# Patient Record
Sex: Male | Born: 1945
Health system: Southern US, Community
[De-identification: ages and names within clinical notes are randomized; demographics above are authoritative.]

## PROBLEM LIST (undated history)

## (undated) DIAGNOSIS — D649 Anemia, unspecified: Secondary | ICD-10-CM

## (undated) DIAGNOSIS — F17200 Nicotine dependence, unspecified, uncomplicated: Secondary | ICD-10-CM

## (undated) DIAGNOSIS — I10 Essential (primary) hypertension: Secondary | ICD-10-CM

## (undated) DIAGNOSIS — J449 Chronic obstructive pulmonary disease, unspecified: Secondary | ICD-10-CM

## (undated) DIAGNOSIS — E7800 Pure hypercholesterolemia, unspecified: Secondary | ICD-10-CM

## (undated) HISTORY — DX: Nicotine dependence, unspecified, uncomplicated: F17.200

## (undated) HISTORY — DX: Essential (primary) hypertension: I10

## (undated) HISTORY — DX: Chronic obstructive pulmonary disease, unspecified: J44.9

---

## 2010-12-03 ENCOUNTER — Other Ambulatory Visit: Payer: Self-pay | Admitting: Family Medicine

## 2010-12-03 DIAGNOSIS — I712 Thoracic aortic aneurysm, without rupture: Secondary | ICD-10-CM

## 2010-12-06 ENCOUNTER — Ambulatory Visit
Admission: RE | Admit: 2010-12-06 | Discharge: 2010-12-06 | Disposition: A | Discharge status: Home / Self Care | Payer: Medicare Other | Source: Ambulatory Visit | Attending: Family Medicine | Admitting: Family Medicine

## 2010-12-06 DIAGNOSIS — I712 Thoracic aortic aneurysm, without rupture: Secondary | ICD-10-CM

## 2010-12-06 MED ORDER — IOHEXOL 300 MG/ML  SOLN
75.0000 mL | Freq: Once | INTRAMUSCULAR | Status: AC | PRN
  Administered 2010-12-06: 75 mL via INTRAVENOUS

## 2013-07-21 ENCOUNTER — Ambulatory Visit (INDEPENDENT_AMBULATORY_CARE_PROVIDER_SITE_OTHER): Payer: Medicare Other | Admitting: Family Medicine

## 2013-07-21 ENCOUNTER — Encounter: Payer: Self-pay | Admitting: Family Medicine

## 2013-07-21 VITALS — BP 220/110 | HR 74 | Temp 97.0°F | Resp 18 | Ht 67.0 in | Wt 186.0 lb

## 2013-07-21 DIAGNOSIS — I160 Hypertensive urgency: Secondary | ICD-10-CM

## 2013-07-21 DIAGNOSIS — F17200 Nicotine dependence, unspecified, uncomplicated: Secondary | ICD-10-CM | POA: Insufficient documentation

## 2013-07-21 DIAGNOSIS — I10 Essential (primary) hypertension: Secondary | ICD-10-CM

## 2013-07-21 MED ORDER — CLONIDINE HCL 0.1 MG PO TABS: 0.1000 mg | ORAL_TABLET | Freq: Two times a day (BID) | ORAL | Status: DC

## 2013-07-21 NOTE — Progress Notes (Signed)
Subjective:    Patient ID: Brett Vasquez, male    DOB: 09-28-1945, 67 y.o.   MRN: [id number]  HPI Patient is a pleasant 67 year old African American male whom I have not seen since 2012. He is not seen a doctor since 2013. He has a history of hypertension. He has been off his medication now for 4 months. He presents today for a scheduled physical exam. Unfortunately his blood pressure is 220/110. He denies any chest pain, shortness of breath, dyspnea on exertion. He denies any headache or blurred vision. He denies any altered mental status or neurologic deficits. He denies any hematuria or oliguria. He feels well. Past Medical History  Diagnosis Date  . COPD (chronic obstructive pulmonary disease)   . Hypertension   . Smoker unmotivated to quit    No current outpatient prescriptions on file prior to visit.   No current facility-administered medications on file prior to visit.   No Known Allergies History   Social History  . Marital Status: Legally Separated    Spouse Name: N/A    Number of Children: N/A  . Years of Education: N/A   Occupational History  . Not on file.   Social History Main Topics  . Smoking status: Current Every Day Smoker -- 1.50 packs/day for 50 years    Types: Cigarettes  . Smokeless tobacco: Not on file  . Alcohol Use: No  . Drug Use: No  . Sexual Activity: Not on file     Comment: divorced, 3 grown children   Other Topics Concern  . Not on file   Social History Narrative  . No narrative on file      Review of Systems  Respiratory: Negative for cough, choking, chest tightness and shortness of breath.   Cardiovascular: Negative for chest pain, palpitations and leg swelling.  Gastrointestinal: Positive for abdominal distention.  Neurological: Negative for dizziness, tremors, seizures, syncope, facial asymmetry, speech difficulty, weakness, light-headedness, numbness and headaches.  All other systems reviewed and are negative.        Objective:   Physical Exam  Vitals reviewed. Constitutional: He is oriented to person, place, and time. He appears well-developed and well-nourished. No distress.  Neck: Neck supple. No JVD present. No thyromegaly present.  Cardiovascular: Normal rate, regular rhythm, normal heart sounds and intact distal pulses.  Exam reveals no gallop and no friction rub.   No murmur heard. Pulmonary/Chest: Effort normal and breath sounds normal. No respiratory distress. He has no wheezes. He has no rales. He exhibits no tenderness.  Abdominal: Soft. Bowel sounds are normal. He exhibits no distension and no mass. There is no tenderness. There is no rebound and no guarding.  Neurological: He is alert and oriented to person, place, and time. He has normal reflexes. He displays normal reflexes. No cranial nerve deficit. He exhibits normal muscle tone. Coordination normal.  Skin: He is not diaphoretic.          Assessment & Plan:  1. Hypertensive urgency EKG shows sinus rhythm at 69 beats per minute with left axis deviation -31. Otherwise he has normal intervals. There nonspecific ST changes in the lateral leads and a left anterior fascicular block but no overt ischemia.  Begin clonidine 0.1 mg by mouth twice a day and recheck blood pressure in one week the emergency room if chest pain or shortness of breath or headaches develop.  His complete physical exam with the delayed until resolution of his blood pressure. He is also due for colonoscopy, prostate  exam, pneumonia vaccine, flu shot. I will also start the patient on Symbicort 160/4.52 puffs inhaled twice a day recheck on Monday.  - COMPLETE METABOLIC PANEL WITH GFR - CBC with Differential - Lipid panel - EKG 12-Lead

## 2013-07-22 LAB — CBC WITH DIFFERENTIAL/PLATELET
Hemoglobin: 13.7 g/dL (ref 13.0–17.0)
Lymphocytes Relative: 35 % (ref 12–46)
Lymphs Abs: 2.4 10*3/uL (ref 0.7–4.0)
Monocytes Relative: 6 % (ref 3–12)
Neutro Abs: 3.8 10*3/uL (ref 1.7–7.7)
Neutrophils Relative %: 55 % (ref 43–77)
Platelets: 289 10*3/uL (ref 150–400)
RBC: 4.67 MIL/uL (ref 4.22–5.81)
WBC: 6.9 10*3/uL (ref 4.0–10.5)

## 2013-07-22 LAB — COMPLETE METABOLIC PANEL WITH GFR
ALT: 10 U/L (ref 0–53)
BUN: 11 mg/dL (ref 6–23)
CO2: 27 mEq/L (ref 19–32)
Calcium: 9.1 mg/dL (ref 8.4–10.5)
Chloride: 106 mEq/L (ref 96–112)
Creat: 0.88 mg/dL (ref 0.50–1.35)
GFR, Est African American: >89 mL/min
Total Bilirubin: 0.5 mg/dL (ref 0.3–1.2)

## 2013-07-22 LAB — LIPID PANEL
Cholesterol: 168 mg/dL (ref 0–200)
HDL: 59 mg/dL (ref >39)
Total CHOL/HDL Ratio: 2.8 Ratio
Triglycerides: 100 mg/dL (ref <150)
VLDL: 20 mg/dL (ref 0–40)

## 2013-07-25 ENCOUNTER — Encounter: Payer: Self-pay | Admitting: Family Medicine

## 2013-07-25 ENCOUNTER — Ambulatory Visit (INDEPENDENT_AMBULATORY_CARE_PROVIDER_SITE_OTHER): Payer: Medicare Other | Admitting: Family Medicine

## 2013-07-25 VITALS — BP 150/100 | HR 66 | Temp 97.9°F | Resp 18 | Wt 182.0 lb

## 2013-07-25 DIAGNOSIS — I10 Essential (primary) hypertension: Secondary | ICD-10-CM

## 2013-07-25 DIAGNOSIS — R0689 Other abnormalities of breathing: Secondary | ICD-10-CM

## 2013-07-25 DIAGNOSIS — R0989 Other specified symptoms and signs involving the circulatory and respiratory systems: Secondary | ICD-10-CM

## 2013-07-25 MED ORDER — HYDROCHLOROTHIAZIDE 25 MG PO TABS: 25.0000 mg | ORAL_TABLET | Freq: Every day | ORAL | Status: DC

## 2013-07-25 NOTE — Progress Notes (Signed)
Subjective:    Patient ID: Brett Vasquez, male    DOB: [birth year], 67 y.o.   MRN: [id number]  HPI 07/21/13 Patient is a pleasant 67 year old African American male whom I have not seen since 2012. He is not seen a doctor since 2013. He has a history of hypertension. He has been off his medication now for 4 months. He presents today for a scheduled physical exam. Unfortunately his blood pressure is 220/110. He denies any chest pain, shortness of breath, dyspnea on exertion. He denies any headache or blurred vision. He denies any altered mental status or neurologic deficits. He denies any hematuria or oliguria. He feels well.  At that time, my plan was: 1. Hypertensive urgency EKG shows sinus rhythm at 69 beats per minute with left axis deviation -31. Otherwise he has normal intervals. There nonspecific ST changes in the lateral leads and a left anterior fascicular block but no overt ischemia.  Begin clonidine 0.1 mg by mouth twice a day and recheck blood pressure in one week the emergency room if chest pain or shortness of breath or headaches develop.  His complete physical exam with the delayed until resolution of his blood pressure. He is also due for colonoscopy, prostate exam, pneumonia vaccine, flu shot. I will also start the patient on Symbicort 160/4.52 puffs inhaled twice a day recheck on Monday.  - COMPLETE METABOLIC PANEL WITH GFR - CBC with Differential - Lipid panel - EKG 12-Lead  07/25/13 Patient is here today for followup he is blood pressures much better at 150/100 although it is still elevated. He does think his breathing is doing better since he started Symbicort. I reviewed the patient's labs with him in detail. His CMP, CBC, and fasting lipid panel were very good. They are listed below: Office Visit on 07/21/2013  Component Date Value Range Status  . Sodium 07/21/2013 141  135 - 145 mEq/L Final  . Potassium 07/21/2013 4.1  3.5 - 5.3 mEq/L Final  . Chloride 07/21/2013 106  96 -  112 mEq/L Final  . CO2 07/21/2013 27  19 - 32 mEq/L Final  . Glucose, Bld 07/21/2013 86  70 - 99 mg/dL Final  . BUN 40/98/1191 11  6 - 23 mg/dL Final  . Creat 47/82/9562 0.88  0.50 - 1.35 mg/dL Final  . Total Bilirubin 07/21/2013 0.5  0.3 - 1.2 mg/dL Final  . Alkaline Phosphatase 07/21/2013 88  39 - 117 U/L Final  . AST 07/21/2013 17  0 - 37 U/L Final  . ALT 07/21/2013 10  0 - 53 U/L Final  . Total Protein 07/21/2013 6.2  6.0 - 8.3 g/dL Final  . Albumin 13/04/6577 4.1  3.5 - 5.2 g/dL Final  . Calcium 46/96/2952 9.1  8.4 - 10.5 mg/dL Final  . GFR, Est African American 07/21/2013 >89   Final  . GFR, Est Non African American 07/21/2013 88   Final   Comment:                            The estimated GFR is a calculation valid for adults (>=24 years old)                          that uses the CKD-EPI algorithm to adjust for age and sex. It is  not to be used for children, pregnant women, hospitalized patients,                             patients on dialysis, or with rapidly changing kidney function.                          According to the NKDEP, eGFR >89 is normal, 60-89 shows mild                          impairment, 30-59 shows moderate impairment, 15-29 shows severe                          impairment and <15 is ESRD.                             . WBC 07/21/2013 6.9  4.0 - 10.5 K/uL Final  . RBC 07/21/2013 4.67  4.22 - 5.81 MIL/uL Final  . Hemoglobin 07/21/2013 13.7  13.0 - 17.0 g/dL Final  . HCT 78/29/5621 41.3  39.0 - 52.0 % Final  . MCV 07/21/2013 88.4  78.0 - 100.0 fL Final  . MCH 07/21/2013 29.3  26.0 - 34.0 pg Final  . MCHC 07/21/2013 33.2  30.0 - 36.0 g/dL Final  . RDW 30/86/5784 15.1  11.5 - 15.5 % Final  . Platelets 07/21/2013 289  150 - 400 K/uL Final  . Neutrophils Relative % 07/21/2013 55  43 - 77 % Final  . Neutro Abs 07/21/2013 3.8  1.7 - 7.7 K/uL Final  . Lymphocytes Relative 07/21/2013 35  12 - 46 % Final  . Lymphs Abs 07/21/2013 2.4  0.7 -  4.0 K/uL Final  . Monocytes Relative 07/21/2013 6  3 - 12 % Final  . Monocytes Absolute 07/21/2013 0.4  0.1 - 1.0 K/uL Final  . Eosinophils Relative 07/21/2013 3  0 - 5 % Final  . Eosinophils Absolute 07/21/2013 0.2  0.0 - 0.7 K/uL Final  . Basophils Relative 07/21/2013 1  0 - 1 % Final  . Basophils Absolute 07/21/2013 0.1  0.0 - 0.1 K/uL Final  . Smear Review 07/21/2013 Criteria for review not met   Final  . Cholesterol 07/21/2013 168  0 - 200 mg/dL Final   Comment: ATP III Classification:                                < 200        mg/dL        Desirable                               200 - 239     mg/dL        Borderline High                               >= 240        mg/dL        High                             . Triglycerides 07/21/2013 100  <  150 mg/dL Final  . HDL 16/06/9603 59  >39 mg/dL Final  . Total CHOL/HDL Ratio 07/21/2013 2.8   Final  . VLDL 07/21/2013 20  0 - 40 mg/dL Final  . LDL Cholesterol 07/21/2013 89  0 - 99 mg/dL Final   Comment:                            Total Cholesterol/HDL Ratio:CHD Risk                                                 Coronary Heart Disease Risk Table                                                                 Men       Women                                   1/2 Average Risk              3.4        3.3                                       Average Risk              5.0        4.4                                    2X Average Risk              9.6        7.1                                    3X Average Risk             23.4       11.0                          Use the calculated Patient Ratio above and the CHD Risk table                           to determine the patient's CHD Risk.                          ATP III Classification (LDL):                                < 100        mg/dL         Optimal  100 - 129     mg/dL         Near or Above Optimal                               130 - 159     mg/dL          Borderline High                               160 - 189     mg/dL         High                                > 190        mg/dL         Very High                                Past Medical History  Diagnosis Date  . COPD (chronic obstructive pulmonary disease)   . Hypertension   . Smoker unmotivated to quit    Current Outpatient Prescriptions on File Prior to Visit  Medication Sig Dispense Refill  . cloNIDine (CATAPRES) 0.1 MG tablet Take 1 tablet (0.1 mg total) by mouth 2 (two) times daily.  60 tablet  3   No current facility-administered medications on file prior to visit.   No Known Allergies History   Social History  . Marital Status: Legally Separated    Spouse Name: N/A    Number of Children: N/A  . Years of Education: N/A   Occupational History  . Not on file.   Social History Main Topics  . Smoking status: Current Every Day Smoker -- 1.50 packs/day for 50 years    Types: Cigarettes  . Smokeless tobacco: Not on file  . Alcohol Use: No  . Drug Use: No  . Sexual Activity: Not on file     Comment: divorced, 3 grown children   Other Topics Concern  . Not on file   Social History Narrative  . No narrative on file      Review of Systems  Respiratory: Negative for cough, choking, chest tightness and shortness of breath.   Cardiovascular: Negative for chest pain, palpitations and leg swelling.  Gastrointestinal: Positive for abdominal distention.  Neurological: Negative for dizziness, tremors, seizures, syncope, facial asymmetry, speech difficulty, weakness, light-headedness, numbness and headaches.  All other systems reviewed and are negative.       Objective:   Physical Exam  Vitals reviewed. Constitutional: He is oriented to person, place, and time. He appears well-developed and well-nourished. No distress.  Neck: Neck supple. No JVD present. No thyromegaly present.  Cardiovascular: Normal rate, regular rhythm, normal heart sounds and intact distal  pulses.  Exam reveals no gallop and no friction rub.   No murmur heard. Pulmonary/Chest: Effort normal and breath sounds normal. No respiratory distress. He has no wheezes. He has no rales. He exhibits no tenderness.  Abdominal: Soft. Bowel sounds are normal. He exhibits no distension and no mass. There is no tenderness. There is no rebound and no guarding.  Neurological: He is alert and oriented to person, place, and time. He has normal reflexes. No cranial nerve deficit. He exhibits normal muscle tone. Coordination normal.  Skin: He is not diaphoretic.   patient has faint bibasilar crackles        Assessment & Plan:  1. HTN (hypertension) Add hydrochlorothiazide 25 mg by mouth daily 2 clonidine and recheck blood pressure in 2 weeks. Also in 2 weeks to give the patient his flu shot, Prevnar 13, and perform a prostate exam. - hydrochlorothiazide (HYDRODIURIL) 25 MG tablet; Take 1 tablet (25 mg total) by mouth daily.  Dispense: 90 tablet; Refill: 3  2. Abnormal respiratory sounds Given his chronic tobacco abuse, check a chest x-ray. - DG Chest 2 View; Future

## 2013-08-09 ENCOUNTER — Ambulatory Visit: Payer: Medicare Other | Admitting: Family Medicine

## 2014-06-06 ENCOUNTER — Telehealth: Payer: Self-pay | Admitting: Family Medicine

## 2014-06-06 NOTE — Telephone Encounter (Signed)
LM pt is needing to schedule [facility] AND CPE

## 2014-06-20 ENCOUNTER — Encounter: Payer: Self-pay | Admitting: Family Medicine

## 2014-06-20 ENCOUNTER — Telehealth: Payer: Self-pay | Admitting: Family Medicine

## 2014-06-20 NOTE — Telephone Encounter (Signed)
Letter sent to pt to call and schedule [facility] CPE °

## 2014-07-28 ENCOUNTER — Telehealth: Payer: Self-pay | Admitting: Family Medicine

## 2014-07-28 DIAGNOSIS — I10 Essential (primary) hypertension: Secondary | ICD-10-CM

## 2014-07-28 MED ORDER — HYDROCHLOROTHIAZIDE 25 MG PO TABS: 25.0000 mg | ORAL_TABLET | Freq: Every day | ORAL | Status: DC

## 2014-07-28 NOTE — Telephone Encounter (Signed)
[phone]  Pt has cpe set up for Dec 3rd but he is needing a refill on his      hydrochlorothiazide (HYDRODIURIL) 25 MG tablet    [facility] [address]

## 2014-07-28 NOTE — Telephone Encounter (Signed)
Med sent to pharm 

## 2014-08-17 ENCOUNTER — Ambulatory Visit (INDEPENDENT_AMBULATORY_CARE_PROVIDER_SITE_OTHER): Payer: Medicare Other | Admitting: Family Medicine

## 2014-08-17 ENCOUNTER — Encounter: Payer: Self-pay | Admitting: Family Medicine

## 2014-08-17 VITALS — BP 130/88 | HR 80 | Temp 97.5°F | Resp 18 | Ht 67.0 in | Wt 199.0 lb

## 2014-08-17 DIAGNOSIS — J439 Emphysema, unspecified: Secondary | ICD-10-CM

## 2014-08-17 DIAGNOSIS — Z0000 Encounter for general adult medical examination without abnormal findings: Secondary | ICD-10-CM

## 2014-08-17 MED ORDER — BUDESONIDE-FORMOTEROL FUMARATE 160-4.5 MCG/ACT IN AERO: 2.0000 | INHALATION_SPRAY | Freq: Two times a day (BID) | RESPIRATORY_TRACT | Status: DC

## 2014-08-17 NOTE — Progress Notes (Signed)
Subjective:    Patient ID: Brett Vasquez, male    DOB: 09-Apr-1946, 68 y.o.   MRN: [id number]  HPI Patient is here today for complete physical exam. He has never had a colonoscopy. He is overdue for a prostate exam. He is due for a flu shot, Pneumovax 23, and the shingles vaccine. He is also due for fasting blood work. His blood pressures well controlled today at 130/88. He is currently only taking hydrochlorothiazide 25 mg by mouth daily. He has been off the clonidine first over 6 months. He denies any chest pain shortness of breath or dyspnea on exertion. Unfortunately he continues to smoke. Patient saw substantial benefit when he took Symbicort 160/4.52 puffs inhaled twice a day for his emphysema. He would like to continue that prescription. Otherwise he has no concerns. Past Medical History  Diagnosis Date  . COPD (chronic obstructive pulmonary disease)   . Hypertension   . Smoker unmotivated to quit    No past surgical history on file. Current Outpatient Prescriptions on File Prior to Visit  Medication Sig Dispense Refill  . hydrochlorothiazide (HYDRODIURIL) 25 MG tablet Take 1 tablet (25 mg total) by mouth daily. 90 tablet 3  . cloNIDine (CATAPRES) 0.1 MG tablet Take 1 tablet (0.1 mg total) by mouth 2 (two) times daily. (Patient not taking: Reported on 08/17/2014) 60 tablet 3   No current facility-administered medications on file prior to visit.   No Known Allergies History   Social History  . Marital Status: Legally Separated    Spouse Name: N/A    Number of Children: N/A  . Years of Education: N/A   Occupational History  . Not on file.   Social History Main Topics  . Smoking status: Current Every Day Smoker -- 1.50 packs/day for 50 years    Types: Cigarettes  . Smokeless tobacco: Not on file  . Alcohol Use: No  . Drug Use: No  . Sexual Activity: Not on file     Comment: divorced, 3 grown children   Other Topics Concern  . Not on file   Social History Narrative    No family history on file.    Review of Systems  All other systems reviewed and are negative.      Objective:   Physical Exam  Constitutional: He is oriented to person, place, and time. He appears well-developed and well-nourished. No distress.  HENT:  Head: Normocephalic and atraumatic.  Right Ear: External ear normal.  Left Ear: External ear normal.  Nose: Nose normal.  Mouth/Throat: Oropharynx is clear and moist. No oropharyngeal exudate.  Eyes: Conjunctivae and EOM are normal. Pupils are equal, round, and reactive to light. Right eye exhibits no discharge. Left eye exhibits no discharge. No scleral icterus.  Neck: Normal range of motion. Neck supple. No JVD present. No tracheal deviation present. No thyromegaly present.  Cardiovascular: Normal rate, regular rhythm, normal heart sounds and intact distal pulses.  Exam reveals no gallop.   No murmur heard. Pulmonary/Chest: Effort normal. No stridor. No respiratory distress. He has decreased breath sounds. He has no wheezes. He has no rales.  Abdominal: Soft. Bowel sounds are normal. He exhibits no distension and no mass. There is no tenderness. There is no rebound and no guarding.  Musculoskeletal: Normal range of motion. He exhibits no edema or tenderness.  Lymphadenopathy:    He has no cervical adenopathy.  Neurological: He is alert and oriented to person, place, and time. He has normal reflexes. He displays normal  reflexes. No cranial nerve deficit. He exhibits normal muscle tone. Coordination normal.  Skin: Skin is warm. No rash noted. He is not diaphoretic. No erythema. No pallor.  Psychiatric: He has a normal mood and affect. His behavior is normal. Judgment and thought content normal.  Vitals reviewed.         Assessment & Plan:  Pulmonary emphysema, unspecified emphysema type - Plan: budesonide-formoterol (SYMBICORT) 160-4.5 MCG/ACT inhaler  Routine general medical examination at a health care facility - Plan:  Ambulatory referral to Gastroenterology  Patient's physical exam today is completely normal. He refuses a flu shot. He refuses a pneumonia vaccine. He is not interested in the shingles vaccine. He refuses a digital rectal exam. He states that he will allow me to schedule him for colonoscopy. I have asked the patient to return fasting for a CBC, CMP, fasting lipid panel, and PSA. I would also like to recheck his blood pressure when he comes in fasting to ensure that it remains well controlled on hydrochlorothiazide alone.  Also recommended smoking cessation but the patient is in the pre-contemplative phase.

## 2014-08-24 ENCOUNTER — Ambulatory Visit: Payer: Medicare Other | Admitting: *Deleted

## 2014-08-24 VITALS — BP 130/82

## 2014-08-24 DIAGNOSIS — I10 Essential (primary) hypertension: Secondary | ICD-10-CM

## 2014-09-19 ENCOUNTER — Encounter: Payer: Self-pay | Admitting: Family Medicine

## 2015-07-17 ENCOUNTER — Other Ambulatory Visit: Payer: Self-pay | Admitting: Family Medicine

## 2015-07-17 DIAGNOSIS — H2513 Age-related nuclear cataract, bilateral: Secondary | ICD-10-CM | POA: Diagnosis not present

## 2015-07-17 DIAGNOSIS — H26053 Posterior subcapsular polar infantile and juvenile cataract, bilateral: Secondary | ICD-10-CM | POA: Diagnosis not present

## 2015-07-17 DIAGNOSIS — H25033 Anterior subcapsular polar age-related cataract, bilateral: Secondary | ICD-10-CM | POA: Diagnosis not present

## 2015-07-18 NOTE — Telephone Encounter (Signed)
Refill appropriate and filled per protocol. 

## 2015-07-31 DIAGNOSIS — H02005 Unspecified entropion of left lower eyelid: Secondary | ICD-10-CM | POA: Diagnosis not present

## 2015-07-31 DIAGNOSIS — H01021 Squamous blepharitis right upper eyelid: Secondary | ICD-10-CM | POA: Diagnosis not present

## 2015-07-31 DIAGNOSIS — H02002 Unspecified entropion of right lower eyelid: Secondary | ICD-10-CM | POA: Diagnosis not present

## 2015-09-04 ENCOUNTER — Ambulatory Visit: Payer: Medicare Other | Admitting: Family Medicine

## 2015-09-04 VITALS — BP 140/90

## 2015-09-04 DIAGNOSIS — J439 Emphysema, unspecified: Secondary | ICD-10-CM

## 2015-09-04 MED ORDER — BUDESONIDE-FORMOTEROL FUMARATE 160-4.5 MCG/ACT IN AERO: 2.0000 | INHALATION_SPRAY | Freq: Two times a day (BID) | RESPIRATORY_TRACT | Status: DC

## 2015-09-11 ENCOUNTER — Other Ambulatory Visit: Payer: Medicaid Other

## 2015-09-11 ENCOUNTER — Other Ambulatory Visit: Payer: Self-pay | Admitting: Family Medicine

## 2015-09-11 DIAGNOSIS — F17210 Nicotine dependence, cigarettes, uncomplicated: Secondary | ICD-10-CM | POA: Diagnosis not present

## 2015-09-11 DIAGNOSIS — I10 Essential (primary) hypertension: Secondary | ICD-10-CM

## 2015-09-11 DIAGNOSIS — Z125 Encounter for screening for malignant neoplasm of prostate: Secondary | ICD-10-CM

## 2015-09-11 DIAGNOSIS — Z0000 Encounter for general adult medical examination without abnormal findings: Secondary | ICD-10-CM | POA: Diagnosis not present

## 2015-09-11 DIAGNOSIS — Z79899 Other long term (current) drug therapy: Secondary | ICD-10-CM

## 2015-09-11 DIAGNOSIS — F17200 Nicotine dependence, unspecified, uncomplicated: Secondary | ICD-10-CM

## 2015-09-11 LAB — COMPLETE METABOLIC PANEL WITH GFR
ALBUMIN: 4 g/dL (ref 3.6–5.1)
ALK PHOS: 85 U/L (ref 40–115)
ALT: 15 U/L (ref 9–46)
AST: 17 U/L (ref 10–35)
BUN: 15 mg/dL (ref 7–25)
CALCIUM: 10.3 mg/dL (ref 8.6–10.3)
CO2: 26 mmol/L (ref 20–31)
CREATININE: 1.18 mg/dL (ref 0.70–1.18)
Chloride: 98 mmol/L (ref 98–110)
GFR, Est African American: 71 mL/min (ref >=60)
GFR, Est Non African American: 62 mL/min (ref >=60)
GLUCOSE: 89 mg/dL (ref 70–99)
Potassium: 3.6 mmol/L (ref 3.5–5.3)
Sodium: 140 mmol/L (ref 135–146)
TOTAL PROTEIN: 6.6 g/dL (ref 6.1–8.1)
Total Bilirubin: 0.7 mg/dL (ref 0.2–1.2)

## 2015-09-11 LAB — CBC WITH DIFFERENTIAL/PLATELET
BASOS ABS: 0.1 10*3/uL (ref 0.0–0.1)
Basophils Relative: 1 % (ref 0–1)
EOS PCT: 2 % (ref 0–5)
Eosinophils Absolute: 0.2 10*3/uL (ref 0.0–0.7)
HEMATOCRIT: 46.8 % (ref 39.0–52.0)
HEMOGLOBIN: 15.7 g/dL (ref 13.0–17.0)
LYMPHS ABS: 2.8 10*3/uL (ref 0.7–4.0)
LYMPHS PCT: 32 % (ref 12–46)
MCH: 29.5 pg (ref 26.0–34.0)
MCHC: 33.5 g/dL (ref 30.0–36.0)
MCV: 88 fL (ref 78.0–100.0)
MPV: 9.1 fL (ref 8.6–12.4)
Monocytes Absolute: 0.6 10*3/uL (ref 0.1–1.0)
Monocytes Relative: 7 % (ref 3–12)
NEUTROS ABS: 5.1 10*3/uL (ref 1.7–7.7)
Neutrophils Relative %: 58 % (ref 43–77)
Platelets: 291 10*3/uL (ref 150–400)
RBC: 5.32 MIL/uL (ref 4.22–5.81)
RDW: 14.7 % (ref 11.5–15.5)
WBC: 8.8 10*3/uL (ref 4.0–10.5)

## 2015-09-11 LAB — LIPID PANEL
CHOL/HDL RATIO: 2.7 ratio (ref <=5.0)
CHOLESTEROL: 170 mg/dL (ref 125–200)
HDL: 63 mg/dL (ref >=40)
LDL Cholesterol: 71 mg/dL (ref <130)
Triglycerides: 181 mg/dL — ABNORMAL HIGH (ref <150)
VLDL: 36 mg/dL — ABNORMAL HIGH (ref <30)

## 2015-09-11 LAB — TSH: TSH: 1.158 u[IU]/mL (ref 0.350–4.500)

## 2015-09-12 LAB — PSA, MEDICARE: PSA: 1.59 ng/mL (ref <=4.00)

## 2015-09-13 ENCOUNTER — Encounter: Payer: Self-pay | Admitting: Family Medicine

## 2015-09-13 ENCOUNTER — Ambulatory Visit (INDEPENDENT_AMBULATORY_CARE_PROVIDER_SITE_OTHER): Payer: Medicare Other | Admitting: Family Medicine

## 2015-09-13 VITALS — BP 152/98 | HR 78 | Temp 97.9°F | Resp 18 | Ht 67.0 in | Wt 195.0 lb

## 2015-09-13 DIAGNOSIS — I10 Essential (primary) hypertension: Secondary | ICD-10-CM | POA: Diagnosis not present

## 2015-09-13 DIAGNOSIS — Z23 Encounter for immunization: Secondary | ICD-10-CM

## 2015-09-13 DIAGNOSIS — Z0000 Encounter for general adult medical examination without abnormal findings: Secondary | ICD-10-CM | POA: Diagnosis not present

## 2015-09-13 MED ORDER — LOSARTAN POTASSIUM 100 MG PO TABS: 100.0000 mg | ORAL_TABLET | Freq: Every day | ORAL | Status: DC

## 2015-09-13 NOTE — Addendum Note (Signed)
Addended by: Shary Decamp B on: 09/13/2015 03:52 PM   Modules accepted: Orders

## 2015-09-13 NOTE — Progress Notes (Signed)
Subjective:    Patient ID: Brett Vasquez, male    DOB: Jun 10, 1946, 69 y.o.   MRN: [id number]  HPI  Patient is here today for complete physical exam. He is overdue for a flu shot, pneumonia vaccine, shingles vaccine. He refuses the flu shot and the shingles vaccine but he is willing to consent to Pneumovax 23. He is overdue for a colonoscopy which he refuses. However we discussed this at great length today and he states that he will consider further. He declines a digital rectal exam however his PSA was reassuring. His blood pressure today is elevated at 152/98. However he denies any other complaints Past Medical History  Diagnosis Date  . COPD (chronic obstructive pulmonary disease) ([place])   . Hypertension   . Smoker unmotivated to quit    No past surgical history on file. Current Outpatient Prescriptions on File Prior to Visit  Medication Sig Dispense Refill  . budesonide-formoterol (SYMBICORT) 160-4.5 MCG/ACT inhaler Inhale 2 puffs into the lungs 2 (two) times daily. 1 Inhaler 11  . hydrochlorothiazide (HYDRODIURIL) 25 MG tablet TAKE 1 TABLET (25 MG TOTAL) BY MOUTH DAILY. 90 tablet 0   No current facility-administered medications on file prior to visit.   No Known Allergies Social History   Social History  . Marital Status: Legally Separated    Spouse Name: N/A  . Number of Children: N/A  . Years of Education: N/A   Occupational History  . Not on file.   Social History Main Topics  . Smoking status: Current Every Day Smoker -- 1.50 packs/day for 50 years    Types: Cigarettes  . Smokeless tobacco: Not on file  . Alcohol Use: No  . Drug Use: No  . Sexual Activity: Not on file     Comment: divorced, 3 grown children   Other Topics Concern  . Not on file   Social History Narrative   No family history on file.    Review of Systems  All other systems reviewed and are negative.      Objective:   Physical Exam  Constitutional: He is oriented to person, place, and  time. He appears well-developed and well-nourished. No distress.  HENT:  Head: Normocephalic and atraumatic.  Right Ear: External ear normal.  Left Ear: External ear normal.  Nose: Nose normal.  Mouth/Throat: Oropharynx is clear and moist. No oropharyngeal exudate.  Eyes: Conjunctivae and EOM are normal. Pupils are equal, round, and reactive to light. Right eye exhibits no discharge. Left eye exhibits no discharge. No scleral icterus.  Neck: Normal range of motion. Neck supple. No JVD present. No tracheal deviation present. No thyromegaly present.  Cardiovascular: Normal rate, regular rhythm, normal heart sounds and intact distal pulses.  Exam reveals no gallop.   No murmur heard. Pulmonary/Chest: Effort normal. No stridor. No respiratory distress. He has decreased breath sounds. He has no wheezes. He has no rales.  Abdominal: Soft. Bowel sounds are normal. He exhibits no distension and no mass. There is no tenderness. There is no rebound and no guarding.  Musculoskeletal: Normal range of motion. He exhibits no edema or tenderness.  Lymphadenopathy:    He has no cervical adenopathy.  Neurological: He is alert and oriented to person, place, and time. He has normal reflexes. No cranial nerve deficit. He exhibits normal muscle tone. Coordination normal.  Skin: Skin is warm. No rash noted. He is not diaphoretic. No erythema. No pallor.  Psychiatric: He has a normal mood and affect. His behavior is  normal. Judgment and thought content normal.  Vitals reviewed.         Assessment & Plan:  Benign essential HTN - Plan: losartan (COZAAR) 100 MG tablet  Routine general medical examination at a health care facility  Patient's physical exam today is completely normal. He refuses a flu shot. However he allow me to give him Pneumovax 23. He is not interested in the shingles vaccine. He refuses a digital rectal exam. He will consider colonoscopy however he does not want me to schedule it at the  present time. I have reviewed his lab work which is listed below and all of which are excellent.   I have asked the patient to return in one month for recheck of his blood pressure after the addition of losartan 100 mg by mouth daily. I will again recommend a flu shot at that time and hopefully I can convince him then to receive it  Also recommended smoking cessation but the patient is in the pre-contemplative phase.

## 2016-05-02 ENCOUNTER — Telehealth: Payer: Self-pay | Admitting: Family Medicine

## 2016-05-02 NOTE — Telephone Encounter (Signed)
PATIENT SAYS LOSARTIN IS MAKING HIM DIZZY, PLEASE CALL AND ADVISE WHAT HE COULD TAKE INSTEAD  [phone] [facility]

## 2016-05-05 ENCOUNTER — Encounter: Payer: Self-pay | Admitting: Family Medicine

## 2016-05-05 ENCOUNTER — Ambulatory Visit (INDEPENDENT_AMBULATORY_CARE_PROVIDER_SITE_OTHER): Payer: Medicare Other | Admitting: Family Medicine

## 2016-05-05 VITALS — BP 180/110 | HR 80 | Temp 97.8°F | Resp 18 | Wt 192.0 lb

## 2016-05-05 DIAGNOSIS — I10 Essential (primary) hypertension: Secondary | ICD-10-CM | POA: Diagnosis not present

## 2016-05-05 MED ORDER — AMLODIPINE BESYLATE 10 MG PO TABS: 10.0000 mg | ORAL_TABLET | Freq: Every day | ORAL | 3 refills | Status: DC

## 2016-05-05 NOTE — Progress Notes (Signed)
Subjective:    Patient ID: Brett Vasquez, male    DOB: 08-06-1946, 70 y.o.   MRN: [id number]  HPI  12/16 Patient is here today for complete physical exam. He is overdue for a flu shot, pneumonia vaccine, shingles vaccine. He refuses the flu shot and the shingles vaccine but he is willing to consent to Pneumovax 23. He is overdue for a colonoscopy which he refuses. However we discussed this at great length today and he states that he will consider further. He declines a digital rectal exam however his PSA was reassuring. His blood pressure today is elevated at 152/98. However he denies any other complaints.  At that time, my plan was: Patient's physical exam today is completely normal. He refuses a flu shot. However he allow me to give him Pneumovax 23. He is not interested in the shingles vaccine. He refuses a digital rectal exam. He will consider colonoscopy however he does not want me to schedule it at the present time. I have reviewed his lab work which is listed below and all of which are excellent.   I have asked the patient to return in one month for recheck of his blood pressure after the addition of losartan 100 mg by mouth daily. I will again recommend a flu shot at that time and hopefully I can convince him then to receive it  Also recommended smoking cessation but the patient is in the pre-contemplative phase.  05/05/16 Patient has not been seen since that time. He called complaining of dizziness. He stated losartan was making him dizzy even though he been on the medication for 8 months. Therefore I asked the patient come in. He is actually stop losartan more than a week ago. His blood pressure today is extremely high. He denies any chest pain shortness of breath or dyspnea on exertion. He does admit to excessive salt use. He jokingly states that he puts salt on country ham. He also eats a lot of pork. He is compliant taking his hydrochlorothiazide but it is obviously insufficient to manage  his blood pressure. He is no longer on losartan Past Medical History:  Diagnosis Date  . COPD (chronic obstructive pulmonary disease) ([place])   . Hypertension   . Smoker unmotivated to quit    No past surgical history on file. Current Outpatient Prescriptions on File Prior to Visit  Medication Sig Dispense Refill  . budesonide-formoterol (SYMBICORT) 160-4.5 MCG/ACT inhaler Inhale 2 puffs into the lungs 2 (two) times daily. 1 Inhaler 11  . hydrochlorothiazide (HYDRODIURIL) 25 MG tablet TAKE 1 TABLET (25 MG TOTAL) BY MOUTH DAILY. 90 tablet 0  . losartan (COZAAR) 100 MG tablet Take 1 tablet (100 mg total) by mouth daily. (Patient not taking: Reported on 05/05/2016) 90 tablet 3   No current facility-administered medications on file prior to visit.    No Known Allergies Social History   Social History  . Marital status: Legally Separated    Spouse name: N/A  . Number of children: N/A  . Years of education: N/A   Occupational History  . Not on file.   Social History Main Topics  . Smoking status: Current Every Day Smoker    Packs/day: 1.50    Years: 50.00    Types: Cigarettes  . Smokeless tobacco: Not on file  . Alcohol use No  . Drug use: No  . Sexual activity: Not on file     Comment: divorced, 3 grown children   Other Topics Concern  .  Not on file   Social History Narrative  . No narrative on file   No family history on file.    Review of Systems  All other systems reviewed and are negative.      Objective:   Physical Exam  Constitutional: He is oriented to person, place, and time. He appears well-developed and well-nourished. No distress.  HENT:  Head: Normocephalic and atraumatic.  Right Ear: External ear normal.  Left Ear: External ear normal.  Nose: Nose normal.  Mouth/Throat: Oropharynx is clear and moist. No oropharyngeal exudate.  Eyes: Conjunctivae and EOM are normal. Pupils are equal, round, and reactive to light. Right eye exhibits no discharge. Left  eye exhibits no discharge. No scleral icterus.  Neck: Normal range of motion. Neck supple. No JVD present. No tracheal deviation present. No thyromegaly present.  Cardiovascular: Normal rate, regular rhythm, normal heart sounds and intact distal pulses.  Exam reveals no gallop.   No murmur heard. Pulmonary/Chest: Effort normal. No stridor. No respiratory distress. He has decreased breath sounds. He has no wheezes. He has no rales.  Abdominal: Soft. Bowel sounds are normal. He exhibits no distension and no mass. There is no tenderness. There is no rebound and no guarding.  Musculoskeletal: Normal range of motion. He exhibits no edema or tenderness.  Lymphadenopathy:    He has no cervical adenopathy.  Neurological: He is alert and oriented to person, place, and time. He has normal reflexes. No cranial nerve deficit. He exhibits normal muscle tone. Coordination normal.  Skin: Skin is warm. No rash noted. He is not diaphoretic. No erythema. No pallor.  Psychiatric: He has a normal mood and affect. His behavior is normal. Judgment and thought content normal.  Vitals reviewed.         Assessment & Plan:  Benign essential HTN - Plan: amLODipine (NORVASC) 10 MG tablet, CBC with Differential/Platelet, COMPLETE METABOLIC PANEL WITH GFR  Continue hydrochlorothiazide. Supplement with amlodipine 10 mg by mouth daily. Recheck blood pressure in one month. Monitor renal function and baseline lab work today

## 2016-05-05 NOTE — Telephone Encounter (Signed)
Has been on medicine since 12/16 and hasn't been seen since.  Doubt it would just now cause dizziness.  NTBS.

## 2016-05-06 LAB — COMPLETE METABOLIC PANEL WITH GFR
ALT: 11 U/L (ref 9–46)
AST: 18 U/L (ref 10–35)
Albumin: 4.4 g/dL (ref 3.6–5.1)
Alkaline Phosphatase: 93 U/L (ref 40–115)
BUN: 15 mg/dL (ref 7–25)
CALCIUM: 9.5 mg/dL (ref 8.6–10.3)
CHLORIDE: 103 mmol/L (ref 98–110)
CO2: 27 mmol/L (ref 20–31)
CREATININE: 1.18 mg/dL (ref 0.70–1.18)
GFR, EST AFRICAN AMERICAN: 71 mL/min (ref >=60)
GFR, Est Non African American: 61 mL/min (ref >=60)
Glucose, Bld: 76 mg/dL (ref 70–99)
POTASSIUM: 3.8 mmol/L (ref 3.5–5.3)
Sodium: 142 mmol/L (ref 135–146)
Total Bilirubin: 0.9 mg/dL (ref 0.2–1.2)
Total Protein: 7.1 g/dL (ref 6.1–8.1)

## 2016-05-06 LAB — CBC WITH DIFFERENTIAL/PLATELET
BASOS PCT: 1 %
Basophils Absolute: 75 cells/uL (ref 0–200)
Eosinophils Absolute: 150 cells/uL (ref 15–500)
Eosinophils Relative: 2 %
HCT: 45.4 % (ref 38.5–50.0)
Hemoglobin: 14.8 g/dL (ref 13.0–17.0)
LYMPHS PCT: 28 %
Lymphs Abs: 2100 cells/uL (ref 850–3900)
MCH: 28.7 pg (ref 27.0–33.0)
MCHC: 32.6 g/dL (ref 32.0–36.0)
MCV: 88 fL (ref 80.0–100.0)
MONOS PCT: 8 %
MPV: 9.6 fL (ref 7.5–12.5)
Monocytes Absolute: 600 cells/uL (ref 200–950)
NEUTROS PCT: 61 %
Neutro Abs: 4575 cells/uL (ref 1500–7800)
PLATELETS: 277 10*3/uL (ref 140–400)
RBC: 5.16 MIL/uL (ref 4.20–5.80)
RDW: 16.2 % — AB (ref 11.0–15.0)
WBC: 7.5 10*3/uL (ref 3.8–10.8)

## 2016-05-07 NOTE — Telephone Encounter (Signed)
Pt was seen in ov on 05/05/16

## 2016-06-05 ENCOUNTER — Ambulatory Visit (INDEPENDENT_AMBULATORY_CARE_PROVIDER_SITE_OTHER): Payer: Medicare Other | Admitting: Family Medicine

## 2016-06-05 ENCOUNTER — Encounter: Payer: Self-pay | Admitting: Family Medicine

## 2016-06-05 DIAGNOSIS — I10 Essential (primary) hypertension: Secondary | ICD-10-CM | POA: Diagnosis not present

## 2016-06-05 MED ORDER — HYDROCHLOROTHIAZIDE 25 MG PO TABS: ORAL_TABLET | ORAL | 3 refills | Status: DC

## 2016-06-05 MED ORDER — SILDENAFIL CITRATE 100 MG PO TABS: 50.0000 mg | ORAL_TABLET | Freq: Every day | ORAL | 11 refills | Status: DC | PRN

## 2016-06-05 MED ORDER — AMLODIPINE BESYLATE 10 MG PO TABS: 10.0000 mg | ORAL_TABLET | Freq: Every day | ORAL | 3 refills | Status: DC

## 2016-06-05 NOTE — Progress Notes (Signed)
Subjective:    Patient ID: Brett Vasquez, male    DOB: 11-Oct-1945, 70 y.o.   MRN: [id number]  HPI  12/16 Patient is here today for complete physical exam. He is overdue for a flu shot, pneumonia vaccine, shingles vaccine. He refuses the flu shot and the shingles vaccine but he is willing to consent to Pneumovax 23. He is overdue for a colonoscopy which he refuses. However we discussed this at great length today and he states that he will consider further. He declines a digital rectal exam however his PSA was reassuring. His blood pressure today is elevated at 152/98. However he denies any other complaints.  At that time, my plan was: Patient's physical exam today is completely normal. He refuses a flu shot. However he allow me to give him Pneumovax 23. He is not interested in the shingles vaccine. He refuses a digital rectal exam. He will consider colonoscopy however he does not want me to schedule it at the present time. I have reviewed his lab work which is listed below and all of which are excellent.   I have asked the patient to return in one month for recheck of his blood pressure after the addition of losartan 100 mg by mouth daily. I will again recommend a flu shot at that time and hopefully I can convince him then to receive it  Also recommended smoking cessation but the patient is in the pre-contemplative phase.  05/05/16 Patient has not been seen since that time. He called complaining of dizziness. He stated losartan was making him dizzy even though he been on the medication for 8 months. Therefore I asked the patient come in. He is actually stop losartan more than a week ago. His blood pressure today is extremely high. He denies any chest pain shortness of breath or dyspnea on exertion. He does admit to excessive salt use. He jokingly states that he puts salt on country ham. He also eats a lot of pork. He is compliant taking his hydrochlorothiazide but it is obviously insufficient to manage  his blood pressure. He is no longer on losartan.  At that time, my plan was: Continue hydrochlorothiazide. Supplement with amlodipine 10 mg by mouth daily. Recheck blood pressure in one month. Monitor renal function and baseline lab work today   06/05/16 Blood pressure is much better. He denies any side effects from amlodipine or the hydrochlorothiazide. He denies any chest pain shortness of breath or dyspnea on exertion. Past Medical History:  Diagnosis Date  . COPD (chronic obstructive pulmonary disease) ([place])   . Hypertension   . Smoker unmotivated to quit    No past surgical history on file. Current Outpatient Prescriptions on File Prior to Visit  Medication Sig Dispense Refill  . budesonide-formoterol (SYMBICORT) 160-4.5 MCG/ACT inhaler Inhale 2 puffs into the lungs 2 (two) times daily. 1 Inhaler 11   No current facility-administered medications on file prior to visit.    No Known Allergies Social History   Social History  . Marital status: Legally Separated    Spouse name: N/A  . Number of children: N/A  . Years of education: N/A   Occupational History  . Not on file.   Social History Main Topics  . Smoking status: Current Every Day Smoker    Packs/day: 1.50    Years: 50.00    Types: Cigarettes  . Smokeless tobacco: Not on file  . Alcohol use No  . Drug use: No  . Sexual activity: Not on  file     Comment: divorced, 3 grown children   Other Topics Concern  . Not on file   Social History Narrative  . No narrative on file   No family history on file.    Review of Systems  All other systems reviewed and are negative.      Objective:   Physical Exam  Constitutional: He is oriented to person, place, and time. He appears well-developed and well-nourished. No distress.  HENT:  Head: Normocephalic and atraumatic.  Right Ear: External ear normal.  Left Ear: External ear normal.  Nose: Nose normal.  Mouth/Throat: Oropharynx is clear and moist. No  oropharyngeal exudate.  Eyes: Conjunctivae and EOM are normal. Pupils are equal, round, and reactive to light. Right eye exhibits no discharge. Left eye exhibits no discharge. No scleral icterus.  Neck: Normal range of motion. Neck supple. No JVD present. No tracheal deviation present. No thyromegaly present.  Cardiovascular: Normal rate, regular rhythm, normal heart sounds and intact distal pulses.  Exam reveals no gallop.   No murmur heard. Pulmonary/Chest: Effort normal. No stridor. No respiratory distress. He has decreased breath sounds. He has no wheezes. He has no rales.  Abdominal: Soft. Bowel sounds are normal. He exhibits no distension and no mass. There is no tenderness. There is no rebound and no guarding.  Musculoskeletal: Normal range of motion. He exhibits no edema or tenderness.  Lymphadenopathy:    He has no cervical adenopathy.  Neurological: He is alert and oriented to person, place, and time. He has normal reflexes. No cranial nerve deficit. He exhibits normal muscle tone. Coordination normal.  Skin: Skin is warm. No rash noted. He is not diaphoretic. No erythema. No pallor.  Psychiatric: He has a normal mood and affect. His behavior is normal. Judgment and thought content normal.  Vitals reviewed.         Assessment & Plan:  Benign essential HTN - Plan: amLODipine (NORVASC) 10 MG tablet Blood pressure is now at goal. Continue amlodipine and hydrochlorothiazide. Return for physical exam in January

## 2016-06-07 ENCOUNTER — Encounter: Payer: Self-pay | Admitting: Family Medicine

## 2016-08-13 ENCOUNTER — Other Ambulatory Visit: Payer: Self-pay | Admitting: Family Medicine

## 2016-08-13 DIAGNOSIS — J439 Emphysema, unspecified: Secondary | ICD-10-CM

## 2016-08-13 NOTE — Telephone Encounter (Signed)
Medication refilled per protocol. 

## 2016-08-19 ENCOUNTER — Encounter: Payer: Self-pay | Admitting: Family Medicine

## 2016-09-16 ENCOUNTER — Other Ambulatory Visit: Payer: Self-pay | Admitting: *Deleted

## 2016-09-16 DIAGNOSIS — J439 Emphysema, unspecified: Secondary | ICD-10-CM

## 2016-09-16 MED ORDER — BUDESONIDE-FORMOTEROL FUMARATE 160-4.5 MCG/ACT IN AERO: 2.0000 | INHALATION_SPRAY | Freq: Two times a day (BID) | RESPIRATORY_TRACT | 3 refills | Status: DC

## 2016-09-16 NOTE — Telephone Encounter (Signed)
Received fax requesting refill on Symbicot.  Refill appropriate and filled per protocol.

## 2016-09-23 ENCOUNTER — Encounter: Payer: Self-pay | Admitting: Family Medicine

## 2017-05-08 ENCOUNTER — Telehealth: Payer: Self-pay | Admitting: Internal Medicine

## 2017-05-08 NOTE — Telephone Encounter (Signed)
Patient is on the 2018 [facility] Patient Reassign for the [facility] Priority list.  [facility] asks that we contact the patient because Dr. Verlene Mayer name is on the patient's ID card.  Patient has been seeing Dr. Jenna Luo, however, because Dr. Verlene Mayer name is on the card, we have to contact the patient in order to confirm if the patient wants to have Dr. Renold Genta as his PCP or not.    I spoke with the patient this morning and confirmed that he is in fact seeing Dr. Dennard Schaumann.  He has noticed that Dr. Renold Genta is on his card, and has been this entire time.  He does NOT want to change to Dr. Renold Genta, he wants to continue to see Dr. Dennard Schaumann.  I told him that I wanted to help him make that happen.  In order to do so, I'll make the change on our side to Dr. Dennard Schaumann and we need him to contact [facility] and request that they change it in their system to Dr. Dennard Schaumann.  And, they will then send him a new card with Dr. Dennard Schaumann on his card.  Patient stated that he would do that.    Call #1.

## 2017-05-23 ENCOUNTER — Other Ambulatory Visit: Payer: Self-pay | Admitting: Family Medicine

## 2017-07-20 ENCOUNTER — Other Ambulatory Visit: Payer: Self-pay | Admitting: Family Medicine

## 2017-07-20 DIAGNOSIS — I10 Essential (primary) hypertension: Secondary | ICD-10-CM

## 2017-09-20 ENCOUNTER — Other Ambulatory Visit: Payer: Self-pay | Admitting: Family Medicine

## 2017-09-20 DIAGNOSIS — J439 Emphysema, unspecified: Secondary | ICD-10-CM

## 2017-11-09 ENCOUNTER — Emergency Department (HOSPITAL_COMMUNITY): Payer: Medicare Other

## 2017-11-09 ENCOUNTER — Observation Stay (HOSPITAL_COMMUNITY)
Admission: EM | Admit: 2017-11-09 | Discharge: 2017-11-11 | Disposition: A | Discharge status: Home / Self Care | Payer: Medicare Other | Attending: Family Medicine | Admitting: Family Medicine

## 2017-11-09 ENCOUNTER — Observation Stay (HOSPITAL_COMMUNITY): Payer: Medicare Other

## 2017-11-09 DIAGNOSIS — Z79899 Other long term (current) drug therapy: Secondary | ICD-10-CM | POA: Insufficient documentation

## 2017-11-09 DIAGNOSIS — R4701 Aphasia: Secondary | ICD-10-CM | POA: Insufficient documentation

## 2017-11-09 DIAGNOSIS — R29810 Facial weakness: Secondary | ICD-10-CM | POA: Diagnosis not present

## 2017-11-09 DIAGNOSIS — I6789 Other cerebrovascular disease: Secondary | ICD-10-CM | POA: Diagnosis not present

## 2017-11-09 DIAGNOSIS — R4781 Slurred speech: Secondary | ICD-10-CM | POA: Diagnosis not present

## 2017-11-09 DIAGNOSIS — E872 Acidosis, unspecified: Secondary | ICD-10-CM | POA: Diagnosis present

## 2017-11-09 DIAGNOSIS — G459 Transient cerebral ischemic attack, unspecified: Principal | ICD-10-CM

## 2017-11-09 DIAGNOSIS — W19XXXA Unspecified fall, initial encounter: Secondary | ICD-10-CM | POA: Diagnosis not present

## 2017-11-09 DIAGNOSIS — Y92009 Unspecified place in unspecified non-institutional (private) residence as the place of occurrence of the external cause: Secondary | ICD-10-CM

## 2017-11-09 DIAGNOSIS — R569 Unspecified convulsions: Secondary | ICD-10-CM

## 2017-11-09 DIAGNOSIS — R4182 Altered mental status, unspecified: Secondary | ICD-10-CM | POA: Diagnosis present

## 2017-11-09 DIAGNOSIS — I444 Left anterior fascicular block: Secondary | ICD-10-CM | POA: Diagnosis not present

## 2017-11-09 DIAGNOSIS — R0602 Shortness of breath: Secondary | ICD-10-CM | POA: Diagnosis not present

## 2017-11-09 DIAGNOSIS — I10 Essential (primary) hypertension: Secondary | ICD-10-CM | POA: Diagnosis not present

## 2017-11-09 DIAGNOSIS — R29818 Other symptoms and signs involving the nervous system: Secondary | ICD-10-CM | POA: Diagnosis not present

## 2017-11-09 DIAGNOSIS — G9341 Metabolic encephalopathy: Secondary | ICD-10-CM | POA: Diagnosis not present

## 2017-11-09 DIAGNOSIS — J449 Chronic obstructive pulmonary disease, unspecified: Secondary | ICD-10-CM | POA: Diagnosis not present

## 2017-11-09 LAB — RAPID URINE DRUG SCREEN, HOSP PERFORMED
Amphetamines: NOT DETECTED
BENZODIAZEPINES: POSITIVE — AB
Barbiturates: NOT DETECTED
COCAINE: NOT DETECTED
OPIATES: NOT DETECTED
Tetrahydrocannabinol: NOT DETECTED

## 2017-11-09 LAB — COMPREHENSIVE METABOLIC PANEL
ALBUMIN: 4 g/dL (ref 3.5–5.0)
ALT: 14 U/L — ABNORMAL LOW (ref 17–63)
ANION GAP: 19 — AB (ref 5–15)
AST: 38 U/L (ref 15–41)
Alkaline Phosphatase: 101 U/L (ref 38–126)
BUN: 18 mg/dL (ref 6–20)
CO2: 15 mmol/L — ABNORMAL LOW (ref 22–32)
Calcium: 9.4 mg/dL (ref 8.9–10.3)
Chloride: 106 mmol/L (ref 101–111)
Creatinine, Ser: 1.37 mg/dL — ABNORMAL HIGH (ref 0.61–1.24)
GFR, EST AFRICAN AMERICAN: 57 mL/min — AB (ref >60)
GFR, EST NON AFRICAN AMERICAN: 50 mL/min — AB (ref >60)
GLUCOSE: 116 mg/dL — AB (ref 65–99)
POTASSIUM: 3.8 mmol/L (ref 3.5–5.1)
Sodium: 140 mmol/L (ref 135–145)
TOTAL PROTEIN: 6.5 g/dL (ref 6.5–8.1)
Total Bilirubin: 0.8 mg/dL (ref 0.3–1.2)

## 2017-11-09 LAB — I-STAT CHEM 8, ED
BUN: 19 mg/dL (ref 6–20)
Calcium, Ion: 1.09 mmol/L — ABNORMAL LOW (ref 1.15–1.40)
Chloride: 106 mmol/L (ref 101–111)
Creatinine, Ser: 1.1 mg/dL (ref 0.61–1.24)
Glucose, Bld: 107 mg/dL — ABNORMAL HIGH (ref 65–99)
HEMATOCRIT: 49 % (ref 39.0–52.0)
HEMOGLOBIN: 16.7 g/dL (ref 13.0–17.0)
POTASSIUM: 3.7 mmol/L (ref 3.5–5.1)
SODIUM: 141 mmol/L (ref 135–145)
TCO2: 18 mmol/L — ABNORMAL LOW (ref 22–32)

## 2017-11-09 LAB — PROTIME-INR
INR: 1.1
PROTHROMBIN TIME: 14.1 s (ref 11.4–15.2)

## 2017-11-09 LAB — APTT: aPTT: 32 seconds (ref 24–36)

## 2017-11-09 LAB — CBC
HCT: 46.6 % (ref 39.0–52.0)
HEMOGLOBIN: 15.3 g/dL (ref 13.0–17.0)
MCH: 29.3 pg (ref 26.0–34.0)
MCHC: 32.8 g/dL (ref 30.0–36.0)
MCV: 89.1 fL (ref 78.0–100.0)
PLATELETS: 257 10*3/uL (ref 150–400)
RBC: 5.23 MIL/uL (ref 4.22–5.81)
RDW: 14.5 % (ref 11.5–15.5)
WBC: 9.3 10*3/uL (ref 4.0–10.5)

## 2017-11-09 LAB — DIFFERENTIAL
Basophils Absolute: 0.1 10*3/uL (ref 0.0–0.1)
Basophils Relative: 1 %
EOS ABS: 0.2 10*3/uL (ref 0.0–0.7)
EOS PCT: 2 %
LYMPHS ABS: 3 10*3/uL (ref 0.7–4.0)
Lymphocytes Relative: 32 %
MONO ABS: 0.7 10*3/uL (ref 0.1–1.0)
Monocytes Relative: 7 %
NEUTROS PCT: 58 %
Neutro Abs: 5.3 10*3/uL (ref 1.7–7.7)

## 2017-11-09 LAB — ETHANOL

## 2017-11-09 LAB — AMMONIA: Ammonia: 27 umol/L (ref 9–35)

## 2017-11-09 LAB — CBG MONITORING, ED: GLUCOSE-CAPILLARY: 113 mg/dL — AB (ref 65–99)

## 2017-11-09 MED ORDER — ACETAMINOPHEN 650 MG RE SUPP: 650.0000 mg | RECTAL | Status: DC | PRN

## 2017-11-09 MED ORDER — ACETAMINOPHEN 160 MG/5ML PO SOLN: 650.0000 mg | ORAL | Status: DC | PRN

## 2017-11-09 MED ORDER — ENOXAPARIN SODIUM 40 MG/0.4ML ~~LOC~~ SOLN
40.0000 mg | SUBCUTANEOUS | Status: DC
  Administered 2017-11-09: 40 mg via SUBCUTANEOUS
  Filled 2017-11-09 (×3): qty 0.4

## 2017-11-09 MED ORDER — ASPIRIN 300 MG RE SUPP: 300.0000 mg | Freq: Every day | RECTAL | Status: DC

## 2017-11-09 MED ORDER — MOMETASONE FURO-FORMOTEROL FUM 200-5 MCG/ACT IN AERO
2.0000 | INHALATION_SPRAY | Freq: Two times a day (BID) | RESPIRATORY_TRACT | Status: DC
  Administered 2017-11-10 (×2): 2 via RESPIRATORY_TRACT
  Filled 2017-11-09: qty 8.8

## 2017-11-09 MED ORDER — ASPIRIN 325 MG PO TABS
325.0000 mg | ORAL_TABLET | Freq: Every day | ORAL | Status: DC
  Administered 2017-11-09 – 2017-11-10 (×2): 325 mg via ORAL
  Filled 2017-11-09 (×2): qty 1

## 2017-11-09 MED ORDER — SENNOSIDES-DOCUSATE SODIUM 8.6-50 MG PO TABS
1.0000 | ORAL_TABLET | Freq: Every evening | ORAL | Status: DC | PRN
Start: 2017-11-09 — End: 2017-11-11

## 2017-11-09 MED ORDER — STROKE: EARLY STAGES OF RECOVERY BOOK
Freq: Once | Status: AC
  Administered 2017-11-10: 04:00:00
  Filled 2017-11-09: qty 1

## 2017-11-09 MED ORDER — SODIUM CHLORIDE 0.9 % IV SOLN
INTRAVENOUS | Status: DC
  Administered 2017-11-09: 22:00:00 via INTRAVENOUS

## 2017-11-09 MED ORDER — ACETAMINOPHEN 325 MG PO TABS: 650.0000 mg | ORAL_TABLET | ORAL | Status: DC | PRN

## 2017-11-09 NOTE — H&P (Signed)
[facility] History and Physical   Patient: Brett Vasquez [id number]   PCP: Patient, No Pcp Per DOB: 02-24-1946   DOA: 11/09/2017   DOS: 11/09/2017   DOS: the patient was seen and examined on 11/09/2017  Patient coming from: The patient is coming from home.  Chief Complaint: fall  HPI: Brett Vasquez is a 72 y.o. male with Past medical history of HTN. Patient presented with a fall.  Patient lives at home with his brother.  At around 5:30 PM patient was last seen normal.  After that the brother heard a thud and went to see in the patient and found him on the floor.  Patient was not speaking and was confused.  He also was somewhat combative.  EMS was called at which time patient did have some arousal and was able to speak but had some slurred speech.  There was some reported facial droop as well. At the time of my evaluation patient was speaking clearly did not have any acute complaint.  No nausea no vomiting no fever no chills.  No abdominal pain no diarrhea recently reported.  No other change in medication reported.  ED Course: Patient was brought in as a code stroke.  CT scan of the head was performed which was negative for any acute abnormality.  Since his NIHSS was 3, patient was not felt a candidate for TPA and patient was referred for admission for further workup.  At his baseline ambulates without support And is independent for most of his ADL; manages his medication on his own.  Review of Systems: as mentioned in the history of present illness.  All other systems reviewed and are negative.  Past Medical History:  Diagnosis Date  . Hypertension    History reviewed. No pertinent surgical history. Social History:  has no tobacco, alcohol, and drug history on file.  No Known Allergies  No family history on file.   Prior to Admission medications   Medication Sig Start Date End Date Taking? Authorizing Provider  SYMBICORT 160-4.5 MCG/ACT inhaler Inhale 2 puffs into the  lungs 2 (two) times daily. 10/21/17  Yes [provider]  UNKNOWN TO PATIENT Blood Thinner???   Yes [provider]  amLODipine (NORVASC) 10 MG tablet Take 10 mg by mouth daily.    [provider]  hydrochlorothiazide (HYDRODIURIL) 25 MG tablet Take 25 mg by mouth daily.    [provider]    Physical Exam: Vitals:   11/09/17 1945 11/09/17 2015 11/09/17 2030 11/09/17 2227  BP: (!) 141/98 (!) 154/96 (!) 137/94 (!) 155/102  Pulse: 88 84 82 87  Resp: 19 (!) 22 (!) 25 (!) 26  Temp:    98.7 F (37.1 C)  TempSrc:    Oral  SpO2: 95% 96% 98% 96%  Weight:      Height:        General: Alert, Awake and Oriented to Time, Place and Person. Appear in mild distress, affect appropriate Eyes: PERRL, Conjunctiva normal ENT: Oral Mucosa clear moist. Neck: no JVD, no Abnormal Mass Or lumps Cardiovascular: S1 and S2 Present, no Murmur, Peripheral Pulses Present Respiratory: normal respiratory effort, Bilateral Air entry equal and Decreased, no use of accessory muscle, Clear to Auscultation, no Crackles, no wheezes Abdomen: Bowel Sound present, Soft and no tenderness, no hernia Skin: no redness, no Rash, no induration Extremities: no Pedal edema, no calf tenderness Neurologic: Grossly no focal neuro deficit. Bilaterally Equal motor strength  Labs on Admission:  CBC: Recent  Labs  Lab 11/09/17 1829 11/09/17 1833  WBC 9.3  --   NEUTROABS 5.3  --   HGB 15.3 16.7  HCT 46.6 49.0  MCV 89.1  --   PLT 257  --    Basic Metabolic Panel: Recent Labs  Lab 11/09/17 1829 11/09/17 1833  NA 140 141  K 3.8 3.7  CL 106 106  CO2 15*  --   GLUCOSE 116* 107*  BUN 18 19  CREATININE 1.37* 1.10  CALCIUM 9.4  --    GFR: Estimated Creatinine Clearance: 59.7 mL/min (by C-G formula based on SCr of 1.1 mg/dL). Liver Function Tests: Recent Labs  Lab 11/09/17 1829  AST 38  ALT 14*  ALKPHOS 101  BILITOT 0.8  PROT 6.5  ALBUMIN 4.0   No results for input(s): LIPASE,  AMYLASE in the last 168 hours. Recent Labs  Lab 11/09/17 1922  AMMONIA 27   Coagulation Profile: Recent Labs  Lab 11/09/17 1829  INR 1.10   Cardiac Enzymes: No results for input(s): CKTOTAL, CKMB, CKMBINDEX, TROPONINI in the last 168 hours. BNP (last 3 results) No results for input(s): PROBNP in the last 8760 hours. HbA1C: No results for input(s): HGBA1C in the last 72 hours. CBG: Recent Labs  Lab 11/09/17 1959  GLUCAP 113*   Lipid Profile: No results for input(s): CHOL, HDL, LDLCALC, TRIG, CHOLHDL, LDLDIRECT in the last 72 hours. Thyroid Function Tests: No results for input(s): TSH, T4TOTAL, FREET4, T3FREE, THYROIDAB in the last 72 hours. Anemia Panel: No results for input(s): VITAMINB12, FOLATE, FERRITIN, TIBC, IRON, RETICCTPCT in the last 72 hours. Urine analysis: No results found for: COLORURINE, APPEARANCEUR, LABSPEC, PHURINE, GLUCOSEU, HGBUR, BILIRUBINUR, KETONESUR, PROTEINUR, UROBILINOGEN, NITRITE, LEUKOCYTESUR  Radiological Exams on Admission: Dg Chest 2 View  Result Date: 11/09/2017 CLINICAL DATA:  Shortness of Breath EXAM: CHEST  2 VIEW COMPARISON:  None. FINDINGS: Cardiomegaly. Bibasilar atelectasis. No overt edema. No effusions. No acute bony abnormality. Mild hyperinflation. IMPRESSION: Cardiomegaly, mild hyperinflation. Bibasilar atelectasis. Electronically Signed   By: Rolm Baptise M.D.   On: 11/09/2017 21:51   Ct Head Code Stroke Wo Contrast  Result Date: 11/09/2017 CLINICAL DATA:  Code stroke.  Left facial droop and slurred speech EXAM: CT HEAD WITHOUT CONTRAST TECHNIQUE: Contiguous axial images were obtained from the base of the skull through the vertex without intravenous contrast. COMPARISON:  None. FINDINGS: Brain: No mass lesion or acute hemorrhage. No focal hypoattenuation of the basal ganglia or cortex to indicate infarcted tissue. No hydrocephalus or age advanced atrophy. Vascular: No hyperdense vessel. Atherosclerotic calcification of the internal  carotid arteries at the skull base. Skull: Normal visualized skull base, calvarium and extracranial soft tissues. Sinuses/Orbits: No sinus fluid levels or advanced mucosal thickening. No mastoid effusion. Normal orbits. ASPECTS [facility] Stroke Program Early CT Score) - Ganglionic level infarction (caudate, lentiform nuclei, internal capsule, insula, M1-M3 cortex): 7 - Supraganglionic infarction (M4-M6 cortex): 3 Total score (0-10 with 10 being normal): 10 IMPRESSION: 1. No acute hemorrhage or mass lesion. 2. ASPECTS is 10. These results were communicated to Dr. Karena Addison Aroor at 6:47 pm on 11/09/2017 by text page via the [facility] messaging system. Electronically Signed   By: Ulyses Jarred M.D.   On: 11/09/2017 18:48   EKG: Independently reviewed. normal sinus rhythm, nonspecific ST and T waves changes.  Assessment/Plan 1. TIA (transient ischemic attack) Pt presented with fall, confusion, aphasia and facial droop.  Currently feeling better.  CT head negative.  No major deficit on exam as well.  Neurology was  consulted.  Pt was felt not a candidate for TPA.  PTOT speech therapy consulted.  MRI brain and MRA ordered No antithrombotics before admission, now on aspirin Echocardiogram and carotid doppler also ordered.   2.  Essential hypertension. Currently allowing permissive hypertension. Monitor.  3.  Fall. Suspected seizure. Patient presentation actually appears to be more like a seizure episode versus syncope. Orthostatic vitals will be done. EEG will be checked. Monitor.  Nutrition: cardiac diet DVT Prophylaxis: subcutaneous Heparin  Advance goals of care discussion: full code   Consults: neurology  Family Communication: family was present at bedside, at the time of interview.  Opportunity was given to ask question and all questions were answered satisfactorily.  Disposition: Admitted as observation, telemetry unit. Likely to be discharged home, in 2 days.  Author: Berle Mull,  MD [facility] Pager: [phone] 11/09/2017  If 7PM-7AM, please contact night-coverage [email or web address] Password [email or web address]

## 2017-11-09 NOTE — ED Notes (Signed)
Text page sent to Dr. Posey Pronto per order to notify that patient passed swallow screen.

## 2017-11-09 NOTE — ED Provider Notes (Signed)
[facility]MENT Provider Note   CSN: [id number] Arrival date & time: 11/09/17  1831     History   Chief Complaint No chief complaint on file.   HPI Brett Vasquez is a 72 y.o. male.  HPI  72 year old male presents the emergency department history of chronic tobacco abuse otherwise no significant past medical history presents the emergency department last known known  1730 today.  The roommate heard a thump and found the patient unconscious with no witnessed seizure activities, negative at.  EMS arrived found the patient hemodynamically stable but combative/agitated was immediately given 5 mg of Versed.  Code stroke was activated and patient was immediately taken to [facility].  Neurology at bedside.    No past medical history on file.  There are no active problems to display for this patient.        Home Medications    Prior to Admission medications   Not on File    Family History No family history on file.  Social History Social History   Tobacco Use  . Smoking status: Not on file  Substance Use Topics  . Alcohol use: Not on file  . Drug use: Not on file     Allergies   Patient has no allergy information on record.   Review of Systems Review of Systems  Unable to perform ROS: Mental status change     Physical Exam Updated Vital Signs BP (!) 141/98   Pulse 88   Temp 98.4 F (36.9 C)   Resp 19   Ht 5\' 5"  (1.651 m)   Wt 84.2 kg (185 lb 9.6 oz)   SpO2 95%   BMI 30.89 kg/m   Physical Exam  Physical Exam Vitals:   11/09/17 1939 11/09/17 1945  BP:  (!) 141/98  Pulse:  88  Resp:  19  Temp: 98.4 F (36.9 C)   SpO2:  95%   Constitutional: Patient is in no acute distress Head: Normocephalic and atraumatic.  Eyes: Extraocular motion intact, no scleral icterus Neck: Supple without meningismus, mass, or overt JVD Respiratory: Effort normal and breath sounds normal. No respiratory distress. CV: Heart  regular rate and rhythm, no obvious murmurs.  Pulses +2 and symmetric Abdomen: Soft, non-tender, non-distended MSK: Extremities are atraumatic without deformity, ROM intact Skin: Warm, dry, intact Neuro: confused; GCS 13 (3/4/6); neg pronator drift; CN II-XII intact Psychiatric: Mood and affect are normal.  ED Treatments / Results  Labs (all labs ordered are listed, but only abnormal results are displayed) Labs Reviewed  COMPREHENSIVE METABOLIC PANEL - Abnormal; Notable for the following components:      Result Value   CO2 15 (*)    Glucose, Bld 116 (*)    Creatinine, Ser 1.37 (*)    ALT 14 (*)    GFR calc non Af Amer 50 (*)    GFR calc Af Amer 57 (*)    Anion gap 19 (*)    All other components within normal limits  RAPID URINE DRUG SCREEN, HOSP PERFORMED - Abnormal; Notable for the following components:   Benzodiazepines POSITIVE (*)    All other components within normal limits  CBG MONITORING, ED - Abnormal; Notable for the following components:   Glucose-Capillary 113 (*)    All other components within normal limits  I-STAT CHEM 8, ED - Abnormal; Notable for the following components:   Glucose, Bld 107 (*)    Calcium, Ion 1.09 (*)    TCO2 18 (*)  All other components within normal limits  PROTIME-INR  APTT  CBC  DIFFERENTIAL  ETHANOL  AMMONIA  I-STAT TROPONIN, ED    EKG  EKG Interpretation  Date/Time:  Monday November 09 2017 18:59:47 EST Ventricular Rate:  96 PR Interval:    QRS Duration: 88 QT Interval:  374 QTC Calculation: 473 R Axis:   -42 Text Interpretation:  Sinus rhythm Ventricular premature complex Left anterior fascicular block Probable anteroseptal infarct, old Borderline T abnormalities, inferior leads Confirmed by Noemi Chapel [phone]) on 11/09/2017 7:02:31 PM      EKG sinus rhythm with intermittent PVC otherwise no findings concerning for ACS Radiology Ct Head Code Stroke Wo Contrast  Result Date: 11/09/2017 CLINICAL DATA:  Code stroke.   Left facial droop and slurred speech EXAM: CT HEAD WITHOUT CONTRAST TECHNIQUE: Contiguous axial images were obtained from the base of the skull through the vertex without intravenous contrast. COMPARISON:  None. FINDINGS: Brain: No mass lesion or acute hemorrhage. No focal hypoattenuation of the basal ganglia or cortex to indicate infarcted tissue. No hydrocephalus or age advanced atrophy. Vascular: No hyperdense vessel. Atherosclerotic calcification of the internal carotid arteries at the skull base. Skull: Normal visualized skull base, calvarium and extracranial soft tissues. Sinuses/Orbits: No sinus fluid levels or advanced mucosal thickening. No mastoid effusion. Normal orbits. ASPECTS [facility] Stroke Program Early CT Score) - Ganglionic level infarction (caudate, lentiform nuclei, internal capsule, insula, M1-M3 cortex): 7 - Supraganglionic infarction (M4-M6 cortex): 3 Total score (0-10 with 10 being normal): 10 IMPRESSION: 1. No acute hemorrhage or mass lesion. 2. ASPECTS is 10. These results were communicated to Dr. Karena Addison Aroor at 6:47 pm on 11/09/2017 by text page via the [facility] messaging system. Electronically Signed   By: Ulyses Jarred M.D.   On: 11/09/2017 18:48    Procedures Procedures (including critical care time)  Medications Ordered in ED Medications - No data to display   Initial Impression / Assessment and Plan / ED Course  I have reviewed the triage vital signs and the nursing notes.  Pertinent labs & imaging results that were available during my care of the patient were reviewed by me and considered in my medical decision making (see chart for details).     72 year old male presents the emergency department history of chronic tobacco abuse otherwise no significant past medical history presents the emergency department last known known  1730 today.  The roommate heard a thump and found the patient unconscious with no witnessed seizure activities, negative at.  EMS arrived found  the patient hemodynamically stable but combative/agitated was immediately given 5 mg of Versed.  Code stroke was activated and patient was immediately taken to [place] [facility].  Neurology at bedside.  Patient arrived GCS 11 with interval improvement 13.  CT scan with no acute pathology.  Physical exam as annotated above with no evidence of focal deficits.  Patient still appears to be confused/possibly sedated from the Versed.  Spoke with neurology unclear TIA versus seizure.  Neurology recommends defer antiepileptic medication..  Review of labs shows no evidence of hypoglycemia, negative gross electrolyte imbalance, good renal function, negative coagulopathy with INR 1.10, ECG without findings concerning for ACS/arrhythmia and troponin x1 0.01,  negative leukocytosis, stable H/H not concerning for anemia, EtOH undetectable.  Plan admit hospitalist for TIA w/u EEG/MRI per neurology.     Final Clinical Impressions(s) / ED Diagnoses   Final diagnoses:  Altered mental status, unspecified altered mental status type    ED Discharge Orders  None       Willette Alma, DO 11/09/17 2030    Noemi Chapel, MD 11/10/17 [phone]

## 2017-11-09 NOTE — ED Notes (Signed)
Patient arrived from home via EMS. Patient was home,  Friend witnessed a loud sound and went to check on patient. He was lying on the floor. Last seen normal 17:30. Per EMS, patient was foaming and combative. Versed 5mg  IM given by EMS. Patient responsive to voice at arrival.

## 2017-11-09 NOTE — Code Documentation (Signed)
Patient arrived via EMS,  En route he had received 5mg  Versed for agitation. Patient with decreased LOC, but arousable and increasingly follow commands and reactive.  Stat labs and head CT done.  NIHSS 1 for slurred speech.  Dr Aroor at bedside to assess patient. Patient initially very difficult to understand but as he started to wake up he became more understandable. No Acute treatment

## 2017-11-09 NOTE — ED Notes (Signed)
Arrived to [facility] at this time.

## 2017-11-09 NOTE — Consult Note (Signed)
Requesting Physician: Dr. Sabra Heck    Chief Complaint: Confusion and slurred speech  History obtained from: Patient and Chart     HPI:                                                                                                                                       Brett Vasquez is an 72 y.o. male the past medical history of COPD, tobacco abuse hypertension presents to [facility] ER as a stroke alert for confusion/slurred speech and possible facial droop.  According to the brother, the patient was last seen normal around 5:30 PM. The brother later then heard a thud and went to see the patient and found him on the floor. He was not speaking, appeared confused, urinated on himself and seemed combative. EMS arrived on scene blood pressure was 657 systolic. Felt the patient had slurred speech and left facial droop and not answering questions. The patient was very combative and required 5 mg of Versed for him to stay stable. On arrival to emergency room patient underwent a head CT which showed no acute abnormalities including no evidence of subarachnoid hemorrhage/TBI/subdural hemorrhage.  Assessment patient did not have any focal neurological deficits, however appeared to be slightly confused as well as drowsy likely from receiving Versed en route. There is no obvious tongue bite noted.   Patient does have vascular risk factors including tobacco abuse, hypertension.   Date last known well: 2.2519 Time last known well: 5:30 PM tPA Given: no, minor deficits NIHSS: 3 Baseline MRS 0  PMH: COPD, HTN  No family history on file. Social History:  has no tobacco, alcohol, and drug history on file.  Allergies: No Known Allergies  Medications:                                                                                                                        I reviewed home medications   ROS:  14 systems reviewed and negative except above    Examination:                                                                                                      General: Appears well-developed and well-nourished.  Psych: Affect appropriate to situation Eyes: No scleral injection HENT: No OP obstrucion Head: Normocephalic.  Cardiovascular: Normal rate and regular rhythm.  Respiratory: Effort normal and breath sounds normal to anterior ascultation GI: Soft.  No distension. There is no tenderness.  Skin: WDI   Neurological Examination Mental Status: drowsy, oriented to himself, thought content appropriate.  Speech fluent without evidence of aphasia. Able to follow 3 step commands without difficulty. Cranial Nerves: II: Visual fields grossly normal,  III,IV, VI: ptosis not present, extra-ocular motions intact bilaterally, pupils equal, round, reactive to light and accommodation V,VII: smile symmetric, facial light touch sensation normal bilaterally VIII: hearing normal bilaterally IX,X: uvula rises symmetrically XI: bilateral shoulder shrug XII: midline tongue extension Motor: Right : Upper extremity   5/5    Left:     Upper extremity   5/5  Lower extremity   5/5     Lower extremity   5/5 Tone and bulk:normal tone throughout; no atrophy noted Sensory: Pinprick and light touch intact throughout, bilaterally Deep Tendon Reflexes: 2+ and symmetric throughout Plantars: Right: downgoing   Left: downgoing Cerebellar: normal finger-to-nose, normal rapid alternating movements and normal heel-to-shin test Gait: normal gait and station     Lab Results: Basic Metabolic Panel: Recent Labs  Lab 11/09/17 1829 11/09/17 1833  NA 140 141  K 3.8 3.7  CL 106 106  CO2 15*  --   GLUCOSE 116* 107*  BUN 18 19  CREATININE 1.37* 1.10  CALCIUM 9.4  --     CBC: Recent Labs  Lab 11/09/17 1829 11/09/17 1833  WBC 9.3  --   NEUTROABS 5.3  --   HGB 15.3 16.7  HCT  46.6 49.0  MCV 89.1  --   PLT 257  --     Coagulation Studies: Recent Labs    11/09/17 1829  LABPROT 14.1  INR 1.10    Imaging: Dg Chest 2 View  Result Date: 11/09/2017 CLINICAL DATA:  Shortness of Breath EXAM: CHEST  2 VIEW COMPARISON:  None. FINDINGS: Cardiomegaly. Bibasilar atelectasis. No overt edema. No effusions. No acute bony abnormality. Mild hyperinflation. IMPRESSION: Cardiomegaly, mild hyperinflation. Bibasilar atelectasis. Electronically Signed   By: Rolm Baptise M.D.   On: 11/09/2017 21:51   Ct Head Code Stroke Wo Contrast  Result Date: 11/09/2017 CLINICAL DATA:  Code stroke.  Left facial droop and slurred speech EXAM: CT HEAD WITHOUT CONTRAST TECHNIQUE: Contiguous axial images were obtained from the base of the skull through the vertex without intravenous contrast. COMPARISON:  None. FINDINGS: Brain: No mass lesion or acute hemorrhage. No focal hypoattenuation of the basal ganglia or cortex to indicate infarcted tissue. No hydrocephalus or age advanced atrophy. Vascular: No hyperdense vessel. Atherosclerotic calcification of the internal carotid arteries at the skull base. Skull: Normal visualized skull base, calvarium and extracranial  soft tissues. Sinuses/Orbits: No sinus fluid levels or advanced mucosal thickening. No mastoid effusion. Normal orbits. ASPECTS [facility] Stroke Program Early CT Score) - Ganglionic level infarction (caudate, lentiform nuclei, internal capsule, insula, M1-M3 cortex): 7 - Supraganglionic infarction (M4-M6 cortex): 3 Total score (0-10 with 10 being normal): 10 IMPRESSION: 1. No acute hemorrhage or mass lesion. 2. ASPECTS is 10. These results were communicated to Dr. Karena Addison Makala Fetterolf at 6:47 pm on 11/09/2017 by text page via the [facility] messaging system. Electronically Signed   By: Ulyses Jarred M.D.   On: 11/09/2017 18:48     ASSESSMENT AND PLAN   72 y.o. male the past medical history of COPD, tobacco abuse hypertension presents to [facility] ER as a stroke  alert for confusion/slurred speech and possible facial droop. Reason for description of event, seizure is high on the differential however patient also has multiple vascular risk factors and this will could be a TIA versus small stroke.    TIA/Acute stroke vs Seizure  Recommend # MRI of the brain without contrast #MRA Head and neck  #Transthoracic Echo  # Start patient on ASA 325mg  daily #Start or continue Atorvastatin 80 mg/other high intensity statin # BP goal: permissive HTN upto 629 systolic, PRNs above 476 # HBAIC and Lipid profile # Telemetry monitoring # Frequent neuro checks # NPO until passes stroke swallow screen # EEG  # Urine drug screen    Please page stroke NP  Or  PA  Or MD from 8am -4 pm  as this patient from this time will be  followed by the stroke.   You can look them up on [email or web address]  Password [facility]   [facility] Pager Number [phone]

## 2017-11-09 NOTE — ED Notes (Signed)
Family in waiting room 

## 2017-11-09 NOTE — ED Notes (Signed)
Bed alarm was placed on pt bed for safety, family at bed

## 2017-11-09 NOTE — ED Notes (Signed)
Patient transported to X-ray 

## 2017-11-09 NOTE — ED Notes (Signed)
Offered pt a [place] sandwich, pt did not want anything said he was not hungry

## 2017-11-09 NOTE — ED Provider Notes (Signed)
I saw and evaluated the patient, reviewed the resident's note and I agree with the findings and plan.  Pertinent History: The patient was seen at 6:45 PM on arrival to the emergency department by EMS.  The report from the brother who I personally interviewed was that the patient fell at home just prior to arrival.  He was found at the bedside sitting on the ground, obtunded, not making any sense, not following commands.  Earlier in the day the patient had been his normal self and had a discussion with the family member.  The patient at baseline can drive, cook and take care of himself entirely.  There is no history of stroke heart attack or prior seizures.  EMS reported that the patient had a normal blood sugar, he had no seizure activity for them but continued to be obtunded agitated and combative which gradually improved in route.  Pertinent Exam findings: On exam the patient initially was encephalopathic, he was responding to pain in all 4 extremities, his speech was somewhat slurred, he would not open his eyes.  Gradually the patient improved and by the time the CAT scan was finished which incidentally showed no signs of acute stroke the patient was seemingly back closer to his normal mental status with the ability to follow commands.  He is less combative and less agitated at this time.  The patient was seen by the neurologist, we appreciate his rapid evaluation.  He thinks that this was likely seizures but could have been a TIA or small stroke.  He recommends admission to the hospital for further inpatient workup including an MRI and EEG.   EKG Interpretation  Date/Time:  Monday November 09 2017 18:59:47 EST Ventricular Rate:  96 PR Interval:    QRS Duration: 88 QT Interval:  374 QTC Calculation: 473 R Axis:   -42 Text Interpretation:  Sinus rhythm Ventricular premature complex Left anterior fascicular block Probable anteroseptal infarct, old Borderline T abnormalities, inferior leads  Confirmed by Noemi Chapel [phone]) on 11/09/2017 7:02:31 PM Also confirmed by Noemi Chapel [phone]), editor Hattie Perch ([phone])  on 11/10/2017 7:23:29 AM        I personally interpreted the EKG as well as the resident and agree with the interpretation on the resident's chart.  Final diagnoses:  Altered mental status, unspecified altered mental status type      Noemi Chapel, MD 11/10/17 1354

## 2017-11-09 NOTE — ED Notes (Signed)
Assisted pt to the bathroom, pt is very unsteady on his feet while walking. Pt was assisted back to the bed and laid back down. This EMT told Rn about pt being unsteady on feet. Pt needs to use urinal not get out of bed.

## 2017-11-10 ENCOUNTER — Encounter (HOSPITAL_COMMUNITY): Payer: Self-pay | Admitting: Internal Medicine

## 2017-11-10 ENCOUNTER — Observation Stay (HOSPITAL_COMMUNITY): Payer: Medicare Other

## 2017-11-10 ENCOUNTER — Observation Stay (HOSPITAL_BASED_OUTPATIENT_CLINIC_OR_DEPARTMENT_OTHER): Payer: Medicare Other

## 2017-11-10 ENCOUNTER — Encounter (HOSPITAL_COMMUNITY): Payer: Medicare Other

## 2017-11-10 ENCOUNTER — Other Ambulatory Visit: Payer: Self-pay

## 2017-11-10 DIAGNOSIS — S0990XA Unspecified injury of head, initial encounter: Secondary | ICD-10-CM | POA: Diagnosis not present

## 2017-11-10 DIAGNOSIS — R4182 Altered mental status, unspecified: Secondary | ICD-10-CM

## 2017-11-10 DIAGNOSIS — E872 Acidosis, unspecified: Secondary | ICD-10-CM | POA: Diagnosis present

## 2017-11-10 DIAGNOSIS — R4701 Aphasia: Secondary | ICD-10-CM

## 2017-11-10 DIAGNOSIS — F17200 Nicotine dependence, unspecified, uncomplicated: Secondary | ICD-10-CM

## 2017-11-10 DIAGNOSIS — I351 Nonrheumatic aortic (valve) insufficiency: Secondary | ICD-10-CM

## 2017-11-10 DIAGNOSIS — Y92009 Unspecified place in unspecified non-institutional (private) residence as the place of occurrence of the external cause: Secondary | ICD-10-CM

## 2017-11-10 DIAGNOSIS — R0602 Shortness of breath: Secondary | ICD-10-CM

## 2017-11-10 DIAGNOSIS — I10 Essential (primary) hypertension: Secondary | ICD-10-CM

## 2017-11-10 DIAGNOSIS — R569 Unspecified convulsions: Secondary | ICD-10-CM

## 2017-11-10 DIAGNOSIS — W19XXXA Unspecified fall, initial encounter: Secondary | ICD-10-CM

## 2017-11-10 LAB — LACTIC ACID, PLASMA: Lactic Acid, Venous: 0.8 mmol/L (ref 0.5–1.9)

## 2017-11-10 LAB — ECHOCARDIOGRAM COMPLETE
AOASC: 41 cm
AVPHT: 820 ms
CHL CUP MV DEC (S): 197
E decel time: 197 msec
E/e' ratio: 9.01
FS: 30 % (ref 28–44)
Height: 66 in
IV/PV OW: 1
LA vol A4C: 40.4 ml
LA vol: 40.1 mL
LADIAMINDEX: 1.45 cm/m2
LASIZE: 29 mm
LAVOLIN: 20 mL/m2
LDCA: 3.14 cm2
LEFT ATRIUM END SYS DIAM: 29 mm
LV e' LATERAL: 5.17 cm/s
LVEEAVG: 9.01
LVEEMED: 9.01
LVOT diameter: 20 mm
MVAP: 3.79 cm2
MVPKAVEL: 58.2 m/s
MVPKEVEL: 46.6 m/s
MVSPHT: 58 ms
PW: 11 mm — AB (ref 0.6–1.1)
RV LATERAL S' VELOCITY: 14.2 cm/s
TAPSE: 28.7 mm
TDI e' lateral: 5.17
TDI e' medial: 4.38
Weight: 2966.51 oz

## 2017-11-10 LAB — CBC WITH DIFFERENTIAL/PLATELET
BASOS PCT: 1 %
Basophils Absolute: 0.1 10*3/uL (ref 0.0–0.1)
EOS ABS: 0.2 10*3/uL (ref 0.0–0.7)
Eosinophils Relative: 2 %
HEMATOCRIT: 43.5 % (ref 39.0–52.0)
HEMOGLOBIN: 14.3 g/dL (ref 13.0–17.0)
LYMPHS ABS: 2.6 10*3/uL (ref 0.7–4.0)
Lymphocytes Relative: 28 %
MCH: 28.9 pg (ref 26.0–34.0)
MCHC: 32.9 g/dL (ref 30.0–36.0)
MCV: 88.1 fL (ref 78.0–100.0)
MONO ABS: 0.8 10*3/uL (ref 0.1–1.0)
Monocytes Relative: 8 %
NEUTROS PCT: 61 %
Neutro Abs: 5.7 10*3/uL (ref 1.7–7.7)
Platelets: 227 10*3/uL (ref 150–400)
RBC: 4.94 MIL/uL (ref 4.22–5.81)
RDW: 14.3 % (ref 11.5–15.5)
WBC: 9.3 10*3/uL (ref 4.0–10.5)

## 2017-11-10 LAB — COMPREHENSIVE METABOLIC PANEL
ALK PHOS: 89 U/L (ref 38–126)
ALT: 14 U/L — ABNORMAL LOW (ref 17–63)
ANION GAP: 10 (ref 5–15)
AST: 25 U/L (ref 15–41)
Albumin: 3.4 g/dL — ABNORMAL LOW (ref 3.5–5.0)
BILIRUBIN TOTAL: 1.1 mg/dL (ref 0.3–1.2)
BUN: 15 mg/dL (ref 6–20)
CALCIUM: 9.1 mg/dL (ref 8.9–10.3)
CO2: 23 mmol/L (ref 22–32)
Chloride: 110 mmol/L (ref 101–111)
Creatinine, Ser: 1.09 mg/dL (ref 0.61–1.24)
GFR calc non Af Amer: >60 mL/min (ref >60)
Glucose, Bld: 84 mg/dL (ref 65–99)
POTASSIUM: 3.7 mmol/L (ref 3.5–5.1)
SODIUM: 143 mmol/L (ref 135–145)
TOTAL PROTEIN: 6 g/dL — AB (ref 6.5–8.1)

## 2017-11-10 LAB — LIPID PANEL
CHOLESTEROL: 156 mg/dL (ref 0–200)
HDL: 52 mg/dL (ref >40)
LDL CALC: 92 mg/dL (ref 0–99)
TRIGLYCERIDES: 60 mg/dL (ref <150)
Total CHOL/HDL Ratio: 3 RATIO
VLDL: 12 mg/dL (ref 0–40)

## 2017-11-10 LAB — POCT I-STAT TROPONIN I: Troponin i, poc: 0.01 ng/mL (ref 0.00–0.08)

## 2017-11-10 MED ORDER — GADOBENATE DIMEGLUMINE 529 MG/ML IV SOLN
20.0000 mL | Freq: Once | INTRAVENOUS | Status: AC
  Administered 2017-11-10: 17 mL via INTRAVENOUS

## 2017-11-10 MED ORDER — ATORVASTATIN CALCIUM 10 MG PO TABS
20.0000 mg | ORAL_TABLET | Freq: Every day | ORAL | Status: DC
  Administered 2017-11-10: 20 mg via ORAL
  Filled 2017-11-10: qty 2

## 2017-11-10 NOTE — Care Management Note (Signed)
Case Management Note  Patient Details  Name: [name] MRN: [id number] Date of Birth: 1946-01-23  Subjective/Objective:     Pt in with TIA. He is from home with family. He has no PcP listed.                Action/Plan: Pt refused PT and awaiting OT eval. CM following for d/c needs, physician orders.   Expected Discharge Date:                  Expected Discharge Plan:     In-House Referral:     Discharge planning Services     Post Acute Care Choice:    Choice offered to:     DME Arranged:    DME Agency:     HH Arranged:    HH Agency:     Status of Service:  In process, will continue to follow  If discussed at Long Length of Stay Meetings, dates discussed:    Additional Comments:  Pollie Friar, RN 11/10/2017, 1:12 PM

## 2017-11-10 NOTE — Progress Notes (Signed)
PROGRESS NOTE  Brett Vasquez VVO:[id number] DOB: 1946/08/01 DOA: 11/09/2017 PCP: Patient, No Pcp Per  HPI/Recap of past 24 hours: Brett Vasquez is a 72 y.o. male with Past medical history of HTN. Patient presented with a fall.  Patient lives at home with his brother.  At around 5:30 PM 11/09/17 patient was last seen normal.  After that the brother heard a thud and went to see in the patient and found him on the floor.  Patient was not speaking and was confused.  He also was somewhat combative.  EMS was called at which time patient did have some arousal and was able to speak but had some slurred speech.  There was some reported facial droop as well. ED Course: Patient was brought in as a code stroke.  CT scan of the head was performed which was negative for any acute abnormality.  Since his NIHSS was 3, patient was not felt a candidate for TPA and patient was referred for admission for further workup.  11/10/17: patient seen and examined with his son at his bedside. No new complaints.   Assessment/Plan: Principal Problem:   TIA (transient ischemic attack) Active Problems:   Essential hypertension   Metabolic acidosis   Aphasia   Fall at home, initial encounter   suspected Seizure ([place])  Acute metabolic encephalopathy 2/2 to TIA vs CVA vs seizures -encephalopathy has resolved - MRI 11/10/17: No acute intracranial abnormality. 2. MRA demonstrates generalized intracranial artery ectasia and tortuosity, but no arterial stenosis or occlusion. 3. Otherwise largely unremarkable for age MRI appearance of the Brain. -EEG unremarkable -carotid doppler pending -continue asa, statin -neurology following  HTN -allow permissive HTN 24-48hr -BP not to exceed 210/120  Fall -suspect seizures -continue orthosthatic vital signs -fall precaution -continue to [place]   Code Status: full  Family Communication: son at bedside   Disposition Plan: to be  determined   Consultants:  neurology  Procedures:  EEG, 2D echo   Antimicrobials:  none  DVT prophylaxis:  SCDs   Objective: Vitals:   11/09/17 2227 11/09/17 2300 11/10/17 0345 11/10/17 0645  BP: (!) 155/102 (!) 144/64 (!) 149/91 (!) 133/93  Pulse: 87 84 69 68  Resp: (!) 26 (!) 22 20 18   Temp: 98.7 F (37.1 C)  97.8 F (36.6 C) 97.9 F (36.6 C)  TempSrc: Oral  Oral Oral  SpO2: 96% 96% 99% 98%  Weight:   84.1 kg (185 lb 6.5 oz)   Height:   5\' 6"  (1.676 m)     Intake/Output Summary (Last 24 hours) at 11/10/2017 0753 Last data filed at 11/10/2017 0500 Gross per 24 hour  Intake 328.33 ml  Output 200 ml  Net 128.33 ml   Filed Weights   11/09/17 1901 11/10/17 0345  Weight: 84.2 kg (185 lb 9.6 oz) 84.1 kg (185 lb 6.5 oz)    Exam:   General:  72 yo AAM WD WN NAD A&O x 3   Cardiovascular: RRR no rubs or gallops  Respiratory: CTA no wheezes or rales  Abdomen: soft NT ND NBS x3   Musculoskeletal: no focal abnormalities   Skin: no rash  Psychiatry: Mood is anxious   Data Reviewed: CBC: Recent Labs  Lab 11/09/17 1829 11/09/17 1833 11/10/17 0521  WBC 9.3  --  9.3  NEUTROABS 5.3  --  5.7  HGB 15.3 16.7 14.3  HCT 46.6 49.0 43.5  MCV 89.1  --  88.1  PLT 257  --  227   Basic  Metabolic Panel: Recent Labs  Lab 11/09/17 1829 11/09/17 1833 11/10/17 0521  NA 140 141 143  K 3.8 3.7 3.7  CL 106 106 110  CO2 15*  --  23  GLUCOSE 116* 107* 84  BUN 18 19 15   CREATININE 1.37* 1.10 1.09  CALCIUM 9.4  --  9.1   GFR: Estimated Creatinine Clearance: 61.4 mL/min (by C-G formula based on SCr of 1.09 mg/dL). Liver Function Tests: Recent Labs  Lab 11/09/17 1829 11/10/17 0521  AST 38 25  ALT 14* 14*  ALKPHOS 101 89  BILITOT 0.8 1.1  PROT 6.5 6.0*  ALBUMIN 4.0 3.4*   No results for input(s): LIPASE, AMYLASE in the last 168 hours. Recent Labs  Lab 11/09/17 1922  AMMONIA 27   Coagulation Profile: Recent Labs  Lab 11/09/17 1829  INR 1.10    Cardiac Enzymes: No results for input(s): CKTOTAL, CKMB, CKMBINDEX, TROPONINI in the last 168 hours. BNP (last 3 results) No results for input(s): PROBNP in the last 8760 hours. HbA1C: No results for input(s): HGBA1C in the last 72 hours. CBG: Recent Labs  Lab 11/09/17 1959  GLUCAP 113*   Lipid Profile: Recent Labs    11/10/17 0521  CHOL 156  HDL 52  LDLCALC 92  TRIG 60  CHOLHDL 3.0   Thyroid Function Tests: No results for input(s): TSH, T4TOTAL, FREET4, T3FREE, THYROIDAB in the last 72 hours. Anemia Panel: No results for input(s): VITAMINB12, FOLATE, FERRITIN, TIBC, IRON, RETICCTPCT in the last 72 hours. Urine analysis: No results found for: COLORURINE, APPEARANCEUR, LABSPEC, PHURINE, GLUCOSEU, HGBUR, BILIRUBINUR, KETONESUR, PROTEINUR, UROBILINOGEN, NITRITE, LEUKOCYTESUR Sepsis Labs: @LABRCNTIP (procalcitonin:4,lacticidven:4)  )No results found for this or any previous visit (from the past 240 hour(s)).    Studies: Dg Chest 2 View  Result Date: 11/09/2017 CLINICAL DATA:  Shortness of Breath EXAM: CHEST  2 VIEW COMPARISON:  None. FINDINGS: Cardiomegaly. Bibasilar atelectasis. No overt edema. No effusions. No acute bony abnormality. Mild hyperinflation. IMPRESSION: Cardiomegaly, mild hyperinflation. Bibasilar atelectasis. Electronically Signed   By: Rolm Baptise M.D.   On: 11/09/2017 21:51   Ct Head Code Stroke Wo Contrast  Result Date: 11/09/2017 CLINICAL DATA:  Code stroke.  Left facial droop and slurred speech EXAM: CT HEAD WITHOUT CONTRAST TECHNIQUE: Contiguous axial images were obtained from the base of the skull through the vertex without intravenous contrast. COMPARISON:  None. FINDINGS: Brain: No mass lesion or acute hemorrhage. No focal hypoattenuation of the basal ganglia or cortex to indicate infarcted tissue. No hydrocephalus or age advanced atrophy. Vascular: No hyperdense vessel. Atherosclerotic calcification of the internal carotid arteries at the skull base.  Skull: Normal visualized skull base, calvarium and extracranial soft tissues. Sinuses/Orbits: No sinus fluid levels or advanced mucosal thickening. No mastoid effusion. Normal orbits. ASPECTS [facility] Stroke Program Early CT Score) - Ganglionic level infarction (caudate, lentiform nuclei, internal capsule, insula, M1-M3 cortex): 7 - Supraganglionic infarction (M4-M6 cortex): 3 Total score (0-10 with 10 being normal): 10 IMPRESSION: 1. No acute hemorrhage or mass lesion. 2. ASPECTS is 10. These results were communicated to Dr. Karena Addison Aroor at 6:47 pm on 11/09/2017 by text page via the [facility] messaging system. Electronically Signed   By: Ulyses Jarred M.D.   On: 11/09/2017 18:48    Scheduled Meds: . aspirin  300 mg Rectal Daily   Or  . aspirin  325 mg Oral Daily  . enoxaparin (LOVENOX) injection  40 mg Subcutaneous Q24H  . mometasone-formoterol  2 puff Inhalation BID    Continuous Infusions: .  sodium chloride 50 mL/hr at 11/09/17 2226     LOS: 0 days     Kayleen Memos, MD [facility] Pager [phone]  If 7PM-7AM, please contact night-coverage [email or web address] Password [facility] 11/10/2017, 7:53 AM

## 2017-11-10 NOTE — Progress Notes (Signed)
  Echocardiogram 2D Echocardiogram has been performed.  Brett Vasquez G Jessie Schrieber 11/10/2017, 10:52 AM

## 2017-11-10 NOTE — ED Notes (Signed)
[facility] David made aware of delays in transfer to unit.

## 2017-11-10 NOTE — Progress Notes (Addendum)
NEUROHOSPITALISTS STROKE TEAM - DAILY PROGRESS NOTE   ADMISSION HISTORY: Brett Vasquez is an 72 y.o. male the past medical history of COPD, tobacco abuse hypertension presents to [facility] ER as a stroke alert for confusion/slurred speech and possible facial droop.  According to the brother, the patient was last seen normal around 5:30 PM. The brother later then heard a thud and went to see the patient and found him on the floor. He was not speaking, appeared confused, urinated on himself and seemed combative. EMS arrived on scene blood pressure was 353 systolic. Felt the patient had slurred speech and left facial droop and not answering questions. The patient was very combative and required 5 mg of Versed for him to stay stable. On arrival to emergency room patient underwent a head CT which showed no acute abnormalities including no evidence of subarachnoid hemorrhage/TBI/subdural hemorrhage.   Assessment patient did not have any focal neurological deficits, however appeared to be slightly confused as well as drowsy likely from receiving Versed en route. There is no obvious tongue bite noted.      Patient does have vascular risk factors including tobacco abuse, hypertension.  Date last known well: 2.2519 Time last known well: 5:30 PM tPA Given: no, minor deficits NIHSS: 3 Baseline MRS 0  SUBJECTIVE (INTERVAL HISTORY) Son is at the bedside. Patient is found laying in bed in NAD. Overall he feels his condition is completely resolved. Voices no new complaints. No new events reported overnight. Description of admission symptoms more likely seizure versus syncope.  Patient describes events as the following: He drove home, went into the bathroom, came out into his bedroom and then he has no memory until he was admitted into the hospital. His brother heard him fall in his bedroom and EMS was called. No incontinence or tongue biting noted.  This morning he  has no complaints and is requesting to go home.   OBJECTIVE Lab Results: CBC:       Recent Labs  Lab 11/09/17 1829 11/09/17 1833 11/10/17 0521  WBC 9.3  --  9.3  HGB 15.3 16.7 14.3  HCT 46.6 49.0 43.5  MCV 89.1  --  88.1  PLT 257  --  227   BMP: Recent Labs  Lab 11/09/17 1829 11/09/17 1833 11/10/17 0521  NA 140 141 143  K 3.8 3.7 3.7  CL 106 106 110  CO2 15*  --  23  GLUCOSE 116* 107* 84  BUN 18 19 15   CREATININE 1.37* 1.10 1.09  CALCIUM 9.4  --  9.1   Liver Function Tests:  Recent Labs  Lab 11/09/17 1829 11/10/17 0521  AST 38 25  ALT 14* 14*  ALKPHOS 101 89  BILITOT 0.8 1.1  PROT 6.5 6.0*  ALBUMIN 4.0 3.4*   No results for input(s): LIPASE, AMYLASE in the last 168 hours. Recent Labs  Lab 11/09/17 1922  AMMONIA 27   Coagulation Studies:  Recent Labs    11/09/17 1829  APTT 32  INR 1.10   Urine Drug Screen:     Component Value Date/Time   LABOPIA NONE DETECTED 11/09/2017 1945   COCAINSCRNUR NONE DETECTED 11/09/2017 1945   LABBENZ POSITIVE (A) 11/09/2017 1945   AMPHETMU NONE DETECTED 11/09/2017 1945   THCU NONE DETECTED 11/09/2017 1945   LABBARB NONE DETECTED 11/09/2017 1945    Alcohol Level:  Recent Labs  Lab 11/09/17 1922  ETH <10    PHYSICAL EXAM Temp:  [97.8 F (36.6 C)-98.7 F (37.1 C)] 98.5  F (36.9 C) (02/26 1222) Pulse Rate:  [68-95] 69 (02/26 1222) Resp:  [18-26] 18 (02/26 1222) BP: (123-155)/(64-102) 140/86 (02/26 1222) SpO2:  [92 %-100 %] 95 % (02/26 1222) Weight:  [84.1 kg (185 lb 6.5 oz)-84.2 kg (185 lb 9.6 oz)] 84.1 kg (185 lb 6.5 oz) (02/26 0345) General - Well nourished, well developed, in no apparent distress HEENT-  Normocephalic,  Cardiovascular - Regular rate and rhythm  Respiratory - Lungs clear bilaterally. No wheezing. Abdomen - soft and non-tender, BS normal Extremities- no edema or cyanosis Neurological Examination Mental Status: drowsy, oriented to himself, thought content appropriate.  Speech fluent  without evidence of aphasia. Able to follow 3 step commands without difficulty. Cranial Nerves: II: Visual fields grossly normal,  III,IV, VI: ptosis not present, extra-ocular motions intact bilaterally, pupils equal, round, reactive to light and accommodation V,VII: smile symmetric, facial light touch sensation normal bilaterally VIII: hearing normal bilaterally IX,X: uvula rises symmetrically XI: bilateral shoulder shrug XII: midline tongue extension Motor: Right : Upper extremity   5/5    Left:     Upper extremity   5/5  Lower extremity   5/5     Lower extremity   5/5 Tone and bulk:normal tone throughout; no atrophy noted Sensory: Pinprick and light touch intact throughout, bilaterally Deep Tendon Reflexes: 2+ and symmetric throughout Plantars: Right: downgoing   Left: downgoing Cerebellar: normal finger-to-nose, normal rapid alternating movements and normal heel-to-shin test Gait: normal gait and station  IMAGING: I have personally reviewed the radiological images below and agree with the radiology interpretations. Dg Chest 2 View  Result Date: 11/09/2017 CLINICAL DATA:  Shortness of Breath EXAM: CHEST  2 VIEW COMPARISON:  None. FINDINGS: Cardiomegaly. Bibasilar atelectasis. No overt edema. No effusions. No acute bony abnormality. Mild hyperinflation. IMPRESSION: Cardiomegaly, mild hyperinflation. Bibasilar atelectasis. Electronically Signed   By: Rolm Baptise M.D.   On: 11/09/2017 21:51   Mr Jeri Cos LP Contrast  Result Date: 11/10/2017 CLINICAL DATA:  72 year old male status post unwitnessed fall. Found down with altered mental status, combative. EXAM: MRI HEAD WITHOUT AND WITH CONTRAST MRA HEAD WITHOUT CONTRAST TECHNIQUE: Multiplanar, multiecho pulse sequences of the brain and surrounding structures were obtained without and with intravenous contrast. Angiographic images of the head were obtained using MRA technique without contrast. CONTRAST:  35mL MULTIHANCE GADOBENATE  DIMEGLUMINE 529 MG/ML IV SOLN COMPARISON:  Head CT without contrast 11/09/2017. FINDINGS: MRI HEAD FINDINGS Brain: No restricted diffusion to suggest acute infarction. No midline shift, mass effect, evidence of mass lesion, ventriculomegaly, extra-axial collection or acute intracranial hemorrhage. Cervicomedullary junction and pituitary are within normal limits. Mild for age patchy periventricular white matter T2 and FLAIR hyperintensity. No cortical encephalomalacia or chronic cerebral blood products identified. The deep gray matter nuclei, brainstem, and cerebellum are normal for age. No abnormal enhancement identified. No dural thickening. Vascular: Major intracranial vascular flow voids are preserved with generalized intracranial artery dolichoectasia. The distal left vertebral artery appears dominant. Skull and upper cervical spine: Chronic disc degeneration in the cervical spine at C3-C4. Skull bone marrow signal is normal. Sinuses/Orbits: Orbits soft tissues appear normal. Paranasal sinuses are clear. Other: Mild mastoid effusions, greater on the right. Negative nasopharynx. Scalp and face soft tissues appear negative. MRA HEAD FINDINGS Antegrade flow in the posterior circulation with dominant distal left vertebral artery. Normal right PICA origin. Tortuous distal vertebral arteries without stenosis. Dolichoectatic basilar artery without stenosis. Normal SCA and PCA origins. Posterior communicating arteries are diminutive or absent. Tortuous bilateral P1 and P2  segments without stenosis. Antegrade flow in both ICA siphons. Tortuous cavernous ICAs. No siphon stenosis. Ectatic carotid termini with normal MCA and ACA origins. Generalized tortuosity of the bilateral MCA and ACA branches with no arterial stenosis. IMPRESSION: 1.  No acute intracranial abnormality. 2. MRA demonstrates generalized intracranial artery ectasia and tortuosity, but no arterial stenosis or occlusion. 3. Otherwise largely unremarkable for  age MRI appearance of the brain. Electronically Signed   By: Genevie Ann M.D.   On: 11/10/2017 09:15   Mr Virgel Paling XB Contrast  Result Date: 11/10/2017 CLINICAL DATA:  72 year old male status post unwitnessed fall. Found down with altered mental status, combative. EXAM: MRI HEAD WITHOUT AND WITH CONTRAST MRA HEAD WITHOUT CONTRAST TECHNIQUE: Multiplanar, multiecho pulse sequences of the brain and surrounding structures were obtained without and with intravenous contrast. Angiographic images of the head were obtained using MRA technique without contrast. CONTRAST:  56mL MULTIHANCE GADOBENATE DIMEGLUMINE 529 MG/ML IV SOLN COMPARISON:  Head CT without contrast 11/09/2017. FINDINGS: MRI HEAD FINDINGS Brain: No restricted diffusion to suggest acute infarction. No midline shift, mass effect, evidence of mass lesion, ventriculomegaly, extra-axial collection or acute intracranial hemorrhage. Cervicomedullary junction and pituitary are within normal limits. Mild for age patchy periventricular white matter T2 and FLAIR hyperintensity. No cortical encephalomalacia or chronic cerebral blood products identified. The deep gray matter nuclei, brainstem, and cerebellum are normal for age. No abnormal enhancement identified. No dural thickening. Vascular: Major intracranial vascular flow voids are preserved with generalized intracranial artery dolichoectasia. The distal left vertebral artery appears dominant. Skull and upper cervical spine: Chronic disc degeneration in the cervical spine at C3-C4. Skull bone marrow signal is normal. Sinuses/Orbits: Orbits soft tissues appear normal. Paranasal sinuses are clear. Other: Mild mastoid effusions, greater on the right. Negative nasopharynx. Scalp and face soft tissues appear negative. MRA HEAD FINDINGS Antegrade flow in the posterior circulation with dominant distal left vertebral artery. Normal right PICA origin. Tortuous distal vertebral arteries without stenosis. Dolichoectatic basilar  artery without stenosis. Normal SCA and PCA origins. Posterior communicating arteries are diminutive or absent. Tortuous bilateral P1 and P2 segments without stenosis. Antegrade flow in both ICA siphons. Tortuous cavernous ICAs. No siphon stenosis. Ectatic carotid termini with normal MCA and ACA origins. Generalized tortuosity of the bilateral MCA and ACA branches with no arterial stenosis. IMPRESSION: 1.  No acute intracranial abnormality. 2. MRA demonstrates generalized intracranial artery ectasia and tortuosity, but no arterial stenosis or occlusion. 3. Otherwise largely unremarkable for age MRI appearance of the brain. Electronically Signed   By: Genevie Ann M.D.   On: 11/10/2017 09:15   Ct Head Code Stroke Wo Contrast  Result Date: 11/09/2017 CLINICAL DATA:  Code stroke.  Left facial droop and slurred speech EXAM: CT HEAD WITHOUT CONTRAST TECHNIQUE: Contiguous axial images were obtained from the base of the skull through the vertex without intravenous contrast. COMPARISON:  None. FINDINGS: Brain: No mass lesion or acute hemorrhage. No focal hypoattenuation of the basal ganglia or cortex to indicate infarcted tissue. No hydrocephalus or age advanced atrophy. Vascular: No hyperdense vessel. Atherosclerotic calcification of the internal carotid arteries at the skull base. Skull: Normal visualized skull base, calvarium and extracranial soft tissues. Sinuses/Orbits: No sinus fluid levels or advanced mucosal thickening. No mastoid effusion. Normal orbits. ASPECTS [facility] Stroke Program Early CT Score) - Ganglionic level infarction (caudate, lentiform nuclei, internal capsule, insula, M1-M3 cortex): 7 - Supraganglionic infarction (M4-M6 cortex): 3 Total score (0-10 with 10 being normal): 10 IMPRESSION: 1. No acute hemorrhage or  mass lesion. 2. ASPECTS is 10. These results were communicated to Dr. Karena Addison Aroor at 6:47 pm on 11/09/2017 by text page via the [facility] messaging system. Electronically Signed   By: Ulyses Jarred M.D.   On: 11/09/2017 18:48   Echocardiogram:                                        Study Conclusions - Left ventricle: Systolic function was mildly reduced. The   estimated ejection fraction was in the range of 45% to 50%.   Hypokinesis of the basal-midinferolateral and inferior   myocardium; consistent with ischemia in the distribution of the   right coronary or left circumflex coronary artery. Doppler   parameters are consistent with abnormal left ventricular   relaxation (grade 1 diastolic dysfunction). - Aortic valve: There was mild to moderate regurgitation directed   centrally in the LVOT.  B/L Carotid U/S:                                                PENDING  EEG:                                                                    Impression: This EEG demonstrates normal sleep.  Due to compromise by muscle artifact, the EEG background during wakefulness could not be assessed      IMPRESSION: Brett Vasquez is a 72 y.o. male with PMH of COPD, tobacco abuse, hypertension presents to [facility] ER as a stroke alert for confusion/slurred speech and possible facial droop.Description of events, seizure is high on the differential.   TIA vs Seizure vs Syncopal Event   Suspected Etiology: small vessel Resultant Symptoms: confusion, slurred speech and ? Facial droop Stroke Risk Factors: hyperlipidemia and hypertension Other Stroke Risk Factors: Advanced age, Cigarette smoker Pre- obesity, Body mass index is 29.93 kg/m.   Outstanding Stroke Work-up Studies:     B/L Carotid U/S:                                                     PENDING  PLAN  11/10/2017: Continue Aspirin/ Statin Frequent neuro checks Telemetry monitoring PT/OT/SLP Consult Case Management /MSW Ongoing aggressive stroke risk factor management Patient counseled to be compliant with his antithrombotic medications Patient counseled on Lifestyle modifications including, Diet, Exercise, and Stress Follow  up with [facility] in 6 weeks  SEIZURES: EEG - Negative for seizure activity No seizure activity reported overnight Maintain Seizure precautions Patient instructed no driving until follow up with Neurology  HYPERTENSION: Stable Long term BP goal normotensive. May slowly restart home B/P medications, if necessary Home Meds: NONE  HYPERLIPIDEMIA:    Component Value Date/Time   CHOL 156 11/10/2017 0521   TRIG 60 11/10/2017 0521   HDL 52 11/10/2017 0521   CHOLHDL 3.0 11/10/2017 0521   VLDL  12 11/10/2017 0521   LDLCALC 92 11/10/2017 0521  Home Meds:  NONE LDL  goal < 70 Started on  Lipitor to 20 mg daily Continue statin at discharge  R/O DIABETES: No results found for: HGBA1C - PENDING Recent Labs  Lab 11/09/17 1959  GLUCAP 113*  HgbA1c goal < 7.0  TOBACCO ABUSE Current smoker Smoking cessation counseling provided Nicotine patch provided  PRE- OBESITY Obesity, Body mass index is 29.93 kg/m. Greater than/equal to 30  Other Active Problems: Principal Problem:   TIA (transient ischemic attack) Active Problems:   Essential hypertension   Metabolic acidosis   Aphasia   Fall at home, initial encounter   suspected Seizure [facility])    Hospital day # 0 VTE prophylaxis: Lovenox  Diet : Fall precautions Seizure precautions Aspiration precautions Diet Heart Room service appropriate? Yes; Fluid consistency: Thin   FAMILY UPDATES:family at bedside  TEAM UPDATES: Kayleen Memos, DO   Prior Home Stroke Medications:  No antithrombotic  Discharge Stroke Meds:  Please discharge patient on aspirin 325 mg daily   Disposition: Final discharge disposition not confirmed Therapy Recs:               PENDING Follow Up:  Follow-up Information    Dennie Bible, NP. Schedule an appointment as soon as possible for a visit in 6 week(s).   Specialty:  Family Medicine Contact information: [address] [place] [address] [phone]            Patient, No Pcp Per -PCP Follow up in 1-2 weeks   Case Management aware of need   Assessment & plan discussed with with attending physician and they are in agreement.    Mary Sella, ANP-C Stroke Neurology Team 11/10/2017 2:45 PM   11/10/2017 ATTENDING ASSESSMENT:   I reviewed above note and agree with the assessment and plan. I have made any additions or clarifications directly to the above note. Pt was seen and examined.   72 year old male with history of COPD, HTN, smoker admitted for confusion, found down on the floor, urinary incontinence and combative with EMS staff.  Received 5 mg Versed for agitation and combative behavior.  CT negative for acute abnormality.  MRI with and without contrast unremarkable, MRA head no stenosis or occlusion.  EEG normal, no seizure-like activity.  2D echo showed EF 45-50%.  Carotid Doppler pending.  LDL 92 and A1c pending.  On examination today, patient awake alert, restless and asking to be discharged home or go outside smoking.  Fully orientated, able to backward spelling world and good at calculation.  No apraxia, following three-step commands.  Moving all extremities, sensation symmetrical, coordination intact.  He is episode concerning for seizure due to confusion, urinary incontinence and combative postictal pattern, although TIA still in differential.  Recommend to continue aspirin 325 and Lipitor 20 on discharge.  No AED needed at this time.  If episode recurrent, may consider EGD at that time.  However, patient educated on no driving until seizure-free for 6 months according to [facility].  Seizure precautions at home.   Rosalin Hawking, MD PhD Stroke Neurology 11/10/2017 3:56 PM  To contact Stroke Continuity provider, please refer to [email or web address] After hours, contact General Neurology

## 2017-11-10 NOTE — Progress Notes (Signed)
SLP Cancellation Note  Patient Details Name: Brett Vasquez MRN: [id number] DOB: 01-27-1946   Cancelled treatment:       Reason Eval/Treat Not Completed: SLP screened, no needs identified, will sign off   Aneri Slagel 11/10/2017, 4:12 PM

## 2017-11-10 NOTE — ED Notes (Signed)
Attempted report x1 to 3W, no answer

## 2017-11-10 NOTE — Care Management Note (Signed)
Case Management Note  Patient Details  Name: BERTRAND VOWELS MRN: [id number] Date of Birth: 26-Aug-1946  Subjective/Objective:                    Action/Plan: CM consulted for outpatient therapy. CM met with the patient and his son and they are interested in [facility]. Orders in Epic and information on the AVS.  CM also consulted for PCP. CM spoke with pt and he saw Dr Dennard Schaumann in the last 2 years and would like to return to his office. If Pt d/c tonight, pts son will call and make an appointment. If pt d/c tomorrow. CM left in handoff for CM to call and schedule appt.  Son to provide transportation home when ready.  Expected Discharge Date:                  Expected Discharge Plan:  OP Rehab  In-House Referral:     Discharge planning Services  CM Consult  Post Acute Care Choice:    Choice offered to:     DME Arranged:    DME Agency:     HH Arranged:    [place] Agency:     Status of Service:  Completed, signed off  If discussed at [employer] of Stay Meetings, dates discussed:    Additional Comments:  Pollie Friar, RN 11/10/2017, 5:08 PM

## 2017-11-10 NOTE — Progress Notes (Signed)
PT Cancellation Note  Patient Details Name: Brett Vasquez MRN: [id number] DOB: 03/27/1946   Cancelled Treatment:    Reason Eval/Treat Not Completed: Patient declined, no reason specified pt working with OT and refused further assessment from PT due to wanting to nap. Will follow up as time allows.   Marguarite Arbour A Tylyn Derwin 11/10/2017, 12:07 PM Wray Kearns, [place], DPT [phone]

## 2017-11-10 NOTE — ED Notes (Signed)
Attempted report 

## 2017-11-10 NOTE — Progress Notes (Signed)
EEG complete - results pending 

## 2017-11-10 NOTE — Evaluation (Signed)
Physical Therapy Evaluation Patient Details Name: Brett Vasquez MRN: [id number] DOB: 1946/04/10 Today's Date: 11/10/2017   History of Present Illness  72 y.o. male with PMH of HTN presents with a fall. Pt lives with his brother and was found by brother and was not speaking and confused. MRI/MRA showing nothing acute. Admitted with possible seizures. NIH: 3 on admission due to slurred speech.   Clinical Impression  Patient presents with balance deficits, impaired safety awareness and impaired mobility s/p above. Tolerated gait training with close Min guard for safety as pt running into objects on the right side on numerous occasions. Pt completely unaware of this deficit.  PTA, pt independent and driving. Pt honorary and eager to return home. Might benefit from OPPT to improve higher level balance and safety awareness since pt is not functioning at baseline. Will follow acutely.     Follow Up Recommendations Outpatient PT;Supervision - Intermittent    Equipment Recommendations  None recommended by PT    Recommendations for Other Services       Precautions / Restrictions Precautions Precautions: Fall Restrictions Weight Bearing Restrictions: No      Mobility  Bed Mobility Overal bed mobility: Needs Assistance Bed Mobility: Supine to Sit     Supine to sit: Supervision Sit to supine: Supervision   General bed mobility comments: supervision for safety. Impulsive.   Transfers Overall transfer level: Needs assistance Equipment used: None Transfers: Sit to/from Stand Sit to Stand: Min guard         General transfer comment: Slow to rise with pt reaching for rail for support; impulsive to stand. Min guard to steady.   Ambulation/Gait Ambulation/Gait assistance: Min guard Ambulation Distance (Feet): 150 Feet Assistive device: None Gait Pattern/deviations: Step-through pattern;Decreased stride length;Staggering right;Drifts right/left Gait velocity: fast, unsafe Gait  velocity interpretation: at or above normal speed for age/gender General Gait Details: Fast, unsteady gait with pt veering towards right side and bumping into environment and objects on right side. No awareness whatsoever.   Stairs            Wheelchair Mobility    Modified Rankin (Stroke Patients Only) Modified Rankin (Stroke Patients Only) Pre-Morbid Rankin Score: Moderate disability Modified Rankin: Moderately severe disability     Balance Overall balance assessment: Needs assistance Sitting-balance support: No upper extremity supported;Feet supported Sitting balance-Leahy Scale: Fair     Standing balance support: During functional activity Standing balance-Leahy Scale: Fair Standing balance comment: Needing UE support at times.                              Pertinent Vitals/Pain Pain Assessment: No/denies pain    Home Living Family/patient expects to be discharged to:: Private residence Living Arrangements: Other relatives(brother) Available Help at Discharge: Family;Available PRN/intermittently Type of Home: House Home Access: Stairs to enter   [employer] of Steps: 2-3 Home Layout: One level Home Equipment: None      Prior Function Level of Independence: Independent         Comments: Drives.      Hand Dominance   Dominant Hand: Left    Extremity/Trunk Assessment   Upper Extremity Assessment Upper Extremity Assessment: Defer to OT evaluation    Lower Extremity Assessment Lower Extremity Assessment: Overall WFL for tasks assessed    Cervical / Trunk Assessment Cervical / Trunk Assessment: Normal  Communication   Communication: No difficulties  Cognition Arousal/Alertness: Awake/alert Behavior During Therapy: Impulsive(honorary) Overall Cognitive Status: Impaired/Different  from baseline Area of Impairment: Safety/judgement;Awareness                   Current Attention Level: Selective   Following Commands:  Follows one step commands with increased time Safety/Judgement: Decreased awareness of safety;Decreased awareness of deficits Awareness: Emergent   General Comments: Irritable which sons report is baseline. Eager to get home. Poor awareness of deficits. Running into environment on right side.       General Comments General comments (skin integrity, edema, etc.): Sons present during session.    Exercises     Assessment/Plan    PT Assessment Patient needs continued PT services  PT Problem List Decreased safety awareness;Decreased balance;Decreased cognition       PT Treatment Interventions Balance training;Patient/family education;Gait training;Stair training;Therapeutic activities;Therapeutic exercise;Cognitive remediation;Neuromuscular re-education    PT Goals (Current goals can be found in the Care Plan section)  Acute Rehab PT Goals Patient Stated Goal: Go home now PT Goal Formulation: With patient Time For Goal Achievement: 11/24/17 Potential to Achieve Goals: Good    Frequency Min 4X/week   Barriers to discharge Decreased caregiver support      Co-evaluation               AM-PAC PT "6 Clicks" Daily Activity  Outcome Measure Difficulty turning over in bed (including adjusting bedclothes, sheets and blankets)?: None Difficulty moving from lying on back to sitting on the side of the bed? : None Difficulty sitting down on and standing up from a chair with arms (e.g., wheelchair, bedside commode, etc,.)?: None Help needed moving to and from a bed to chair (including a wheelchair)?: None Help needed walking in hospital room?: A Little Help needed climbing 3-5 steps with a railing? : A Little 6 Click Score: 22    End of Session Equipment Utilized During Treatment: Gait belt Activity Tolerance: Patient tolerated treatment well Patient left: in bed;with bed alarm set;with call bell/phone within reach;with family/visitor present Nurse Communication: Mobility  status PT Visit Diagnosis: Unsteadiness on feet (R26.81);Difficulty in walking, not elsewhere classified (R26.2)    Time: 8675-4492 PT Time Calculation (min) (ACUTE ONLY): 20 min   Charges:   PT Evaluation $PT Eval Low Complexity: 1 Low     PT G CodesWray Kearns, PT, DPT [phone]    Marguarite Arbour A Zaydee Aina 11/10/2017, 1:28 PM

## 2017-11-10 NOTE — Evaluation (Signed)
Occupational Therapy Evaluation Patient Details Name: Brett Vasquez MRN: [id number] DOB: 04-20-1946 Today's Date: 11/10/2017    History of Present Illness 72 y.o. male with Past medical history of HTN. Patient presented with a fall. Pt lives with his brother and was found by brother and was not speaking and confused. MRI showing No acute intracranial abnormality.   Clinical Impression   PTA, pt was living with his brother and was independent with ADLs, IADLs, and driving. Pt currently requiring Min A for ADLs and functional mobility due to decreased balance and safety awareness. Noted decreased attention to left visual field. However, pt without glasses and perseverating on taking a nap and will need further assessment of vision. Pt presenting with decreased cognition as seen by requiring increased time and cues, poor attention, and decreased problem solving. Pt would benefit from further acute OT to increase safety and independence with ADLs. Recommend dc to home with follow up at Neuro OP OT to optimize safety and independence with ADLs, IADLs, and functional mobility.     Follow Up Recommendations  Outpatient OT;Supervision - Intermittent(Neuro OP OT)    Equipment Recommendations  None recommended by OT    Recommendations for Other Services PT consult     Precautions / Restrictions Precautions Precautions: Fall Restrictions Weight Bearing Restrictions: No      Mobility Bed Mobility Overal bed mobility: Needs Assistance Bed Mobility: Supine to Sit;Sit to Supine     Supine to sit: Supervision Sit to supine: Supervision   General bed mobility comments: supervision for safety  Transfers Overall transfer level: Needs assistance Equipment used: None Transfers: Sit to/from Stand Sit to Stand: Min assist         General transfer comment: Min A for balance. impuslvelystanding quicklyand required assistance for steadying    Balance Overall balance assessment: Needs  assistance Sitting-balance support: No upper extremity supported;Feet supported Sitting balance-Leahy Scale: Fair     Standing balance support: No upper extremity supported;During functional activity Standing balance-Leahy Scale: Fair Standing balance comment: decreased dyanmic balance.                           ADL either performed or assessed with clinical judgement   ADL Overall ADL's : Needs assistance/impaired Eating/Feeding: Set up;Supervision/ safety;Sitting   Grooming: Oral care;Min guard;Standing   Upper Body Bathing: Set up;Supervision/ safety;Sitting   Lower Body Bathing: Min guard;Sit to/from stand   Upper Body Dressing : Min guard;Standing Upper Body Dressing Details (indicate cue type and reason): Min Guard for donning gown like jacket with Min Guard for safety Lower Body Dressing: Minimal assistance;Sit to/from stand Lower Body Dressing Details (indicate cue type and reason): Min A for dyanmic balance in standing Toilet Transfer: Minimal assistance;Ambulation       Tub/ Shower Transfer: Tub transfer;Minimal assistance;Ambulation Tub/Shower Transfer Details (indicate cue type and reason): Min A for balance Functional mobility during ADLs: Minimal assistance General ADL Comments: Pt perseverating on taking a nap. Pt with decreased balance and impuslve during movement.      Vision Baseline Vision/History: Wears glasses Wears Glasses: At all times Patient Visual Report: No change from baseline Additional Comments: Pt with decreased attention to left visual field. WIll further assess      Perception     Praxis      Pertinent Vitals/Pain Pain Assessment: No/denies pain     Hand Dominance Left   Extremity/Trunk Assessment Upper Extremity Assessment Upper Extremity Assessment: Generalized weakness   Lower  Extremity Assessment Lower Extremity Assessment: Defer to PT evaluation   Cervical / Trunk Assessment Cervical / Trunk Assessment:  Normal   Communication Communication Communication: No difficulties   Cognition Arousal/Alertness: Awake/alert Behavior During Therapy: WFL for tasks assessed/performed;Impulsive Overall Cognitive Status: Impaired/Different from baseline Area of Impairment: Attention;Following commands;Safety/judgement;Awareness                   Current Attention Level: Selective   Following Commands: Follows one step commands with increased time Safety/Judgement: Decreased awareness of safety;Decreased awareness of deficits Awareness: Emergent   General Comments: Pt requiring increased time and cues throughout session. Very focused on taking a nap and resting. Does not recall event but able to recall what people have informed him of the fall at home.    General Comments  Son present during session    Exercises     Shoulder Instructions      Home Living Family/patient expects to be discharged to:: Private residence Living Arrangements: Other relatives(Brother) Available Help at Discharge: Family;Available PRN/intermittently Type of Home: House Home Access: Stairs to enter [employer] of Steps: 2-3   Home Layout: One level     Bathroom Shower/Tub: Tub/shower unit;Walk-in shower   Bathroom Toilet: Standard     Home Equipment: None          Prior Functioning/Environment Level of Independence: Independent                 OT Problem List: Decreased strength;Decreased range of motion;Decreased activity tolerance;Impaired balance (sitting and/or standing);Decreased cognition;Decreased safety awareness      OT Treatment/Interventions: Self-care/ADL training;Therapeutic exercise;Energy conservation;DME and/or AE instruction;Therapeutic activities;Patient/family education    OT Goals(Current goals can be found in the care plan section) Acute Rehab OT Goals Patient Stated Goal: Go home today OT Goal Formulation: With patient Time For Goal Achievement:  11/24/17 Potential to Achieve Goals: Good ADL Goals Pt Will Perform Grooming: with modified independence;standing Pt Will Perform Upper Body Dressing: with modified independence;standing Pt Will Perform Lower Body Dressing: with modified independence;sit to/from stand Pt Will Transfer to Toilet: with modified independence;ambulating;regular height toilet Additional ADL Goal #1: Pt will perform four step trail making task with 1-2 verbal cues  OT Frequency: Min 2X/week   Barriers to D/C:            Co-evaluation              AM-PAC PT "6 Clicks" Daily Activity     Outcome Measure Help from another person eating meals?: None Help from another person taking care of personal grooming?: A Little Help from another person toileting, which includes using toliet, bedpan, or urinal?: A Little Help from another person bathing (including washing, rinsing, drying)?: A Little Help from another person to put on and taking off regular upper body clothing?: A Little Help from another person to put on and taking off regular lower body clothing?: A Little 6 Click Score: 19   End of Session Equipment Utilized During Treatment: Gait belt Nurse Communication: Mobility status  Activity Tolerance: Patient limited by fatigue Patient left: in bed;with call bell/phone within reach;with bed alarm set;with family/visitor present  OT Visit Diagnosis: Unsteadiness on feet (R26.81);Other abnormalities of gait and mobility (R26.89);Muscle weakness (generalized) (M62.81);Other symptoms and signs involving cognitive function                Time: 1610-9604 OT Time Calculation (min): 19 min Charges:  OT General Charges $OT Visit: 1 Visit OT Evaluation $OT Eval Moderate Complexity: 1  Mod G-Codes:     Transport planner MSOT, OTR/L Acute Rehab Pager: [phone] Office: [place] 11/10/2017, 12:12 PM

## 2017-11-10 NOTE — Progress Notes (Signed)
Pt transferred  From [facility] with the diagnosis of stroke, pt alert ,denies any pain, accompanied by family, pt settled in bed with call light at bedside, tele monitor put and verified on pt, was however reassured and will continue to monitor, v/s stable. [place], Mercer Peifer Efe

## 2017-11-10 NOTE — Progress Notes (Signed)
Pt refusing to wear tele box and threatening to leave AMA.  Called CCMD and MD aware.

## 2017-11-10 NOTE — Care Management Obs Status (Signed)
[place] NOTIFICATION   Patient Details  Name: Brett Vasquez MRN: [id number] Date of Birth: 12-15-1945   Medicare Observation Status Notification Given:  Yes    Carles Collet, RN 11/10/2017, 11:08 AM

## 2017-11-10 NOTE — Procedures (Signed)
ELECTROENCEPHALOGRAM REPORT  Date of Study: 11/10/2017  Patient's Name: Brett Vasquez MRN: [id number] Date of Birth: 1946/02/25  Referring Provider: Lavina Hamman, MD  Clinical History: 72 y.o. male the past medical history of COPD, tobacco abuse hypertension presents to [facility] ER as a stroke alert for confusion/slurred speech and possible facial droop.  Medications: Tylenol ASA  Technical Summary: A multichannel digital EEG recording measured by the international 10-20 system with electrodes applied with paste and impedances below 5000 ohms performed in our laboratory with EKG monitoring in an awake and asleep patient.  Hyperventilation was not performed.  Photic stimulation was performed.  The digital EEG was referentially recorded, reformatted, and digitally filtered in a variety of bipolar and referential montages for optimal display.    Description: The patient is mostly asleep during the recording, as demonstrated by symmetric sleep spindles and theta slowing.  Photic stimulation did not produce any abnormalities.  During brief period of wakefulness, the posterior dominant rhythm is unable to be seen due to significant muscle artifact.  No electrographic seizures were seen.  EKG lead was unremarkable.  Impression: This EEG demonstrates normal sleep.  Due to compromise by muscle artifact, the EEG background during wakefulness could not be assessed.    Metta Clines, DO

## 2017-11-11 ENCOUNTER — Encounter: Payer: Self-pay | Admitting: Family Medicine

## 2017-11-11 ENCOUNTER — Encounter (HOSPITAL_COMMUNITY): Payer: Medicare Other

## 2017-11-11 LAB — HEMOGLOBIN A1C
Hgb A1c MFr Bld: 5.5 % (ref 4.8–5.6)
Mean Plasma Glucose: 111 mg/dL

## 2017-11-11 NOTE — Progress Notes (Signed)
Pt very agitated and verbally aggressive.  Pt removed own iv site, will not follow unit rules with bed alarm.  Redirected and educated pt several times with no success.  Will continue to monitor

## 2017-11-11 NOTE — Progress Notes (Signed)
PT STATED FOLLOWING REPORT THAT HE WAS LEAVING THIS AM AND THAT FAMILY WAS EN ROUTE TO TRANSPORT. ALERT ORIENTED, HOWEVER, IS UNRECEPTIVE TO EDUCATION RE CARE PLAN INPATIENT AND GOALS OF CARE. NOT WILLING TO WAIT FOR PROVIDER DISCHARGE. SON ARRIVED AND STATED THAT HIS FATHER APPEARS TO BE AT "HIS BASELINE", WITH REGARD TO AMBULATION, FACIAL EXPRESSION, REASONING ABILITY AND AFFECT. BOTH EXITED 3W BY ELEVATOR. NO OBVIOUS DISTRESS WAS NOTED FOR PATIENT. MD NOTIFIED.

## 2017-11-11 NOTE — Discharge Summary (Signed)
Patient left AMA before I could examine patient.   Per yesterday's neurology notes:  72 year old male with history of COPD, HTN, smoker admitted for confusion, found down on the floor, urinary incontinence and combative with EMS staff.  Received 5 mg Versed for agitation and combative behavior.  CT negative for acute abnormality.  MRI with and without contrast unremarkable, MRA head no stenosis or occlusion.  EEG normal, no seizure-like activity.  2D echo showed EF 45-50%.  Carotid Doppler pending.  LDL 92 and A1c pending.  On examination today, patient awake alert, restless and asking to be discharged home or go outside smoking.  Fully orientated, able to backward spelling world and good at calculation.  No apraxia, following three-step commands.  Moving all extremities, sensation symmetrical, coordination intact.  He is episode concerning for seizure due to confusion, urinary incontinence and combative postictal pattern, although TIA still in differential.  Recommend to continue aspirin 325 and Lipitor 20 on discharge.  No AED needed at this time.  If episode recurrent, may consider EGD at that time.  However, patient educated on no driving until seizure-free for 6 months according to [facility].  Seizure precautions at home.   Once again patient left signed AMA paperwork and left with son.  [place]

## 2017-11-25 ENCOUNTER — Ambulatory Visit: Payer: Medicare Other | Attending: Internal Medicine | Admitting: Physical Therapy

## 2017-11-25 ENCOUNTER — Ambulatory Visit: Payer: Medicare Other | Admitting: Occupational Therapy

## 2017-12-30 ENCOUNTER — Other Ambulatory Visit: Payer: Self-pay | Admitting: Family Medicine

## 2017-12-30 DIAGNOSIS — J439 Emphysema, unspecified: Secondary | ICD-10-CM

## 2018-04-19 ENCOUNTER — Ambulatory Visit (INDEPENDENT_AMBULATORY_CARE_PROVIDER_SITE_OTHER): Payer: Medicare Other | Admitting: Family Medicine

## 2018-04-19 ENCOUNTER — Encounter: Payer: Self-pay | Admitting: Family Medicine

## 2018-04-19 VITALS — BP 200/104 | HR 68 | Temp 98.2°F | Resp 16 | Ht 67.0 in | Wt 180.0 lb

## 2018-04-19 DIAGNOSIS — J439 Emphysema, unspecified: Secondary | ICD-10-CM

## 2018-04-19 DIAGNOSIS — I160 Hypertensive urgency: Secondary | ICD-10-CM | POA: Diagnosis not present

## 2018-04-19 DIAGNOSIS — I10 Essential (primary) hypertension: Secondary | ICD-10-CM

## 2018-04-19 LAB — CBC WITH DIFFERENTIAL/PLATELET
BASOS PCT: 1 %
Basophils Absolute: 74 cells/uL (ref 0–200)
EOS ABS: 200 {cells}/uL (ref 15–500)
Eosinophils Relative: 2.7 %
HCT: 44.7 % (ref 38.5–50.0)
HEMOGLOBIN: 14.9 g/dL (ref 13.2–17.1)
Lymphs Abs: 1606 cells/uL (ref 850–3900)
MCH: 30 pg (ref 27.0–33.0)
MCHC: 33.3 g/dL (ref 32.0–36.0)
MCV: 90.1 fL (ref 80.0–100.0)
MONOS PCT: 6.1 %
MPV: 9.5 fL (ref 7.5–12.5)
NEUTROS ABS: 5069 {cells}/uL (ref 1500–7800)
Neutrophils Relative %: 68.5 %
PLATELETS: 237 10*3/uL (ref 140–400)
RBC: 4.96 10*6/uL (ref 4.20–5.80)
RDW: 13.2 % (ref 11.0–15.0)
TOTAL LYMPHOCYTE: 21.7 %
WBC: 7.4 10*3/uL (ref 3.8–10.8)
WBCMIX: 451 {cells}/uL (ref 200–950)

## 2018-04-19 LAB — COMPLETE METABOLIC PANEL WITH GFR
AG Ratio: 1.9 (calc) (ref 1.0–2.5)
ALBUMIN MSPROF: 4.2 g/dL (ref 3.6–5.1)
ALT: 7 U/L — AB (ref 9–46)
AST: 14 U/L (ref 10–35)
Alkaline phosphatase (APISO): 92 U/L (ref 40–115)
BUN: 13 mg/dL (ref 7–25)
CALCIUM: 8.9 mg/dL (ref 8.6–10.3)
CO2: 28 mmol/L (ref 20–32)
Chloride: 107 mmol/L (ref 98–110)
Creat: 1.03 mg/dL (ref 0.70–1.18)
GFR, EST AFRICAN AMERICAN: 83 mL/min/{1.73_m2} (ref >=60)
GFR, EST NON AFRICAN AMERICAN: 71 mL/min/{1.73_m2} (ref >=60)
GLUCOSE: 92 mg/dL (ref 65–99)
Globulin: 2.2 g/dL (calc) (ref 1.9–3.7)
Potassium: 3.5 mmol/L (ref 3.5–5.3)
Sodium: 142 mmol/L (ref 135–146)
TOTAL PROTEIN: 6.4 g/dL (ref 6.1–8.1)
Total Bilirubin: 0.7 mg/dL (ref 0.2–1.2)

## 2018-04-19 LAB — LIPID PANEL
Cholesterol: 173 mg/dL (ref <200)
HDL: 69 mg/dL (ref >40)
LDL CHOLESTEROL (CALC): 90 mg/dL
NON-HDL CHOLESTEROL (CALC): 104 mg/dL (ref <130)
TRIGLYCERIDES: 58 mg/dL (ref <150)
Total CHOL/HDL Ratio: 2.5 (calc) (ref <5.0)

## 2018-04-19 MED ORDER — BUDESONIDE-FORMOTEROL FUMARATE 160-4.5 MCG/ACT IN AERO: INHALATION_SPRAY | RESPIRATORY_TRACT | 5 refills | Status: DC

## 2018-04-19 MED ORDER — HYDROCHLOROTHIAZIDE 25 MG PO TABS: 25.0000 mg | ORAL_TABLET | Freq: Every day | ORAL | 1 refills | Status: DC

## 2018-04-19 MED ORDER — AMLODIPINE BESYLATE 10 MG PO TABS: 10.0000 mg | ORAL_TABLET | Freq: Every day | ORAL | 1 refills | Status: DC

## 2018-04-19 NOTE — Progress Notes (Signed)
Subjective:    Patient ID: Brett Vasquez, male    DOB: 1946-07-04, 72 y.o.   MRN: [id number]  Medication Refill     12/16 Patient is here today for complete physical exam. He is overdue for a flu shot, pneumonia vaccine, shingles vaccine. He refuses the flu shot and the shingles vaccine but he is willing to consent to Pneumovax 23. He is overdue for a colonoscopy which he refuses. However we discussed this at great length today and he states that he will consider further. He declines a digital rectal exam however his PSA was reassuring. His blood pressure today is elevated at 152/98. However he denies any other complaints.  At that time, my plan was: Patient's physical exam today is completely normal. He refuses a flu shot. However he allow me to give him Pneumovax 23. He is not interested in the shingles vaccine. He refuses a digital rectal exam. He will consider colonoscopy however he does not want me to schedule it at the present time. I have reviewed his lab work which is listed below and all of which are excellent.   I have asked the patient to return in one month for recheck of his blood pressure after the addition of losartan 100 mg by mouth daily. I will again recommend a flu shot at that time and hopefully I can convince him then to receive it  Also recommended smoking cessation but the patient is in the pre-contemplative phase.  05/05/16 Patient has not been seen since that time. He called complaining of dizziness. He stated losartan was making him dizzy even though he been on the medication for 8 months. Therefore I asked the patient come in. He is actually stop losartan more than a week ago. His blood pressure today is extremely high. He denies any chest pain shortness of breath or dyspnea on exertion. He does admit to excessive salt use. He jokingly states that he puts salt on country ham. He also eats a lot of pork. He is compliant taking his hydrochlorothiazide but it is obviously  insufficient to manage his blood pressure. He is no longer on losartan.  At that time, my plan was: Continue hydrochlorothiazide. Supplement with amlodipine 10 mg by mouth daily. Recheck blood pressure in one month. Monitor renal function and baseline lab work today   06/05/16 Blood pressure is much better. He denies any side effects from amlodipine or the hydrochlorothiazide. He denies any chest pain shortness of breath or dyspnea on exertion.  At that time, my plan was: Blood pressure is now at goal. Continue amlodipine and hydrochlorothiazide. Return for physical exam in January  04/19/18 Patient is here today for follow-up area we would not refill his Symbicort without an appointment.  However upon arrival, his blood pressure is extremely high at 200/104.  There is no evidence of endorgan damage.  Patient denies any headache.  He denies any confusion or memory loss.  He denies any blurry vision or vision changes.  He denies any chest pain shortness of breath or dyspnea on exertion.  He denies any abdominal pain.  He denies any oliguria or hematuria.  He denies any transient loss of vision.  Overall he states he feels fine.  He did not even realize his blood pressure was high until we told him that his blood pressure was up.  However he stopped all of his medications more than a year ago. Past Medical History:  Diagnosis Date  . COPD (chronic obstructive pulmonary disease) (  [place])   . Hypertension   . Smoker unmotivated to quit    No past surgical history on file. Current Outpatient Medications on File Prior to Visit  Medication Sig Dispense Refill  . hydrochlorothiazide (HYDRODIURIL) 25 MG tablet TAKE 1 TABLET BY MOUTH EVERY DAY (Patient not taking: Reported on 04/19/2018) 90 tablet 1   No current facility-administered medications on file prior to visit.    No Known Allergies Social History   Socioeconomic History  . Marital status: Married    Spouse name: Not on file  . Number of children:  Not on file  . Years of education: Not on file  . Highest education level: Not on file  Occupational History  . Not on file  Social Needs  . Financial resource strain: Not on file  . Food insecurity:    Worry: Not on file    Inability: Not on file  . Transportation needs:    Medical: Not on file    Non-medical: Not on file  Tobacco Use  . Smoking status: Current Every Day Smoker    Packs/day: 1.00    Years: 60.00    Pack years: 60.00    Types: Cigarettes  . Smokeless tobacco: Never Used  Substance and Sexual Activity  . Alcohol use: No    Frequency: Never  . Drug use: No  . Sexual activity: Not on file    Comment: divorced, 3 grown children  Lifestyle  . Physical activity:    Days per week: Not on file    Minutes per session: Not on file  . Stress: Not on file  Relationships  . Social connections:    Talks on phone: Not on file    Gets together: Not on file    Attends religious service: Not on file    Active member of club or organization: Not on file    Attends meetings of clubs or organizations: Not on file    Relationship status: Not on file  . Intimate partner violence:    Fear of current or ex partner: Not on file    Emotionally abused: Not on file    Physically abused: Not on file    Forced sexual activity: Not on file  Other Topics Concern  . Not on file  Social History Narrative   ** Merged History Encounter **       Family History  Family history unknown: Yes      Review of Systems  All other systems reviewed and are negative.      Objective:   Physical Exam  Constitutional: He is oriented to person, place, and time. He appears well-developed and well-nourished. No distress.  HENT:  Head: Normocephalic and atraumatic.  Right Ear: External ear normal.  Left Ear: External ear normal.  Nose: Nose normal.  Mouth/Throat: Oropharynx is clear and moist. No oropharyngeal exudate.  Eyes: Pupils are equal, round, and reactive to light. Conjunctivae  and EOM are normal. Right eye exhibits no discharge. Left eye exhibits no discharge. No scleral icterus.  Neck: Normal range of motion. Neck supple. No JVD present. No tracheal deviation present. No thyromegaly present.  Cardiovascular: Normal rate, regular rhythm, normal heart sounds and intact distal pulses. Exam reveals no gallop.  No murmur heard. Pulmonary/Chest: Effort normal. No stridor. No respiratory distress. He has decreased breath sounds. He has no wheezes. He has no rales.  Abdominal: Soft. Bowel sounds are normal. He exhibits no distension and no mass. There is no tenderness. There is  no rebound and no guarding.  Musculoskeletal: Normal range of motion. He exhibits no edema or tenderness.  Lymphadenopathy:    He has no cervical adenopathy.  Neurological: He is alert and oriented to person, place, and time. He has normal reflexes. No cranial nerve deficit. He exhibits normal muscle tone. Coordination normal.  Skin: Skin is warm. No rash noted. He is not diaphoretic. No erythema. No pallor.  Psychiatric: He has a normal mood and affect. His behavior is normal. Judgment and thought content normal.  Vitals reviewed.         Assessment & Plan:  Benign essential HTN  Pulmonary emphysema, unspecified emphysema type ([place]) - Plan: budesonide-formoterol (SYMBICORT) 160-4.5 MCG/ACT inhaler  Hypertensive urgency - Plan: CBC with Differential/Platelet, COMPLETE METABOLIC PANEL WITH GFR, Lipid panel  Patient can certainly resume Symbicort which is the only reason he made the appointment.  He does have diminished breath sounds bilaterally.  However I am very concerned by his blood pressure.  There is no evidence of endorgan damage and therefore I believe that he has hypertensive urgency.  Begin amlodipine 10 mg a day and hydrochlorothiazide 25 mg a day and recheck blood pressure at office visit in 1 week.  Seek medical attention immediately if he develops any symptoms of endorgan damage.   Also check CBC, CMP, and fasting lipid panel.

## 2018-04-20 ENCOUNTER — Encounter: Payer: Self-pay | Admitting: Family Medicine

## 2018-04-26 ENCOUNTER — Encounter: Payer: Self-pay | Admitting: Family Medicine

## 2018-04-26 ENCOUNTER — Ambulatory Visit (INDEPENDENT_AMBULATORY_CARE_PROVIDER_SITE_OTHER): Payer: Medicare Other | Admitting: Family Medicine

## 2018-04-26 VITALS — BP 128/74 | HR 72 | Temp 98.0°F | Resp 16 | Ht 67.0 in | Wt 175.0 lb

## 2018-04-26 DIAGNOSIS — Z1211 Encounter for screening for malignant neoplasm of colon: Secondary | ICD-10-CM | POA: Diagnosis not present

## 2018-04-26 DIAGNOSIS — I10 Essential (primary) hypertension: Secondary | ICD-10-CM | POA: Diagnosis not present

## 2018-04-26 DIAGNOSIS — Z1159 Encounter for screening for other viral diseases: Secondary | ICD-10-CM

## 2018-04-26 NOTE — Progress Notes (Signed)
Subjective:    Patient ID: [name], male    DOB: Jul 18, 1946, 72 y.o.   MRN: [id number]  Medication Refill     12/16 Patient is here today for complete physical exam. He is overdue for a flu shot, pneumonia vaccine, shingles vaccine. He refuses the flu shot and the shingles vaccine but he is willing to consent to Pneumovax 23. He is overdue for a colonoscopy which he refuses. However we discussed this at great length today and he states that he will consider further. He declines a digital rectal exam however his PSA was reassuring. His blood pressure today is elevated at 152/98. However he denies any other complaints.  At that time, my plan was: Patient's physical exam today is completely normal. He refuses a flu shot. However he allow me to give him Pneumovax 23. He is not interested in the shingles vaccine. He refuses a digital rectal exam. He will consider colonoscopy however he does not want me to schedule it at the present time. I have reviewed his lab work which is listed below and all of which are excellent.   I have asked the patient to return in one month for recheck of his blood pressure after the addition of losartan 100 mg by mouth daily. I will again recommend a flu shot at that time and hopefully I can convince him then to receive it  Also recommended smoking cessation but the patient is in the pre-contemplative phase.  05/05/16 Patient has not been seen since that time. He called complaining of dizziness. He stated losartan was making him dizzy even though he been on the medication for 8 months. Therefore I asked the patient come in. He is actually stop losartan more than a week ago. His blood pressure today is extremely high. He denies any chest pain shortness of breath or dyspnea on exertion. He does admit to excessive salt use. He jokingly states that he puts salt on country ham. He also eats a lot of pork. He is compliant taking his hydrochlorothiazide but it is obviously  insufficient to manage his blood pressure. He is no longer on losartan.  At that time, my plan was: Continue hydrochlorothiazide. Supplement with amlodipine 10 mg by mouth daily. Recheck blood pressure in one month. Monitor renal function and baseline lab work today   06/05/16 Blood pressure is much better. He denies any side effects from amlodipine or the hydrochlorothiazide. He denies any chest pain shortness of breath or dyspnea on exertion.  At that time, my plan was: Blood pressure is now at goal. Continue amlodipine and hydrochlorothiazide. Return for physical exam in January  04/19/18 Patient is here today for follow-up area we would not refill his Symbicort without an appointment.  However upon arrival, his blood pressure is extremely high at 200/104.  There is no evidence of endorgan damage.  Patient denies any headache.  He denies any confusion or memory loss.  He denies any blurry vision or vision changes.  He denies any chest pain shortness of breath or dyspnea on exertion.  He denies any abdominal pain.  He denies any oliguria or hematuria.  He denies any transient loss of vision.  Overall he states he feels fine.  He did not even realize his blood pressure was high until we told him that his blood pressure was up.  However he stopped all of his medications more than a year ago.  At that time, my plan was: Patient can certainly resume Symbicort which  is the only reason he made the appointment.  He does have diminished breath sounds bilaterally.  However I am very concerned by his blood pressure.  There is no evidence of endorgan damage and therefore I believe that he has hypertensive urgency.  Begin amlodipine 10 mg a day and hydrochlorothiazide 25 mg a day and recheck blood pressure at office visit in 1 week.  Seek medical attention immediately if he develops any symptoms of endorgan damage.  Also check CBC, CMP, and fasting lipid panel.  04/26/18 Blood pressure is much better today.  I  repeated his blood pressure personally and found exactly the same number.  He feels fine on hydrochlorothiazide and amlodipine.  He denies any chest pain.  He denies any shortness of breath.  He denies any syncope or lightheadedness.  His lab work returned normal as shown below: No visits with results within 1 Week(s) from this visit.  Latest known visit with results is:  Office Visit on 04/19/2018  Component Date Value Ref Range Status  . WBC 04/19/2018 7.4  3.8 - 10.8 Thousand/uL Final  . RBC 04/19/2018 4.96  4.20 - 5.80 Million/uL Final  . Hemoglobin 04/19/2018 14.9  13.2 - 17.1 g/dL Final  . HCT 04/19/2018 44.7  38.5 - 50.0 % Final  . MCV 04/19/2018 90.1  80.0 - 100.0 fL Final  . MCH 04/19/2018 30.0  27.0 - 33.0 pg Final  . MCHC 04/19/2018 33.3  32.0 - 36.0 g/dL Final  . RDW 04/19/2018 13.2  11.0 - 15.0 % Final  . Platelets 04/19/2018 237  140 - 400 Thousand/uL Final  . MPV 04/19/2018 9.5  7.5 - 12.5 fL Final  . Neutro Abs 04/19/2018 5,069  1,500 - 7,800 cells/uL Final  . Lymphs Abs 04/19/2018 1,606  850 - 3,900 cells/uL Final  . WBC mixed population 04/19/2018 451  200 - 950 cells/uL Final  . Eosinophils Absolute 04/19/2018 200  15 - 500 cells/uL Final  . Basophils Absolute 04/19/2018 74  0 - 200 cells/uL Final  . Neutrophils Relative % 04/19/2018 68.5  % Final  . Total Lymphocyte 04/19/2018 21.7  % Final  . Monocytes Relative 04/19/2018 6.1  % Final  . Eosinophils Relative 04/19/2018 2.7  % Final  . Basophils Relative 04/19/2018 1.0  % Final  . Glucose, Bld 04/19/2018 92  65 - 99 mg/dL Final   Comment: .            Fasting reference interval .   . BUN 04/19/2018 13  7 - 25 mg/dL Final  . Creat 04/19/2018 1.03  0.70 - 1.18 mg/dL Final   Comment: For patients >41 years of age, the reference limit for Creatinine is approximately 13% higher for people identified as African-American. .   . GFR, Est Non African American 04/19/2018 71  > OR = 60 mL/min/1.57m2 Final  . GFR, Est  African American 04/19/2018 83  > OR = 60 mL/min/1.49m2 Final  . BUN/Creatinine Ratio 63/84/5364 NOT APPLICABLE  6 - 22 (calc) Final  . Sodium 04/19/2018 142  135 - 146 mmol/L Final  . Potassium 04/19/2018 3.5  3.5 - 5.3 mmol/L Final  . Chloride 04/19/2018 107  98 - 110 mmol/L Final  . CO2 04/19/2018 28  20 - 32 mmol/L Final  . Calcium 04/19/2018 8.9  8.6 - 10.3 mg/dL Final  . Total Protein 04/19/2018 6.4  6.1 - 8.1 g/dL Final  . Albumin 04/19/2018 4.2  3.6 - 5.1 g/dL Final  . Globulin 04/19/2018 2.2  1.9 - 3.7 g/dL (calc) Final  . AG Ratio 04/19/2018 1.9  1.0 - 2.5 (calc) Final  . Total Bilirubin 04/19/2018 0.7  0.2 - 1.2 mg/dL Final  . Alkaline phosphatase (APISO) 04/19/2018 92  40 - 115 U/L Final  . AST 04/19/2018 14  10 - 35 U/L Final  . ALT 04/19/2018 7* 9 - 46 U/L Final  . Cholesterol 04/19/2018 173  <200 mg/dL Final  . HDL 04/19/2018 69  >40 mg/dL Final  . Triglycerides 04/19/2018 58  <150 mg/dL Final  . LDL Cholesterol (Calc) 04/19/2018 90  mg/dL (calc) Final   Comment: Reference range: <100 . Desirable range <100 mg/dL for primary prevention;   <70 mg/dL for patients with CHD or diabetic patients  with > or = 2 CHD risk factors. Marland Kitchen LDL-C is now calculated using the Martin-Hopkins  calculation, which is a validated novel method providing  better accuracy than the Friedewald equation in the  estimation of LDL-C.  Cresenciano Genre et al. Annamaria Helling. 0350;093(81): 2061-2068  (http://education.[facility].com/faq/FAQ164)   . Total CHOL/HDL Ratio 04/19/2018 2.5  <5.0 (calc) Final  . Non-HDL Cholesterol (Calc) 04/19/2018 104  <130 mg/dL (calc) Final   Comment: For patients with diabetes plus 1 major ASCVD risk  factor, treating to a non-HDL-C goal of <100 mg/dL  (LDL-C of <70 mg/dL) is considered a therapeutic  option.     Past Medical History:  Diagnosis Date  . COPD (chronic obstructive pulmonary disease) ([place])   . Hypertension   . Smoker unmotivated to quit    No past  surgical history on file. Current Outpatient Medications on File Prior to Visit  Medication Sig Dispense Refill  . amLODipine (NORVASC) 10 MG tablet Take 1 tablet (10 mg total) by mouth daily. 30 tablet 1  . budesonide-formoterol (SYMBICORT) 160-4.5 MCG/ACT inhaler INHALE 2 PUFFS INTO THE LUNGS 2 TIMES DALIY 30.6 Inhaler 5  . hydrochlorothiazide (HYDRODIURIL) 25 MG tablet Take 1 tablet (25 mg total) by mouth daily. 30 tablet 1   No current facility-administered medications on file prior to visit.    No Known Allergies Social History   Socioeconomic History  . Marital status: Married    Spouse name: Not on file  . Number of children: Not on file  . Years of education: Not on file  . Highest education level: Not on file  Occupational History  . Not on file  Social Needs  . Financial resource strain: Not on file  . Food insecurity:    Worry: Not on file    Inability: Not on file  . Transportation needs:    Medical: Not on file    Non-medical: Not on file  Tobacco Use  . Smoking status: Current Every Day Smoker    Packs/day: 1.00    Years: 60.00    Pack years: 60.00    Types: Cigarettes  . Smokeless tobacco: Never Used  Substance and Sexual Activity  . Alcohol use: No    Frequency: Never  . Drug use: No  . Sexual activity: Not on file    Comment: divorced, 3 grown children  Lifestyle  . Physical activity:    Days per week: Not on file    Minutes per session: Not on file  . Stress: Not on file  Relationships  . Social connections:    Talks on phone: Not on file    Gets together: Not on file    Attends religious service: Not on file    Active member of club or  organization: Not on file    Attends meetings of clubs or organizations: Not on file    Relationship status: Not on file  . Intimate partner violence:    Fear of current or ex partner: Not on file    Emotionally abused: Not on file    Physically abused: Not on file    Forced sexual activity: Not on file   Other Topics Concern  . Not on file  Social History Narrative   ** Merged History Encounter **       Family History  Family history unknown: Yes      Review of Systems  All other systems reviewed and are negative.      Objective:   Physical Exam  Constitutional: He is oriented to person, place, and time. He appears well-developed and well-nourished. No distress.  HENT:  Head: Normocephalic and atraumatic.  Right Ear: External ear normal.  Left Ear: External ear normal.  Nose: Nose normal.  Mouth/Throat: Oropharynx is clear and moist. No oropharyngeal exudate.  Eyes: Pupils are equal, round, and reactive to light. Conjunctivae and EOM are normal. Right eye exhibits no discharge. Left eye exhibits no discharge. No scleral icterus.  Neck: Normal range of motion. Neck supple. No JVD present. No tracheal deviation present. No thyromegaly present.  Cardiovascular: Normal rate, regular rhythm, normal heart sounds and intact distal pulses. Exam reveals no gallop.  No murmur heard. Pulmonary/Chest: Effort normal. No stridor. No respiratory distress. He has decreased breath sounds. He has no wheezes. He has no rales.  Abdominal: Soft. Bowel sounds are normal. He exhibits no distension and no mass. There is no tenderness. There is no rebound and no guarding.  Musculoskeletal: Normal range of motion. He exhibits no edema or tenderness.  Lymphadenopathy:    He has no cervical adenopathy.  Neurological: He is alert and oriented to person, place, and time. He has normal reflexes. No cranial nerve deficit. He exhibits normal muscle tone. Coordination normal.  Skin: Skin is warm. No rash noted. He is not diaphoretic. No erythema. No pallor.  Psychiatric: He has a normal mood and affect. His behavior is normal. Judgment and thought content normal.  Vitals reviewed.         Assessment & Plan:  Colon cancer screening - Plan: Ambulatory referral to Gastroenterology  Benign essential  HTN  Patient's labs are excellent.  His blood pressure is at goal.  I have requested the patient to get a colonoscopy to screen for colon cancer as he is overdue.  He agrees to allow me to schedule this.  He is due for Pneumovax 23.  He declines this today but states that he will come back for.  He is also due for hepatitis C screening.  He also declines this today but states that he will come back for hepatitis C screening test.  I will place the order in his chart

## 2018-08-11 ENCOUNTER — Other Ambulatory Visit: Payer: Self-pay | Admitting: Family Medicine

## 2018-08-11 DIAGNOSIS — I10 Essential (primary) hypertension: Secondary | ICD-10-CM

## 2018-08-14 ENCOUNTER — Other Ambulatory Visit: Payer: Self-pay | Admitting: Family Medicine

## 2018-10-28 ENCOUNTER — Ambulatory Visit: Payer: Self-pay | Admitting: Family Medicine

## 2018-11-16 ENCOUNTER — Other Ambulatory Visit: Payer: Self-pay | Admitting: *Deleted

## 2018-11-16 MED ORDER — AMLODIPINE BESYLATE 10 MG PO TABS: 10.0000 mg | ORAL_TABLET | Freq: Every day | ORAL | 1 refills | Status: DC

## 2018-11-17 ENCOUNTER — Encounter: Payer: Self-pay | Admitting: Family Medicine

## 2019-01-14 ENCOUNTER — Other Ambulatory Visit: Payer: Self-pay | Admitting: Family Medicine

## 2019-03-20 ENCOUNTER — Other Ambulatory Visit: Payer: Self-pay | Admitting: Family Medicine

## 2019-04-12 IMAGING — CT CT HEAD CODE STROKE
4 series · 16 of 47 positions shown, 18 images · non-contrast
Comparison: None.

CLINICAL DATA: Code stroke.  Left facial droop and slurred speech

EXAM:
CT HEAD WITHOUT CONTRAST
TECHNIQUE: Contiguous axial images were obtained from the base of the skull
through the vertex without intravenous contrast.

[Series 3: head wo · axial · 0.44mm/px · z∈[+1601,+1726]mm · 7 of 35 slices shown, 9 images]
[im 5/35  brain]
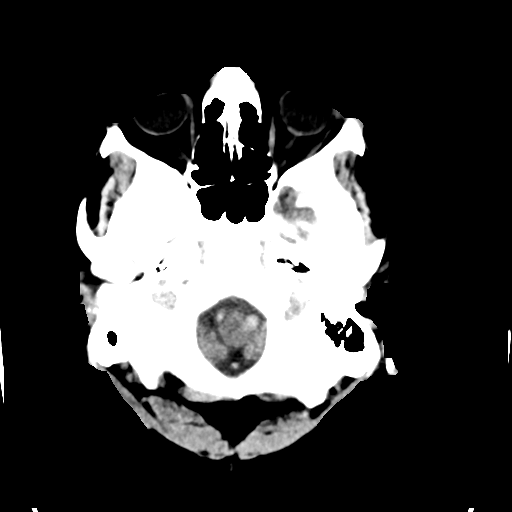
[im 5/35  bone]
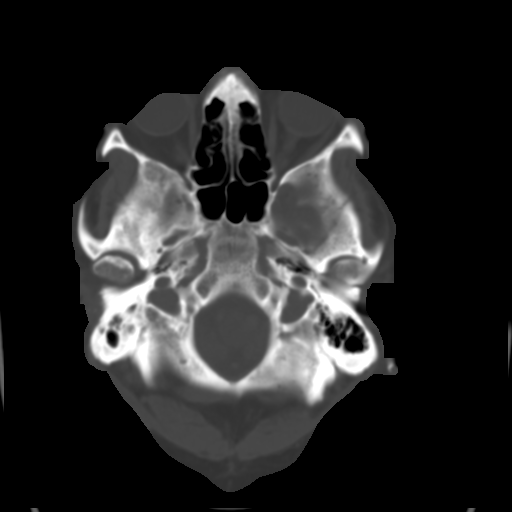
[im 9/35  brain]
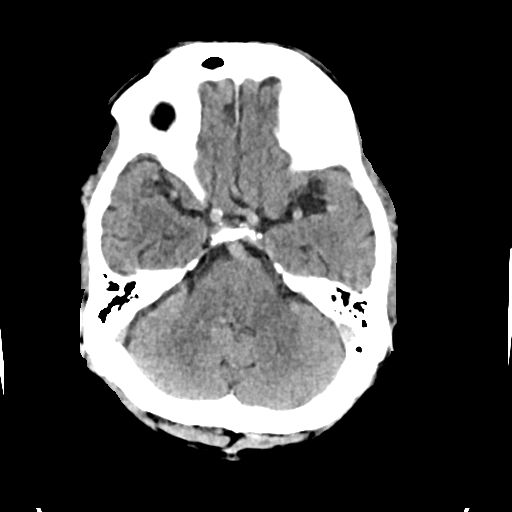
[im 13/35  brain]
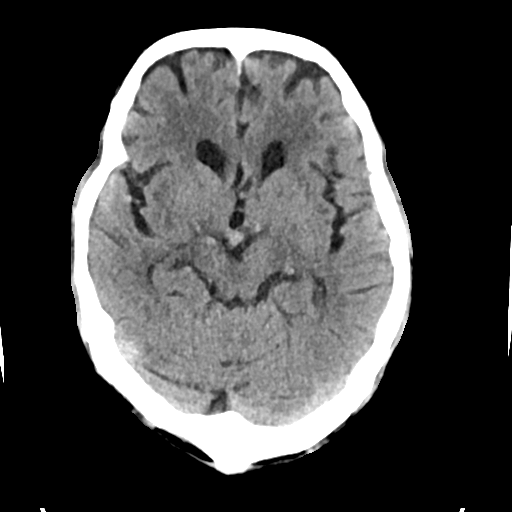
[im 18/35  brain]
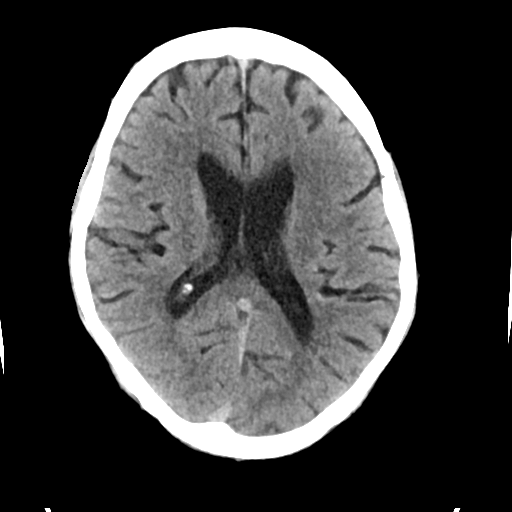
[im 22/35  brain]
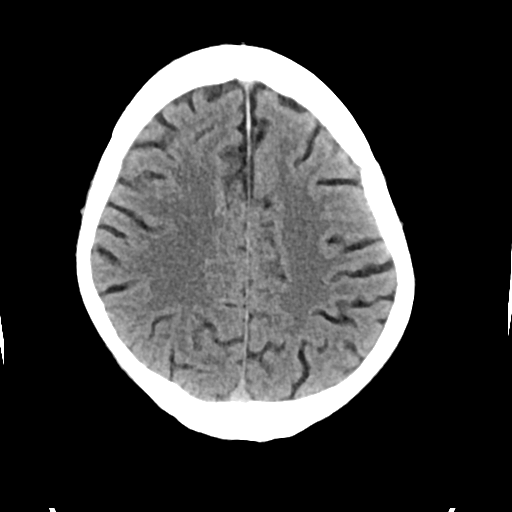
[im 22/35  bone]
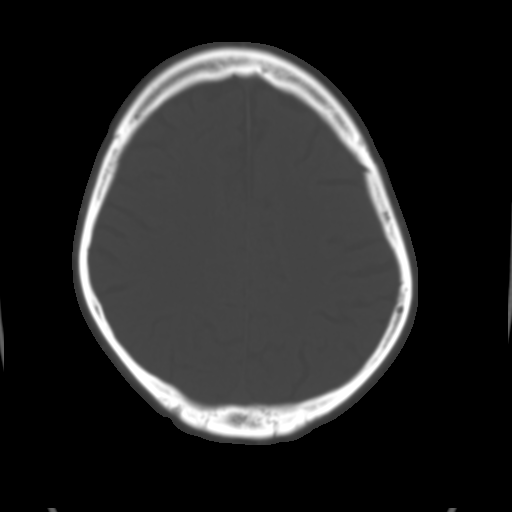
[im 26/35  brain]
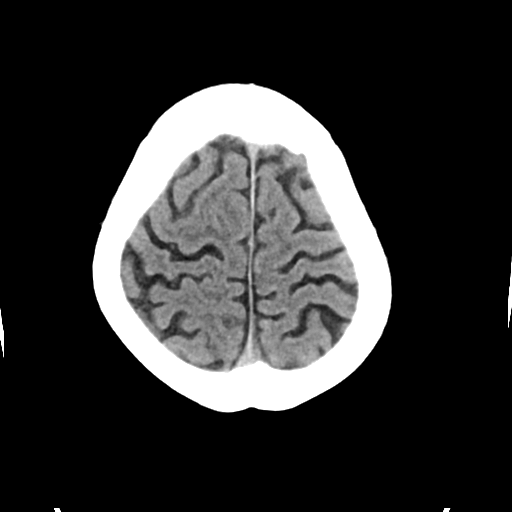
[im 30/35  brain]
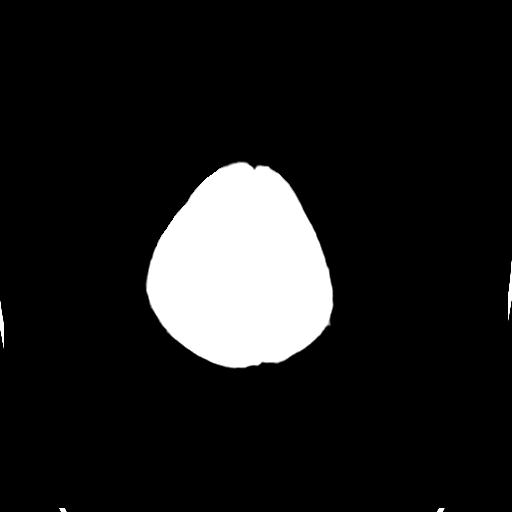

[Series 4: head bone · axial · 0.44mm/px · z∈[+1597,+1631]mm · 3 of 86 slices shown]
[im 9/86  bone]
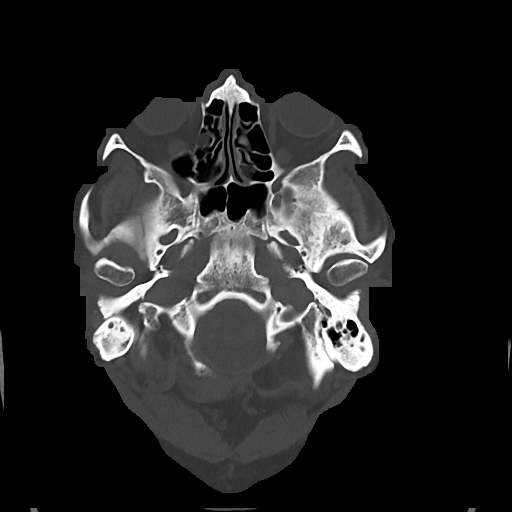
[im 18/86  bone]
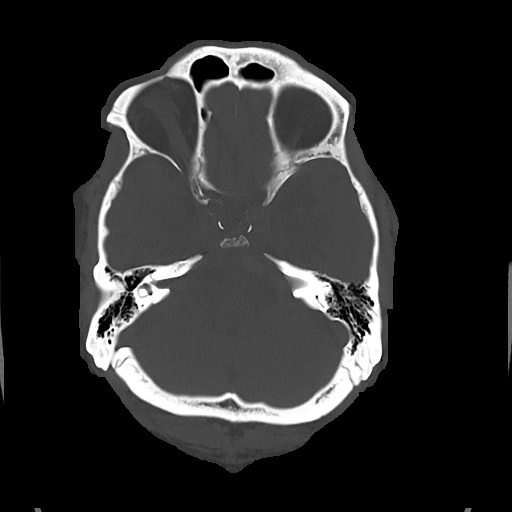
[im 26/86  bone]
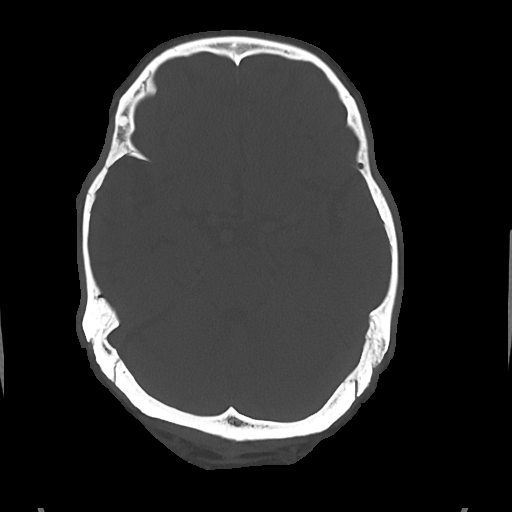

[Series 5: cor soft · coronal · 0.33mm/px · 3 of 72 slices shown]
[im 24/72  brain]
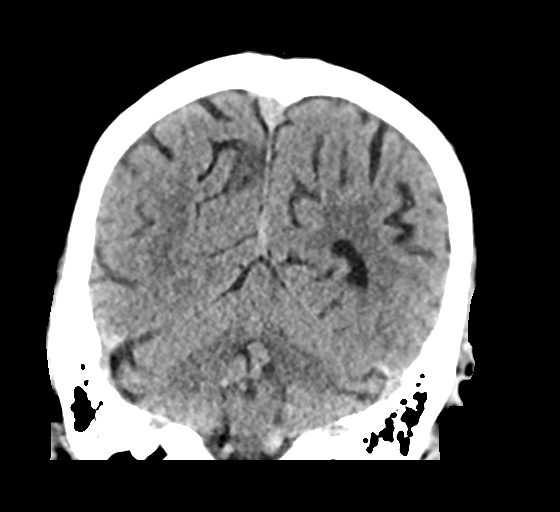
[im 32/72  brain]
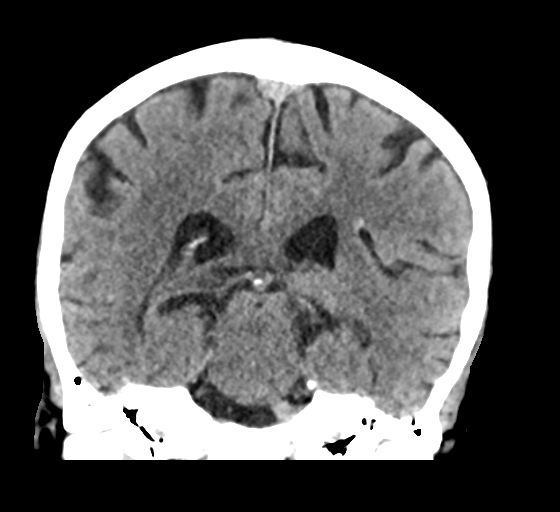
[im 40/72  brain]
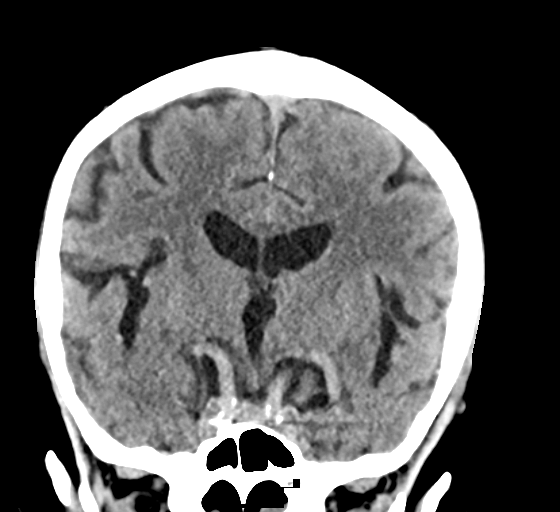

[Series 6: sag soft · sagittal · 0.33mm/px · 3 of 60 slices shown]
[im 20/60  brain]
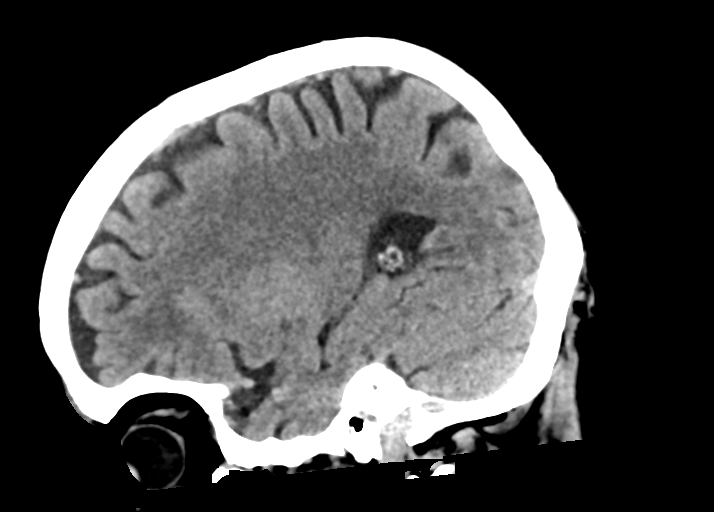
[im 30/60  brain]
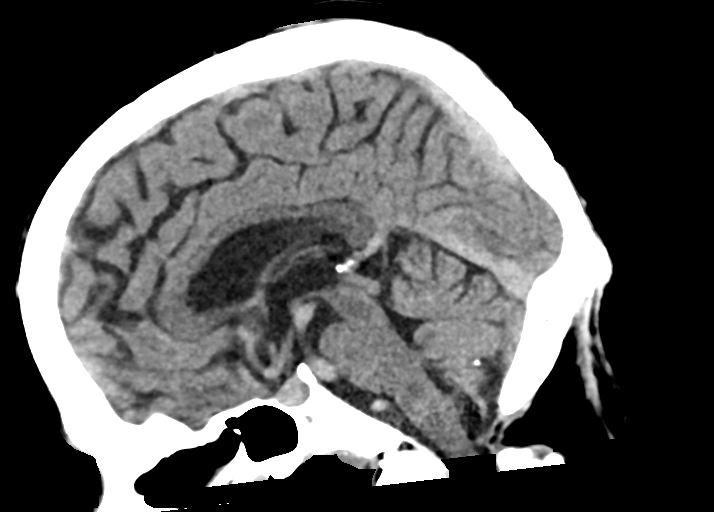
[im 40/60  brain]
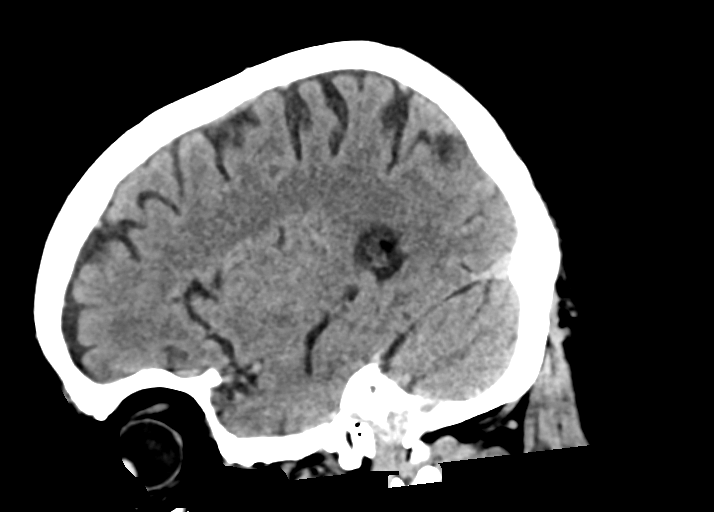

[16 of 47 positions shown; findings below may reference images not displayed]

FINDINGS: Brain: No mass lesion or acute hemorrhage. No focal hypoattenuation
of the basal ganglia or cortex to indicate infarcted tissue. No
hydrocephalus or age advanced atrophy.

Vascular: No hyperdense vessel. Atherosclerotic calcification of the
internal carotid arteries at the skull base.

Skull: Normal visualized skull base, calvarium and extracranial soft
tissues.

Sinuses/Orbits: No sinus fluid levels or advanced mucosal
thickening. No mastoid effusion. Normal orbits.

ASPECTS ([place] Stroke Program Early CT Score)

- Ganglionic level infarction (caudate, lentiform nuclei, internal
capsule, insula, M1-M3 cortex): 7

- Supraganglionic infarction (M4-M6 cortex): 3

Total score (0-10 with 10 being normal): 10
IMPRESSION: 1. No acute hemorrhage or mass lesion.
2. ASPECTS is 10.

These results were communicated to Dr. Perionica Pantovic Panto at [DATE] on
11/09/2017 by text page via the [employer] messaging system.

## 2019-04-29 ENCOUNTER — Other Ambulatory Visit: Payer: Self-pay | Admitting: Family Medicine

## 2019-04-29 DIAGNOSIS — J439 Emphysema, unspecified: Secondary | ICD-10-CM

## 2019-05-24 DIAGNOSIS — H25013 Cortical age-related cataract, bilateral: Secondary | ICD-10-CM | POA: Diagnosis not present

## 2019-05-24 DIAGNOSIS — H25043 Posterior subcapsular polar age-related cataract, bilateral: Secondary | ICD-10-CM | POA: Diagnosis not present

## 2019-05-24 DIAGNOSIS — H2513 Age-related nuclear cataract, bilateral: Secondary | ICD-10-CM | POA: Diagnosis not present

## 2019-05-24 DIAGNOSIS — H02035 Senile entropion of left lower eyelid: Secondary | ICD-10-CM | POA: Diagnosis not present

## 2020-05-21 ENCOUNTER — Other Ambulatory Visit: Payer: Self-pay | Admitting: Family Medicine

## 2020-05-21 DIAGNOSIS — J439 Emphysema, unspecified: Secondary | ICD-10-CM

## 2020-10-01 ENCOUNTER — Encounter: Payer: Medicare Other | Admitting: Family Medicine

## 2020-10-05 ENCOUNTER — Other Ambulatory Visit: Payer: Self-pay

## 2020-10-05 ENCOUNTER — Ambulatory Visit (INDEPENDENT_AMBULATORY_CARE_PROVIDER_SITE_OTHER): Payer: Medicare Other | Admitting: Family Medicine

## 2020-10-05 ENCOUNTER — Encounter: Payer: Self-pay | Admitting: Family Medicine

## 2020-10-05 VITALS — BP 196/110 | HR 80 | Temp 98.4°F | Wt 180.0 lb

## 2020-10-05 DIAGNOSIS — I10 Essential (primary) hypertension: Secondary | ICD-10-CM | POA: Diagnosis not present

## 2020-10-05 DIAGNOSIS — D3192 Benign neoplasm of unspecified part of left eye: Secondary | ICD-10-CM | POA: Diagnosis not present

## 2020-10-05 DIAGNOSIS — D4989 Neoplasm of unspecified behavior of other specified sites: Secondary | ICD-10-CM | POA: Diagnosis not present

## 2020-10-05 DIAGNOSIS — F17200 Nicotine dependence, unspecified, uncomplicated: Secondary | ICD-10-CM | POA: Diagnosis not present

## 2020-10-05 DIAGNOSIS — Z122 Encounter for screening for malignant neoplasm of respiratory organs: Secondary | ICD-10-CM

## 2020-10-05 DIAGNOSIS — Z136 Encounter for screening for cardiovascular disorders: Secondary | ICD-10-CM | POA: Diagnosis not present

## 2020-10-05 DIAGNOSIS — Z125 Encounter for screening for malignant neoplasm of prostate: Secondary | ICD-10-CM

## 2020-10-05 MED ORDER — HYDROCHLOROTHIAZIDE 25 MG PO TABS: 25.0000 mg | ORAL_TABLET | Freq: Every day | ORAL | 3 refills | Status: DC

## 2020-10-05 MED ORDER — AMLODIPINE BESYLATE 10 MG PO TABS: 10.0000 mg | ORAL_TABLET | Freq: Every day | ORAL | 3 refills | Status: DC

## 2020-10-05 NOTE — Progress Notes (Signed)
Subjective:    Patient ID: Brett Vasquez, male    DOB: [birth year], 75 y.o.   MRN: [id number]  Patient is scheduled today for complete physical exam however his blood pressure is extremely high.  Patient has not been seen in over 2 years.  He states that he has not been taking his blood pressure medication.  He denies any headache.  He denies any chest pain.  He denies any oliguria or hematuria or blurry vision.  He does report pressure and swelling around his left eye.  There is a fatty mass growing at the inferior border of his left eyeball behind the eyelid.  Please see the photographs below for comparison     If you compare the 2 photographs above, the photograph of the left shows a yellow soft spongy mass at the inferior border of the right eye that appears to be fat tissue.  I'm not certain if this is some kind of lipoma or benign fatty tumor however there is also swelling above the medial portion of the eyeball as well.  He denies any eye pain or blurry vision or double vision.  He has never had a colonoscopy.  He continues to smoke.  He has never had a CAT scan of his lung.  He is overdue for a PSA to screen for prostate cancer.  He has never had AAA screening.  Past Medical History:  Diagnosis Date   COPD (chronic obstructive pulmonary disease) ([place])    Hypertension    Smoker unmotivated to quit    No past surgical history on file. Current Outpatient Medications on File Prior to Visit  Medication Sig Dispense Refill   amLODipine (NORVASC) 10 MG tablet TAKE 1 TABLET BY MOUTH EVERY DAY 30 tablet 1   budesonide-formoterol (SYMBICORT) 160-4.5 MCG/ACT inhaler INHALE 2 PUFFS INTO THE LUNGS TWICE DAILY 1 each 18   hydrochlorothiazide (HYDRODIURIL) 25 MG tablet TAKE 1 TABLET BY MOUTH EVERY DAY 90 tablet 3   No current facility-administered medications on file prior to visit.   No Known Allergies Social History   Socioeconomic History   Marital status: Married    Spouse name:  Not on file   Number of children: Not on file   Years of education: Not on file   Highest education level: Not on file  Occupational History   Not on file  Tobacco Use   Smoking status: Current Every Day Smoker    Packs/day: 1.00    Years: 60.00    Pack years: 60.00    Types: Cigarettes   Smokeless tobacco: Never Used  Vaping Use   Vaping Use: Never used  Substance and Sexual Activity   Alcohol use: No   Drug use: No   Sexual activity: Not on file    Comment: divorced, 3 grown children  Other Topics Concern   Not on file  Social History Narrative   ** Merged History Encounter **       Social Determinants of Health   Financial Resource Strain: Not on file  Food Insecurity: Not on file  Transportation Needs: Not on file  Physical Activity: Not on file  Stress: Not on file  Social Connections: Not on file  Intimate Partner Violence: Not on file   Family History  Family history unknown: Yes      Review of Systems  All other systems reviewed and are negative.      Objective:   Physical Exam Vitals reviewed.  Constitutional:  General: He is not in acute distress.    Appearance: He is well-developed. He is not diaphoretic.  HENT:     Head: Normocephalic and atraumatic.     Right Ear: External ear normal.     Left Ear: External ear normal.     Nose: Nose normal.     Mouth/Throat:     Pharynx: No oropharyngeal exudate.  Eyes:     General: No scleral icterus.       Right eye: No discharge.        Left eye: No discharge.     Conjunctiva/sclera: Conjunctivae normal.     Pupils: Pupils are equal, round, and reactive to light.  Neck:     Thyroid: No thyromegaly.     Vascular: No JVD.     Trachea: No tracheal deviation.  Cardiovascular:     Rate and Rhythm: Normal rate and regular rhythm.     Heart sounds: Normal heart sounds. No murmur heard. No gallop.   Pulmonary:     Effort: Pulmonary effort is normal. No respiratory distress.     Breath  sounds: No stridor. Decreased breath sounds present. No wheezing or rales.  Abdominal:     General: Bowel sounds are normal. There is no distension.     Palpations: Abdomen is soft. There is no mass.     Tenderness: There is no abdominal tenderness. There is no guarding or rebound.  Musculoskeletal:        General: No tenderness. Normal range of motion.     Cervical back: Normal range of motion and neck supple.  Lymphadenopathy:     Cervical: No cervical adenopathy.  Skin:    General: Skin is warm.     Coloration: Skin is not pale.     Findings: No erythema or rash.  Neurological:     Mental Status: He is alert and oriented to person, place, and time.     Cranial Nerves: No cranial nerve deficit.     Motor: No abnormal muscle tone.     Coordination: Coordination normal.     Deep Tendon Reflexes: Reflexes are normal and symmetric.  Psychiatric:        Behavior: Behavior normal.        Thought Content: Thought content normal.        Judgment: Judgment normal.           Assessment & Plan:  Benign essential HTN - Plan: CBC with Differential/Platelet, COMPLETE METABOLIC PANEL WITH GFR, Lipid panel  Prostate cancer screening - Plan: PSA  Smoker unmotivated to quit - Plan: CT CHEST LUNG CA SCREEN LOW DOSE W/O CM  Tumor of eye socket - Plan: CT ORBITS W CONTRAST  Encounter for abdominal aortic aneurysm (AAA) screening - Plan: VAS [place] AAA DUPLEX  Screening for lung cancer - Plan: CT CHEST LUNG CA SCREEN LOW DOSE W/O CM  Benign neoplasm of left eye - Plan: CT ORBITS W CONTRAST  Patient is long overdue for numerous health screening measures.  My biggest concern is his blood pressure.  Resume amlodipine 10 mg a day and hydrochlorothiazide 25 mg a day and recheck blood pressure on Monday.  My second concern is the mass growing in the corner of his left eye.  Obtain a CT scan of the orbit to evaluate further to determine if there is extension into the retro-orbital space and to help  determine the best method for treatment prior to consulting ophthalmology.  Third schedule the patient for lung cancer  CT screen.  Fourth schedule the patient for AAA screen.  Hold off on a colonoscopy until we get the blood pressure better controlled.  Screen for prostate cancer.  Check CBC, CMP, lipid panel, and PSA.  Recheck here on Monday.  Refuses COVID shot, pneumonia shot, and flu shot.

## 2020-10-06 LAB — COMPLETE METABOLIC PANEL WITH GFR
AG Ratio: 1.8 (calc) (ref 1.0–2.5)
ALT: 6 U/L — ABNORMAL LOW (ref 9–46)
AST: 13 U/L (ref 10–35)
Albumin: 4.1 g/dL (ref 3.6–5.1)
Alkaline phosphatase (APISO): 97 U/L (ref 35–144)
BUN: 13 mg/dL (ref 7–25)
CO2: 26 mmol/L (ref 20–32)
Calcium: 9.4 mg/dL (ref 8.6–10.3)
Chloride: 106 mmol/L (ref 98–110)
Creat: 1.04 mg/dL (ref 0.70–1.18)
GFR, Est African American: 80 mL/min/{1.73_m2} (ref >=60)
GFR, Est Non African American: 69 mL/min/{1.73_m2} (ref >=60)
Globulin: 2.3 g/dL (calc) (ref 1.9–3.7)
Glucose, Bld: 86 mg/dL (ref 65–99)
Potassium: 3.3 mmol/L — ABNORMAL LOW (ref 3.5–5.3)
Sodium: 144 mmol/L (ref 135–146)
Total Bilirubin: 0.8 mg/dL (ref 0.2–1.2)
Total Protein: 6.4 g/dL (ref 6.1–8.1)

## 2020-10-06 LAB — CBC WITH DIFFERENTIAL/PLATELET
Absolute Monocytes: 503 cells/uL (ref 200–950)
Basophils Absolute: 83 cells/uL (ref 0–200)
Basophils Relative: 1.1 %
Eosinophils Absolute: 188 cells/uL (ref 15–500)
Eosinophils Relative: 2.5 %
HCT: 41.3 % (ref 38.5–50.0)
Hemoglobin: 13.2 g/dL (ref 13.2–17.1)
Lymphs Abs: 1605 cells/uL (ref 850–3900)
MCH: 26.8 pg — ABNORMAL LOW (ref 27.0–33.0)
MCHC: 32 g/dL (ref 32.0–36.0)
MCV: 83.9 fL (ref 80.0–100.0)
MPV: 9.7 fL (ref 7.5–12.5)
Monocytes Relative: 6.7 %
Neutro Abs: 5123 cells/uL (ref 1500–7800)
Neutrophils Relative %: 68.3 %
Platelets: 296 10*3/uL (ref 140–400)
RBC: 4.92 10*6/uL (ref 4.20–5.80)
RDW: 14.8 % (ref 11.0–15.0)
Total Lymphocyte: 21.4 %
WBC: 7.5 10*3/uL (ref 3.8–10.8)

## 2020-10-06 LAB — LIPID PANEL
Cholesterol: 174 mg/dL (ref <200)
HDL: 67 mg/dL (ref >=40)
LDL Cholesterol (Calc): 90 mg/dL (calc)
Non-HDL Cholesterol (Calc): 107 mg/dL (calc) (ref <130)
Total CHOL/HDL Ratio: 2.6 (calc) (ref <5.0)
Triglycerides: 80 mg/dL (ref <150)

## 2020-10-06 LAB — PSA: PSA: 1.3 ng/mL (ref <=4.0)

## 2020-10-09 ENCOUNTER — Ambulatory Visit (INDEPENDENT_AMBULATORY_CARE_PROVIDER_SITE_OTHER): Payer: Medicare Other | Admitting: Family Medicine

## 2020-10-09 ENCOUNTER — Encounter: Payer: Self-pay | Admitting: Family Medicine

## 2020-10-09 ENCOUNTER — Other Ambulatory Visit: Payer: Self-pay

## 2020-10-09 VITALS — BP 130/80 | HR 85 | Temp 98.1°F | Ht 67.0 in | Wt 173.0 lb

## 2020-10-09 DIAGNOSIS — I10 Essential (primary) hypertension: Secondary | ICD-10-CM

## 2020-10-09 MED ORDER — POTASSIUM CHLORIDE ER 10 MEQ PO TBCR: 10.0000 meq | EXTENDED_RELEASE_TABLET | Freq: Every day | ORAL | 5 refills | Status: DC

## 2020-10-09 NOTE — Progress Notes (Signed)
Subjective:    Patient ID: Brett Vasquez, male    DOB: 1946/02/15, 75 y.o.   MRN: [id number]  Patient is scheduled today for complete physical exam however his blood pressure is extremely high.  Patient has not been seen in over 2 years.  He states that he has not been taking his blood pressure medication.  He denies any headache.  He denies any chest pain.  He denies any oliguria or hematuria or blurry vision.  He does report pressure and swelling around his left eye.  There is a fatty mass growing at the inferior border of his left eyeball behind the eyelid.  Please see the photographs below for comparison     If you compare the 2 photographs above, the photograph of the left shows a yellow soft spongy mass at the inferior border of the right eye that appears to be fat tissue.  I'm not certain if this is some kind of lipoma or benign fatty tumor however there is also swelling above the medial portion of the eyeball as well.  He denies any eye pain or blurry vision or double vision.  He has never had a colonoscopy.  He continues to smoke.  He has never had a CAT scan of his lung.  He is overdue for a PSA to screen for prostate cancer.  He has never had AAA screening.  At that time, my plan was: Patient is long overdue for numerous health screening measures.  My biggest concern is his blood pressure.  Resume amlodipine 10 mg a day and hydrochlorothiazide 25 mg a day and recheck blood pressure on Monday.  My second concern is the mass growing in the corner of his left eye.  Obtain a CT scan of the orbit to evaluate further to determine if there is extension into the retro-orbital space and to help determine the best method for treatment prior to consulting ophthalmology.  Third schedule the patient for lung cancer CT screen.  Fourth schedule the patient for AAA screen.  Hold off on a colonoscopy until we get the blood pressure better controlled.  Screen for prostate cancer.  Check CBC, CMP, lipid panel,  and PSA.  Recheck here on Monday.  Refuses COVID shot, pneumonia shot, and flu shot.  10/09/20 Patient is here today to recheck his blood pressure.  He is taking amlodipine and hydrochlorothiazide.  I personally rechecked his blood pressure myself and found it to be 130/80.  He denies any side effects from the medication.  He denies any lightheadedness or dizziness.  He denies any chest pain or shortness of breath.  His most recent lab work is listed below: Office Visit on 10/05/2020  Component Date Value Ref Range Status  . WBC 10/05/2020 7.5  3.8 - 10.8 Thousand/uL Final  . RBC 10/05/2020 4.92  4.20 - 5.80 Million/uL Final  . Hemoglobin 10/05/2020 13.2  13.2 - 17.1 g/dL Final  . HCT 10/05/2020 41.3  38.5 - 50.0 % Final  . MCV 10/05/2020 83.9  80.0 - 100.0 fL Final  . MCH 10/05/2020 26.8* 27.0 - 33.0 pg Final  . MCHC 10/05/2020 32.0  32.0 - 36.0 g/dL Final  . RDW 10/05/2020 14.8  11.0 - 15.0 % Final  . Platelets 10/05/2020 296  140 - 400 Thousand/uL Final  . MPV 10/05/2020 9.7  7.5 - 12.5 fL Final  . Neutro Abs 10/05/2020 5,123  1,500 - 7,800 cells/uL Final  . Lymphs Abs 10/05/2020 1,605  850 - 3,900 cells/uL  Final  . Absolute Monocytes 10/05/2020 503  200 - 950 cells/uL Final  . Eosinophils Absolute 10/05/2020 188  15 - 500 cells/uL Final  . Basophils Absolute 10/05/2020 83  0 - 200 cells/uL Final  . Neutrophils Relative % 10/05/2020 68.3  % Final  . Total Lymphocyte 10/05/2020 21.4  % Final  . Monocytes Relative 10/05/2020 6.7  % Final  . Eosinophils Relative 10/05/2020 2.5  % Final  . Basophils Relative 10/05/2020 1.1  % Final  . Glucose, Bld 10/05/2020 86  65 - 99 mg/dL Final   Comment: .            Fasting reference interval .   . BUN 10/05/2020 13  7 - 25 mg/dL Final  . Creat 10/05/2020 1.04  0.70 - 1.18 mg/dL Final   Comment: For patients >37 years of age, the reference limit for Creatinine is approximately 13% higher for people identified as African-American. .   . GFR,  Est Non African American 10/05/2020 69  > OR = 60 mL/min/1.47m2 Final  . GFR, Est African American 10/05/2020 80  > OR = 60 mL/min/1.47m2 Final  . BUN/Creatinine Ratio [id number] NOT APPLICABLE  6 - 22 (calc) Final  . Sodium 10/05/2020 144  135 - 146 mmol/L Final  . Potassium 10/05/2020 3.3* 3.5 - 5.3 mmol/L Final  . Chloride 10/05/2020 106  98 - 110 mmol/L Final  . CO2 10/05/2020 26  20 - 32 mmol/L Final  . Calcium 10/05/2020 9.4  8.6 - 10.3 mg/dL Final  . Total Protein 10/05/2020 6.4  6.1 - 8.1 g/dL Final  . Albumin 10/05/2020 4.1  3.6 - 5.1 g/dL Final  . Globulin 10/05/2020 2.3  1.9 - 3.7 g/dL (calc) Final  . AG Ratio 10/05/2020 1.8  1.0 - 2.5 (calc) Final  . Total Bilirubin 10/05/2020 0.8  0.2 - 1.2 mg/dL Final  . Alkaline phosphatase (APISO) 10/05/2020 97  35 - 144 U/L Final  . AST 10/05/2020 13  10 - 35 U/L Final  . ALT 10/05/2020 6* 9 - 46 U/L Final  . Cholesterol 10/05/2020 174  <200 mg/dL Final  . HDL 10/05/2020 67  > OR = 40 mg/dL Final  . Triglycerides 10/05/2020 80  <150 mg/dL Final  . LDL Cholesterol (Calc) 10/05/2020 90  mg/dL (calc) Final   Comment: Reference range: <100 . Desirable range <100 mg/dL for primary prevention;   <70 mg/dL for patients with CHD or diabetic patients  with > or = 2 CHD risk factors. Marland Kitchen LDL-C is now calculated using the Martin-Hopkins  calculation, which is a validated novel method providing  better accuracy than the Friedewald equation in the  estimation of LDL-C.  Cresenciano Genre et al. Annamaria Helling. [id number]): 2061-2068  (http://education.[facility].com/faq/FAQ164)   . Total CHOL/HDL Ratio 10/05/2020 2.6  <5.0 (calc) Final  . Non-HDL Cholesterol (Calc) 10/05/2020 107  <130 mg/dL (calc) Final   Comment: For patients with diabetes plus 1 major ASCVD risk  factor, treating to a non-HDL-C goal of <100 mg/dL  (LDL-C of <70 mg/dL) is considered a therapeutic  option.   Marland Kitchen PSA 10/05/2020 1.30  < OR = 4.0 ng/mL Final   Comment: The total PSA value  from this assay system is  standardized against the WHO standard. The test  result will be approximately 20% lower when compared  to the equimolar-standardized total PSA (Beckman  Coulter). Comparison of serial PSA results should be  interpreted with this fact in mind. . This test was performed using the Siemens  chemiluminescent method. Values obtained from  different assay methods cannot be used interchangeably. PSA levels, regardless of value, should not be interpreted as absolute evidence of the presence or absence of disease.      Past Medical History:  Diagnosis Date  . COPD (chronic obstructive pulmonary disease) ([place])   . Hypertension   . Smoker unmotivated to quit    No past surgical history on file. Current Outpatient Medications on File Prior to Visit  Medication Sig Dispense Refill  . amLODipine (NORVASC) 10 MG tablet Take 1 tablet (10 mg total) by mouth daily. 90 tablet 3  . budesonide-formoterol (SYMBICORT) 160-4.5 MCG/ACT inhaler INHALE 2 PUFFS INTO THE LUNGS TWICE DAILY 1 each 18  . hydrochlorothiazide (HYDRODIURIL) 25 MG tablet Take 1 tablet (25 mg total) by mouth daily. 90 tablet 3   No current facility-administered medications on file prior to visit.   No Known Allergies Social History   Socioeconomic History  . Marital status: Married    Spouse name: Not on file  . Number of children: Not on file  . Years of education: Not on file  . Highest education level: Not on file  Occupational History  . Not on file  Tobacco Use  . Smoking status: Current Every Day Smoker    Packs/day: 1.00    Years: 60.00    Pack years: 60.00    Types: Cigarettes  . Smokeless tobacco: Never Used  Vaping Use  . Vaping Use: Never used  Substance and Sexual Activity  . Alcohol use: No  . Drug use: No  . Sexual activity: Not on file    Comment: divorced, 3 grown children  Other Topics Concern  . Not on file  Social History Narrative   ** Merged History Encounter **        Social Determinants of Health   Financial Resource Strain: Not on file  Food Insecurity: Not on file  Transportation Needs: Not on file  Physical Activity: Not on file  Stress: Not on file  Social Connections: Not on file  Intimate Partner Violence: Not on file   Family History  Family history unknown: Yes      Review of Systems  All other systems reviewed and are negative.      Objective:   Physical Exam Vitals reviewed.  Constitutional:      General: He is not in acute distress.    Appearance: He is well-developed. He is not diaphoretic.  HENT:     Head: Normocephalic and atraumatic.     Right Ear: External ear normal.     Left Ear: External ear normal.     Nose: Nose normal.     Mouth/Throat:     Pharynx: No oropharyngeal exudate.  Eyes:     General: No scleral icterus.       Right eye: No discharge.        Left eye: No discharge.     Conjunctiva/sclera: Conjunctivae normal.     Pupils: Pupils are equal, round, and reactive to light.  Neck:     Thyroid: No thyromegaly.     Vascular: No JVD.     Trachea: No tracheal deviation.  Cardiovascular:     Rate and Rhythm: Normal rate and regular rhythm.     Heart sounds: Normal heart sounds. No murmur heard. No gallop.   Pulmonary:     Effort: Pulmonary effort is normal. No respiratory distress.     Breath sounds: No stridor. Decreased breath sounds present. No  wheezing or rales.  Abdominal:     General: Bowel sounds are normal. There is no distension.     Palpations: Abdomen is soft. There is no mass.     Tenderness: There is no abdominal tenderness. There is no guarding or rebound.  Musculoskeletal:        General: No tenderness. Normal range of motion.     Cervical back: Normal range of motion and neck supple.  Lymphadenopathy:     Cervical: No cervical adenopathy.  Skin:    General: Skin is warm.     Coloration: Skin is not pale.     Findings: No erythema or rash.  Neurological:     Mental  Status: He is alert and oriented to person, place, and time.     Cranial Nerves: No cranial nerve deficit.     Motor: No abnormal muscle tone.     Coordination: Coordination normal.     Deep Tendon Reflexes: Reflexes are normal and symmetric.  Psychiatric:        Behavior: Behavior normal.        Thought Content: Thought content normal.        Judgment: Judgment normal.           Assessment & Plan:  Benign essential HTN  Blood pressure is now at goal.  Recheck lab work in 3 months.  Add K. Dur 10 mEq a day due to hypokalemia.  Recommended a flu shot as well as a pneumonia vaccine but he declined both.  Also recommended a COVID vaccine which she can get at [facility] or [facility].  Await the results of the CT scan of the orbit and his chest as I discussed in the assessment and plan from his last visit.  After we have determine what to do about the mass growing around his left eye and after I have obtained a CT scan of his lung, then I will refer to GI for possible colonoscopy

## 2020-10-29 ENCOUNTER — Inpatient Hospital Stay: Admission: RE | Admit: 2020-10-29 | Payer: No Typology Code available for payment source | Source: Ambulatory Visit

## 2020-10-29 ENCOUNTER — Ambulatory Visit: Payer: No Typology Code available for payment source

## 2020-12-06 ENCOUNTER — Ambulatory Visit (HOSPITAL_COMMUNITY): Admission: RE | Admit: 2020-12-06 | Payer: Medicare Other | Source: Ambulatory Visit

## 2021-01-08 ENCOUNTER — Ambulatory Visit: Payer: Medicare Other | Admitting: Family Medicine

## 2021-04-06 DIAGNOSIS — F17210 Nicotine dependence, cigarettes, uncomplicated: Secondary | ICD-10-CM | POA: Diagnosis not present

## 2021-04-06 DIAGNOSIS — J449 Chronic obstructive pulmonary disease, unspecified: Secondary | ICD-10-CM | POA: Diagnosis present

## 2021-04-06 DIAGNOSIS — Z131 Encounter for screening for diabetes mellitus: Secondary | ICD-10-CM | POA: Diagnosis not present

## 2021-04-06 DIAGNOSIS — R634 Abnormal weight loss: Secondary | ICD-10-CM | POA: Diagnosis not present

## 2021-04-15 DIAGNOSIS — R634 Abnormal weight loss: Secondary | ICD-10-CM | POA: Diagnosis not present

## 2021-06-08 ENCOUNTER — Other Ambulatory Visit: Payer: Self-pay | Admitting: Family Medicine

## 2021-06-08 DIAGNOSIS — J439 Emphysema, unspecified: Secondary | ICD-10-CM

## 2021-08-31 DIAGNOSIS — M542 Cervicalgia: Secondary | ICD-10-CM | POA: Diagnosis not present

## 2021-10-01 DIAGNOSIS — J449 Chronic obstructive pulmonary disease, unspecified: Secondary | ICD-10-CM | POA: Diagnosis not present

## 2021-10-01 DIAGNOSIS — R011 Cardiac murmur, unspecified: Secondary | ICD-10-CM | POA: Diagnosis not present

## 2021-10-01 DIAGNOSIS — I10 Essential (primary) hypertension: Secondary | ICD-10-CM | POA: Diagnosis not present

## 2021-10-01 DIAGNOSIS — R0600 Dyspnea, unspecified: Secondary | ICD-10-CM | POA: Diagnosis not present

## 2021-10-08 ENCOUNTER — Telehealth: Payer: Self-pay | Admitting: Family Medicine

## 2021-10-08 NOTE — Telephone Encounter (Signed)
I attempted to leave message for patient to call back and schedule Medicare Annual Wellness Visit (AWV) in office. No voice mail.  If not able to come in office, please offer to do virtually or by telephone.  Left office number and my jabber [phone].  Last AWV:10/05/2020  Please schedule at anytime with Nurse Health Advisor.

## 2021-10-09 ENCOUNTER — Other Ambulatory Visit: Payer: Self-pay

## 2021-10-09 MED ORDER — AMLODIPINE BESYLATE 10 MG PO TABS: 10.0000 mg | ORAL_TABLET | Freq: Every day | ORAL | 0 refills | Status: DC

## 2021-10-22 DIAGNOSIS — I10 Essential (primary) hypertension: Secondary | ICD-10-CM | POA: Diagnosis not present

## 2021-10-22 DIAGNOSIS — J449 Chronic obstructive pulmonary disease, unspecified: Secondary | ICD-10-CM | POA: Diagnosis not present

## 2021-10-22 DIAGNOSIS — R011 Cardiac murmur, unspecified: Secondary | ICD-10-CM | POA: Diagnosis not present

## 2021-11-01 ENCOUNTER — Ambulatory Visit: Payer: Medicare Other | Admitting: Cardiology

## 2021-11-01 ENCOUNTER — Other Ambulatory Visit: Payer: Self-pay

## 2021-11-01 ENCOUNTER — Encounter: Payer: Self-pay | Admitting: Cardiology

## 2021-11-01 VITALS — BP 167/95 | HR 79 | Temp 98.3°F | Resp 16 | Ht 67.0 in | Wt 156.0 lb

## 2021-11-01 DIAGNOSIS — F17200 Nicotine dependence, unspecified, uncomplicated: Secondary | ICD-10-CM

## 2021-11-01 DIAGNOSIS — F17210 Nicotine dependence, cigarettes, uncomplicated: Secondary | ICD-10-CM | POA: Diagnosis not present

## 2021-11-01 DIAGNOSIS — R0609 Other forms of dyspnea: Secondary | ICD-10-CM

## 2021-11-01 DIAGNOSIS — R011 Cardiac murmur, unspecified: Secondary | ICD-10-CM

## 2021-11-01 DIAGNOSIS — I10 Essential (primary) hypertension: Secondary | ICD-10-CM

## 2021-11-01 NOTE — Progress Notes (Signed)
Date:  11/01/2021   ID:  Brett Vasquez, DOB [birth year], MRN [id number]  PCP:  Susy Frizzle, MD  Cardiologist:  Rex Kras, DO, [facility] (established care 11/01/2021)  REASON FOR CONSULT: Cardiac murmur  REQUESTING PHYSICIAN:  Susy Frizzle, MD [address] [place],  [place] [address]  Chief Complaint  Patient presents with   Heart Murmur   New Patient (Initial Visit)    HPI  Brett Vasquez is a 76 y.o. African-American male who presents to the office with a chief complaint of "cardiac murmur." Patient's past medical history and cardiovascular risk factors include: Hypertension, COPD, cigarette smoker.  He is referred to the office at the request of Susy Frizzle, MD for evaluation of cardiac murmur. Patient states that he is overall asymptomatic and denies any chest pain, orthopnea, paroxysmal nocturnal dyspnea or lower extremity swelling.  He does have shortness of breath predominantly with effort related activities which is contributing to COPD.  He continues to smoke half a pack per day.   FUNCTIONAL STATUS: No structured exercise program or daily routine.   ALLERGIES: No Known Allergies  MEDICATION LIST PRIOR TO VISIT: Current Meds  Medication Sig   albuterol (VENTOLIN HFA) 108 (90 Base) MCG/ACT inhaler Inhale 2 puffs into the lungs every 4 (four) hours.   amLODipine (NORVASC) 10 MG tablet Take 1 tablet (10 mg total) by mouth daily.   budesonide-formoterol (SYMBICORT) 160-4.5 MCG/ACT inhaler INHALE 2 PUFFS INTO LUNGS TWICE A DAY   potassium chloride (KLOR-CON) 10 MEQ tablet Take 1 tablet (10 mEq total) by mouth daily.     PAST MEDICAL HISTORY: Past Medical History:  Diagnosis Date   COPD (chronic obstructive pulmonary disease) ([place])    Hypertension    Smoker unmotivated to quit     PAST SURGICAL HISTORY: History reviewed. No pertinent surgical history.  FAMILY HISTORY: The patient family history includes Cancer in his sister.  SOCIAL HISTORY:   The patient  reports that he has been smoking cigarettes. He has been smoking an average of .5 packs per day. He has never used smokeless tobacco. He reports that he does not currently use drugs. He reports that he does not drink alcohol.  REVIEW OF SYSTEMS: Review of Systems  Cardiovascular:  Positive for dyspnea on exertion. Negative for chest pain, leg swelling, orthopnea, palpitations, paroxysmal nocturnal dyspnea and syncope.  Respiratory:  Positive for shortness of breath (chronic).    PHYSICAL EXAM: Vitals with BMI 11/01/2021 11/01/2021 10/09/2020  Height - 5\' 7"  5\' 7"   Weight - 156 lbs 173 lbs  BMI - 62.70 35.00  Systolic 938 182 993  Diastolic 95 97 80  Pulse 79 70 85    CONSTITUTIONAL: Appears older than stated age, hemodynamically stable, no acute distress.   SKIN: Skin is warm and dry. No rash noted. No cyanosis. No pallor. No jaundice HEAD: Normocephalic and atraumatic.  EYES: No scleral icterus MOUTH/THROAT: Moist oral membranes.  NECK: No JVD present. No thyromegaly noted. No carotid bruits  LYMPHATIC: No visible cervical adenopathy.  CHEST Normal respiratory effort. No intercostal retractions  LUNGS: Decreased breath sounds bilaterally.  No stridor. No wheezes. No rales.  CARDIOVASCULAR: Regular rate and rhythm, positive Z1-I9, soft holosystolic murmur heard at the apex, no rubs or gallops appreciated. ABDOMINAL: Soft, nontender, nondistended, positive bowel sounds in all 4 quadrants.  No apparent ascites.  EXTREMITIES: No peripheral edema, warm to touch, diminished bilateral DP and PT pulses HEMATOLOGIC: No significant bruising NEUROLOGIC: Oriented  to person, place, and time. Nonfocal. Normal muscle tone.  PSYCHIATRIC: Normal mood and affect. Normal behavior. Cooperative  CARDIAC DATABASE: EKG: 11/01/2021: NSR, 86 bpm, left axis, left anterior fascicular block, nonspecific ST depressions.  Echocardiogram: No results found for this or any previous visit from the  past 1095 days.    Stress Testing: No results found for this or any previous visit from the past 1095 days.   Heart Catheterization: None  LABORATORY DATA: CBC Latest Ref Rng & Units 10/05/2020 04/19/2018 11/10/2017  WBC 3.8 - 10.8 Thousand/uL 7.5 7.4 9.3  Hemoglobin 13.2 - 17.1 g/dL 13.2 14.9 14.3  Hematocrit 38.5 - 50.0 % 41.3 44.7 43.5  Platelets 140 - 400 Thousand/uL 296 237 227    CMP Latest Ref Rng & Units 10/05/2020 04/19/2018 11/10/2017  Glucose 65 - 99 mg/dL 86 92 84  BUN 7 - 25 mg/dL 13 13 15   Creatinine 0.70 - 1.18 mg/dL 1.04 1.03 1.09  Sodium 135 - 146 mmol/L 144 142 143  Potassium 3.5 - 5.3 mmol/L 3.3(L) 3.5 3.7  Chloride 98 - 110 mmol/L 106 107 110  CO2 20 - 32 mmol/L 26 28 23   Calcium 8.6 - 10.3 mg/dL 9.4 8.9 9.1  Total Protein 6.1 - 8.1 g/dL 6.4 6.4 6.0(L)  Total Bilirubin 0.2 - 1.2 mg/dL 0.8 0.7 1.1  Alkaline Phos 38 - 126 U/L - - 89  AST 10 - 35 U/L 13 14 25   ALT 9 - 46 U/L 6(L) 7(L) 14(L)    Lipid Panel     Component Value Date/Time   CHOL 174 10/05/2020 0957   TRIG 80 10/05/2020 0957   HDL 67 10/05/2020 0957   CHOLHDL 2.6 10/05/2020 0957   VLDL 12 11/10/2017 0521   LDLCALC 90 10/05/2020 0957    No components found for: NTPROBNP No results for input(s): PROBNP in the last 8760 hours. No results for input(s): TSH in the last 8760 hours.  BMP No results for input(s): NA, K, CL, CO2, GLUCOSE, BUN, CREATININE, CALCIUM, GFRNONAA, GFRAA in the last 8760 hours.  HEMOGLOBIN A1C Lab Results  Component Value Date   HGBA1C 5.5 11/10/2017   MPG 111 11/10/2017    IMPRESSION:    ICD-10-CM   1. Heart murmur  R01.1 EKG 12-Lead    PCV ECHOCARDIOGRAM COMPLETE    2. Dyspnea on exertion  R06.09 Hemoglobin and hematocrit, blood    PCV ECHOCARDIOGRAM COMPLETE    TSH    Pro b natriuretic peptide (BNP)    CANCELED: Pro b natriuretic peptide (BNP)    3. Essential hypertension  I09 Basic metabolic panel    4. Smoker unmotivated to quit  F17.200         RECOMMENDATIONS: Brett Vasquez is a 76 y.o. African-American male whose past medical history and cardiac risk factors include: Hypertension, COPD, cigarette smoker.  Patient presents today to establish care with cardiology with concerns for a cardiac murmur and shortness of breath.  Patient states that shortness of breath has been chronic but appears to be progressive.  He is attributing his symptoms to current cigarette smoking as well as underlying COPD.  He is currently using his Symbicort.  I explained to him that his shortness of breath is multifactorial given his underlying COPD, cigarette smoking, blood pressure not well controlled, etc.  On auscultation he does have a soft systolic murmur predominantly best auscultated at the apex.  Plan echocardiogram to evaluate for LVEF and valvular heart disease.  Difficult to uptitrate antihypertensive medications  as he is not aware of exactly what he is taking.  Medications were not reconciled appropriately during today's office visit as he did not bring a medication list, medication bottles, nor does he recall them.  I have asked him to reach out to his PCP for further medication titration.   No recent labs available for review except from July 2022 which were reviewed in [place].  Will order BMP, hemoglobin and hematocrit, BMP, TSH.  Tobacco cessation counseling: Currently smoking 0.5 packs/day   Currently has COPD and on inhalers.  Denies claudication. Patient is not willing to quit at this time. 5 mins were spent counseling patient cessation techniques. We discussed various methods to help quit smoking, including deciding on a date to quit, joining a support group, pharmacological agents- nicotine gum/patch/lozenges.  I will reassess his progress at the next follow-up visit  We discussed undergoing ischemic work-up given his subtle ST depressions noted on EKG.  However, patient would like to hold off for now.  In the meantime would  recommend improving his modifiable cardiovascular risk factors including better blood pressure management, working on smoking cessation, increasing physical activity as tolerated with a goal of 30 minutes a day 5 days a week.  As part of today's office visit reviewed external labs available in [place], external office notes provided by Ms. Millsap, discussed disease management, ordered and interpreted EKG, discussed plan of care, ordering additional diagnostic testing for further work-up (echo, labs).    FINAL MEDICATION LIST END OF ENCOUNTER: No orders of the defined types were placed in this encounter.   There are no discontinued medications.   Current Outpatient Medications:    albuterol (VENTOLIN HFA) 108 (90 Base) MCG/ACT inhaler, Inhale 2 puffs into the lungs every 4 (four) hours., Disp: , Rfl:    amLODipine (NORVASC) 10 MG tablet, Take 1 tablet (10 mg total) by mouth daily., Disp: 90 tablet, Rfl: 0   budesonide-formoterol (SYMBICORT) 160-4.5 MCG/ACT inhaler, INHALE 2 PUFFS INTO LUNGS TWICE A DAY, Disp: 10.2 each, Rfl: 18   potassium chloride (KLOR-CON) 10 MEQ tablet, Take 1 tablet (10 mEq total) by mouth daily., Disp: 30 tablet, Rfl: 5   hydrochlorothiazide (HYDRODIURIL) 25 MG tablet, Take 1 tablet (25 mg total) by mouth daily., Disp: 90 tablet, Rfl: 3   lisinopril-hydrochlorothiazide (ZESTORETIC) 10-12.5 MG tablet, lisinopril 10 mg-hydrochlorothiazide 12.5 mg tablet  TAKE 1 TABLET BY MOUTH EVERY DAY, Disp: , Rfl:   Orders Placed This Encounter  Procedures   Basic metabolic panel   Hemoglobin and hematocrit, blood   TSH   Pro b natriuretic peptide (BNP)   EKG 12-Lead   PCV ECHOCARDIOGRAM COMPLETE    There are no Patient Instructions on file for this visit.   --Continue cardiac medications as reconciled in final medication list. --Return in about 4 weeks (around 11/29/2021) for Follow up, Dyspnea, Echo. Or sooner if needed. --Continue follow-up with your primary care  physician regarding the management of your other chronic comorbid conditions.  Patient's questions and concerns were addressed to his satisfaction. He voices understanding of the instructions provided during this encounter.   This note was created using a voice recognition software as a result there may be grammatical errors inadvertently enclosed that do not reflect the nature of this encounter. Every attempt is made to correct such errors.  Rex Kras, [place], [facility]  Pager: [phone] Office: [phone]

## 2021-11-10 ENCOUNTER — Encounter: Payer: Self-pay | Admitting: Cardiology

## 2021-11-11 ENCOUNTER — Other Ambulatory Visit: Payer: Self-pay

## 2021-11-11 ENCOUNTER — Ambulatory Visit: Payer: Medicare Other

## 2021-11-11 DIAGNOSIS — R011 Cardiac murmur, unspecified: Secondary | ICD-10-CM

## 2021-11-11 DIAGNOSIS — R0609 Other forms of dyspnea: Secondary | ICD-10-CM | POA: Diagnosis not present

## 2021-11-27 NOTE — Progress Notes (Signed)
Called patient, the home number is incorrect and when I called the mobile,  message prompted " Wireless customer not available, please try again later".

## 2021-11-29 ENCOUNTER — Ambulatory Visit: Payer: Medicare Other | Admitting: Cardiology

## 2021-11-29 ENCOUNTER — Encounter: Payer: Self-pay | Admitting: Cardiology

## 2021-11-29 ENCOUNTER — Other Ambulatory Visit: Payer: Self-pay

## 2021-11-29 VITALS — BP 144/83 | HR 77 | Temp 97.3°F | Resp 16 | Ht 67.0 in | Wt 154.0 lb

## 2021-11-29 DIAGNOSIS — I77819 Aortic ectasia, unspecified site: Secondary | ICD-10-CM | POA: Diagnosis not present

## 2021-11-29 DIAGNOSIS — F17200 Nicotine dependence, unspecified, uncomplicated: Secondary | ICD-10-CM | POA: Diagnosis not present

## 2021-11-29 DIAGNOSIS — I10 Essential (primary) hypertension: Secondary | ICD-10-CM

## 2021-11-29 DIAGNOSIS — I351 Nonrheumatic aortic (valve) insufficiency: Secondary | ICD-10-CM | POA: Diagnosis not present

## 2021-11-29 MED ORDER — LOSARTAN POTASSIUM 50 MG PO TABS: 50.0000 mg | ORAL_TABLET | Freq: Every day | ORAL | 0 refills | Status: DC

## 2021-11-29 NOTE — Progress Notes (Signed)
? ?ID:  Brett Vasquez, Brett Vasquez Nov 15, 1945, MRN [id number] ? ?PCP:  Susy Frizzle, MD  ?Cardiologist:  Rex Kras, DO, [facility] (established care 11/01/2021) ? ?Date: 11/29/21 ?Last Office Visit: 11/01/2021 ? ?Chief Complaint  ?Patient presents with  ? Results  ? Follow-up  ? ? ?HPI  ?Brett Vasquez is a 76 y.o. African-American male whose past medical history and cardiovascular risk factors include: Hypertension, COPD, cigarette smoker, aortic regurgitation, dilated aorta. ? ?Patient was referred to the practice at the request of his PCP for evaluation and management of a cardiac murmur.  Since last office visit he underwent an echocardiogram which notes preserved LVEF, mild aortic regurgitation and mild tricuspid regurgitation.  There is a concern for possible dilated thoracic aorta versus versus other pathology within the mediastinum. ? ?Patient does not recall having a history of a dilated aorta per review of electronic medical record notes that he had a CT PE protocol back in 2012 which did not mention a dilated aorta involving the ascending aorta through the descending thoracic aortic arch results noted below for further reference. ? ?Office blood pressures are not well controlled.  He does not check home blood pressures.  And he continues to smoke at least half a pack per day. ? ?FUNCTIONAL STATUS: ?No structured exercise program or daily routine.  ? ?ALLERGIES: ?No Known Allergies ? ?MEDICATION LIST PRIOR TO VISIT: ?Current Meds  ?Medication Sig  ? albuterol (VENTOLIN HFA) 108 (90 Base) MCG/ACT inhaler Inhale 2 puffs into the lungs every 4 (four) hours.  ? amLODipine (NORVASC) 10 MG tablet Take 1 tablet (10 mg total) by mouth daily.  ? budesonide-formoterol (SYMBICORT) 160-4.5 MCG/ACT inhaler INHALE 2 PUFFS INTO LUNGS TWICE A DAY  ? hydrochlorothiazide (HYDRODIURIL) 25 MG tablet Take 1 tablet (25 mg total) by mouth daily.  ? losartan (COZAAR) 50 MG tablet Take 1 tablet (50 mg total) by mouth daily at 10 pm.  ?  potassium chloride (KLOR-CON) 10 MEQ tablet Take 1 tablet (10 mEq total) by mouth daily.  ?  ? ?PAST MEDICAL HISTORY: ?Past Medical History:  ?Diagnosis Date  ? COPD (chronic obstructive pulmonary disease) ([place])   ? Hypertension   ? Smoker unmotivated to quit   ? ? ?PAST SURGICAL HISTORY: ?History reviewed. No pertinent surgical history. ? ?FAMILY HISTORY: ?The patient family history includes Cancer in his sister. ? ?SOCIAL HISTORY:  ?The patient  reports that he has been smoking cigarettes. He has been smoking an average of .5 packs per day. He has never used smokeless tobacco. He reports that he does not currently use drugs. He reports that he does not drink alcohol. ? ?REVIEW OF SYSTEMS: ?Review of Systems  ?Cardiovascular:  Positive for dyspnea on exertion. Negative for chest pain, leg swelling, orthopnea, palpitations, paroxysmal nocturnal dyspnea and syncope.  ?Respiratory:  Positive for shortness of breath (chronic).   ? ?PHYSICAL EXAM: ?Vitals with BMI 11/29/2021 11/01/2021 11/01/2021  ?Height '5\' 7"'$  - '5\' 7"'$   ?Weight 154 lbs - 156 lbs  ?BMI 24.11 - 24.43  ?Systolic 846 962 952  ?Diastolic 83 95 97  ?Pulse 77 79 70  ? ? ?CONSTITUTIONAL: Appears older than stated age, hemodynamically stable, no acute distress.   ?SKIN: Skin is warm and dry. No rash noted. No cyanosis. No pallor. No jaundice ?HEAD: Normocephalic and atraumatic.  ?EYES: No scleral icterus ?MOUTH/THROAT: Moist oral membranes.  ?NECK: No JVD present. No thyromegaly noted. No carotid bruits  ?LYMPHATIC: No visible cervical adenopathy.  ?CHEST Normal respiratory  effort. No intercostal retractions  ?LUNGS: Decreased breath sounds bilaterally.  No stridor. No wheezes. No rales.  ?CARDIOVASCULAR: Regular rate and rhythm, positive I2-L7, soft holosystolic murmur heard at the apex, no rubs or gallops appreciated. ?ABDOMINAL: Soft, nontender, nondistended, positive bowel sounds in all 4 quadrants.  No apparent ascites.  ?EXTREMITIES: No peripheral edema, warm  to touch, diminished bilateral DP and PT pulses ?HEMATOLOGIC: No significant bruising ?NEUROLOGIC: Oriented to person, place, and time. Nonfocal. Normal muscle tone.  ?PSYCHIATRIC: Normal mood and affect. Normal behavior. Cooperative ? ?RADIOLOGY: ?CT PE protocol: ?12/06/2010: ?1.  Slight fusiform dilatation of the ascending through descending thoracic aortic arch as described with no complicating feature.  ?2.  Slight cardiomegaly.  ?3.  Upper lobe centrilobular emphysema.  ?4.  Otherwise, no significant abnormality.   ? ?CARDIAC DATABASE: ?EKG: ?11/01/2021: NSR, 86 bpm, left axis, left anterior fascicular block, nonspecific ST depressions. ? ?Echocardiogram: ?11/11/2021:  ?Normal LV systolic function with visual EF 50-55%. Left ventricle cavity is normal in size. Normal global wall motion. ?Left atrial cavity is normal in size. There is a extrinsic compression of the left atrium, I cannot exclude presence of either a coronary artery aneurysm versus aortic aneurysm versus other noncardiac structure. See enclosed image.  ?Trileaflet aortic valve. Mild (Grade I) aortic regurgitation. ?Structurally normal tricuspid valve. Mild tricuspid regurgitation. No evidence of pulmonary hypertension. RVSP measures 30 mmHg. ?The aortic root is normal. Moderately dilated ascending aorta at 4.4 to 4.5 cm. ?Recommend CT Angiogram or MR of the chest.  ? ?Stress Testing: ?No results found for this or any previous visit from the past 1095 days. ? ?Heart Catheterization: ?None ? ?LABORATORY DATA: ?CBC Latest Ref Rng & Units 10/05/2020 04/19/2018 11/10/2017  ?WBC 3.8 - 10.8 Thousand/uL 7.5 7.4 9.3  ?Hemoglobin 13.2 - 17.1 g/dL 13.2 14.9 14.3  ?Hematocrit 38.5 - 50.0 % 41.3 44.7 43.5  ?Platelets 140 - 400 Thousand/uL 296 237 227  ? ? ?CMP Latest Ref Rng & Units 10/05/2020 04/19/2018 11/10/2017  ?Glucose 65 - 99 mg/dL 86 92 84  ?BUN 7 - 25 mg/dL '13 13 15  '$ ?Creatinine 0.70 - 1.18 mg/dL 1.04 1.03 1.09  ?Sodium 135 - 146 mmol/L 144 142 143  ?Potassium  3.5 - 5.3 mmol/L 3.3(L) 3.5 3.7  ?Chloride 98 - 110 mmol/L 106 107 110  ?CO2 20 - 32 mmol/L '26 28 23  '$ ?Calcium 8.6 - 10.3 mg/dL 9.4 8.9 9.1  ?Total Protein 6.1 - 8.1 g/dL 6.4 6.4 6.0(L)  ?Total Bilirubin 0.2 - 1.2 mg/dL 0.8 0.7 1.1  ?Alkaline Phos 38 - 126 U/L - - 89  ?AST 10 - 35 U/L '13 14 25  '$ ?ALT 9 - 46 U/L 6(L) 7(L) 14(L)  ? ? ?Lipid Panel  ?   ?Component Value Date/Time  ? CHOL 174 10/05/2020 0957  ? TRIG 80 10/05/2020 0957  ? HDL 67 10/05/2020 0957  ? CHOLHDL 2.6 10/05/2020 0957  ? VLDL 12 11/10/2017 0521  ? [place] 90 10/05/2020 0957  ? ? ?No components found for: NTPROBNP ?No results for input(s): PROBNP in the last 8760 hours. ?No results for input(s): TSH in the last 8760 hours. ? ?BMP ?No results for input(s): NA, K, CL, CO2, GLUCOSE, BUN, CREATININE, CALCIUM, GFRNONAA, GFRAA in the last 8760 hours. ? ?HEMOGLOBIN A1C ?Lab Results  ?Component Value Date  ? HGBA1C 5.5 11/10/2017  ? MPG 111 11/10/2017  ? ? ?IMPRESSION: ? ?  ICD-10-CM   ?1. Dilatation of aorta (HCC)  I77.819 CT Angio Chest/Abd/Pel for  Dissection W and/or W/WO  ?  losartan (COZAAR) 50 MG tablet  ?  Basic metabolic panel  ?  ?2. Nonrheumatic aortic valve insufficiency  I35.1   ?  ?3. Essential hypertension  I10 losartan (COZAAR) 50 MG tablet  ?  Basic metabolic panel  ?  ?4. Smoker unmotivated to quit  F17.200   ?  ?  ? ?RECOMMENDATIONS: ?EYOEL THROGMORTON is a 76 y.o. African-American male whose past medical history and cardiac risk factors include: Hypertension, COPD, cigarette smoker. ? ?Dilatation of aorta (HCC) ?Moderately dilated ascending aorta 44-45 mm on surface echocardiogram from February 2023. ?Patient was noted to have a dilated aorta back in 2012 based on CT PE protocol study -results noted and epic. ?We will check CTA chest abdomen pelvis dissection protocol to further evaluate the aortic dimensions.  ?Start losartan 50 mg p.o. every afternoon. ?Blood work in 1 week to evaluate kidney function. ? ?Nonrheumatic aortic valve  insufficiency ?Mild aortic regurgitation. ?Asymptomatic. ?Recommend follow-up echocardiogram in 3 to 5 years or sooner if change in clinical status ? ?Essential hypertension ?Office blood pressures are not well

## 2021-11-29 NOTE — Progress Notes (Signed)
Patient was seen today. Results discussed during office visit.

## 2021-12-16 ENCOUNTER — Other Ambulatory Visit: Payer: Self-pay

## 2021-12-16 DIAGNOSIS — I10 Essential (primary) hypertension: Secondary | ICD-10-CM

## 2021-12-16 DIAGNOSIS — I77819 Aortic ectasia, unspecified site: Secondary | ICD-10-CM

## 2022-02-11 DIAGNOSIS — J449 Chronic obstructive pulmonary disease, unspecified: Secondary | ICD-10-CM | POA: Diagnosis not present

## 2022-02-11 DIAGNOSIS — D649 Anemia, unspecified: Secondary | ICD-10-CM | POA: Diagnosis not present

## 2022-02-11 DIAGNOSIS — I10 Essential (primary) hypertension: Secondary | ICD-10-CM | POA: Diagnosis not present

## 2022-02-21 ENCOUNTER — Other Ambulatory Visit: Payer: Self-pay | Admitting: Cardiology

## 2022-02-21 DIAGNOSIS — I10 Essential (primary) hypertension: Secondary | ICD-10-CM

## 2022-02-21 DIAGNOSIS — I77819 Aortic ectasia, unspecified site: Secondary | ICD-10-CM

## 2022-04-03 DIAGNOSIS — M1990 Unspecified osteoarthritis, unspecified site: Secondary | ICD-10-CM | POA: Insufficient documentation

## 2022-04-03 DIAGNOSIS — J449 Chronic obstructive pulmonary disease, unspecified: Secondary | ICD-10-CM | POA: Diagnosis not present

## 2022-04-03 DIAGNOSIS — I10 Essential (primary) hypertension: Secondary | ICD-10-CM | POA: Diagnosis not present

## 2022-04-03 DIAGNOSIS — Z79899 Other long term (current) drug therapy: Secondary | ICD-10-CM | POA: Diagnosis not present

## 2022-04-03 DIAGNOSIS — D649 Anemia, unspecified: Secondary | ICD-10-CM | POA: Diagnosis not present

## 2022-04-04 ENCOUNTER — Other Ambulatory Visit: Payer: Self-pay

## 2022-04-04 ENCOUNTER — Observation Stay (HOSPITAL_COMMUNITY)
Admission: EM | Admit: 2022-04-04 | Discharge: 2022-04-05 | Discharge status: Against Medical Advice | Payer: Medicare Other | Attending: Internal Medicine | Admitting: Internal Medicine

## 2022-04-04 ENCOUNTER — Encounter (HOSPITAL_COMMUNITY): Payer: Self-pay

## 2022-04-04 ENCOUNTER — Emergency Department (HOSPITAL_COMMUNITY): Payer: Medicare Other

## 2022-04-04 DIAGNOSIS — D509 Iron deficiency anemia, unspecified: Secondary | ICD-10-CM | POA: Diagnosis not present

## 2022-04-04 DIAGNOSIS — R5383 Other fatigue: Secondary | ICD-10-CM | POA: Diagnosis present

## 2022-04-04 DIAGNOSIS — F17210 Nicotine dependence, cigarettes, uncomplicated: Secondary | ICD-10-CM | POA: Insufficient documentation

## 2022-04-04 DIAGNOSIS — D649 Anemia, unspecified: Secondary | ICD-10-CM | POA: Diagnosis not present

## 2022-04-04 DIAGNOSIS — R531 Weakness: Secondary | ICD-10-CM | POA: Diagnosis not present

## 2022-04-04 DIAGNOSIS — J449 Chronic obstructive pulmonary disease, unspecified: Secondary | ICD-10-CM | POA: Diagnosis not present

## 2022-04-04 DIAGNOSIS — E876 Hypokalemia: Secondary | ICD-10-CM | POA: Insufficient documentation

## 2022-04-04 DIAGNOSIS — Z79899 Other long term (current) drug therapy: Secondary | ICD-10-CM | POA: Diagnosis not present

## 2022-04-04 DIAGNOSIS — I10 Essential (primary) hypertension: Secondary | ICD-10-CM | POA: Diagnosis present

## 2022-04-04 DIAGNOSIS — F17200 Nicotine dependence, unspecified, uncomplicated: Secondary | ICD-10-CM | POA: Diagnosis present

## 2022-04-04 HISTORY — DX: Pure hypercholesterolemia, unspecified: E78.00

## 2022-04-04 LAB — COMPREHENSIVE METABOLIC PANEL
ALT: 10 U/L (ref 0–44)
AST: 16 U/L (ref 15–41)
Albumin: 3.6 g/dL (ref 3.5–5.0)
Alkaline Phosphatase: 74 U/L (ref 38–126)
Anion gap: 8 (ref 5–15)
BUN: 18 mg/dL (ref 8–23)
CO2: 27 mmol/L (ref 22–32)
Calcium: 9 mg/dL (ref 8.9–10.3)
Chloride: 110 mmol/L (ref 98–111)
Creatinine, Ser: 0.77 mg/dL (ref 0.61–1.24)
GFR, Estimated: >60 mL/min (ref >60)
Glucose, Bld: 89 mg/dL (ref 70–99)
Potassium: 2.6 mmol/L — CL (ref 3.5–5.1)
Sodium: 145 mmol/L (ref 135–145)
Total Bilirubin: 1 mg/dL (ref 0.3–1.2)
Total Protein: 6.7 g/dL (ref 6.5–8.1)

## 2022-04-04 LAB — CBC WITH DIFFERENTIAL/PLATELET
Abs Immature Granulocytes: 0.02 10*3/uL (ref 0.00–0.07)
Basophils Absolute: 0.1 10*3/uL (ref 0.0–0.1)
Basophils Relative: 1 %
Eosinophils Absolute: 0.2 10*3/uL (ref 0.0–0.5)
Eosinophils Relative: 2 %
HCT: 22.3 % — ABNORMAL LOW (ref 39.0–52.0)
Hemoglobin: 5.5 g/dL — CL (ref 13.0–17.0)
Immature Granulocytes: 0 %
Lymphocytes Relative: 26 %
Lymphs Abs: 1.9 10*3/uL (ref 0.7–4.0)
MCH: 14.9 pg — ABNORMAL LOW (ref 26.0–34.0)
MCHC: 24.7 g/dL — ABNORMAL LOW (ref 30.0–36.0)
MCV: 60.3 fL — ABNORMAL LOW (ref 80.0–100.0)
Monocytes Absolute: 0.4 10*3/uL (ref 0.1–1.0)
Monocytes Relative: 6 %
Neutro Abs: 4.7 10*3/uL (ref 1.7–7.7)
Neutrophils Relative %: 65 %
Platelets: 366 10*3/uL (ref 150–400)
RBC: 3.7 MIL/uL — ABNORMAL LOW (ref 4.22–5.81)
RDW: 23.4 % — ABNORMAL HIGH (ref 11.5–15.5)
WBC: 7.2 10*3/uL (ref 4.0–10.5)
nRBC: 0 % (ref 0.0–0.2)

## 2022-04-04 LAB — IRON AND TIBC
Iron: 13 ug/dL — ABNORMAL LOW (ref 45–182)
Saturation Ratios: 3 % — ABNORMAL LOW (ref 17.9–39.5)
TIBC: 455 ug/dL — ABNORMAL HIGH (ref 250–450)
UIBC: 442 ug/dL

## 2022-04-04 LAB — RETICULOCYTES
Immature Retic Fract: 25 % — ABNORMAL HIGH (ref 2.3–15.9)
RBC.: 3.65 MIL/uL — ABNORMAL LOW (ref 4.22–5.81)
Retic Count, Absolute: 52.2 10*3/uL (ref 19.0–186.0)
Retic Ct Pct: 1.4 % (ref 0.4–3.1)

## 2022-04-04 LAB — ABO/RH: ABO/RH(D): O POS

## 2022-04-04 LAB — POC OCCULT BLOOD, ED: Fecal Occult Bld: NEGATIVE

## 2022-04-04 LAB — PROTIME-INR
INR: 1.1 (ref 0.8–1.2)
Prothrombin Time: 14.4 seconds (ref 11.4–15.2)

## 2022-04-04 LAB — MAGNESIUM: Magnesium: 1.8 mg/dL (ref 1.7–2.4)

## 2022-04-04 LAB — PREPARE RBC (CROSSMATCH)

## 2022-04-04 LAB — FERRITIN: Ferritin: 4 ng/mL — ABNORMAL LOW (ref 24–336)

## 2022-04-04 LAB — VITAMIN B12: Vitamin B-12: 306 pg/mL (ref 180–914)

## 2022-04-04 LAB — FOLATE: Folate: 12.6 ng/mL (ref >5.9)

## 2022-04-04 MED ORDER — ACETAMINOPHEN 650 MG RE SUPP: 650.0000 mg | Freq: Four times a day (QID) | RECTAL | Status: DC | PRN

## 2022-04-04 MED ORDER — ONDANSETRON HCL 4 MG/2ML IJ SOLN: 4.0000 mg | Freq: Four times a day (QID) | INTRAMUSCULAR | Status: DC | PRN

## 2022-04-04 MED ORDER — POTASSIUM CHLORIDE 10 MEQ/100ML IV SOLN
10.0000 meq | Freq: Once | INTRAVENOUS | Status: AC
  Administered 2022-04-04: 10 meq via INTRAVENOUS
  Filled 2022-04-04: qty 100

## 2022-04-04 MED ORDER — ACETAMINOPHEN 325 MG PO TABS
650.0000 mg | ORAL_TABLET | Freq: Four times a day (QID) | ORAL | Status: DC | PRN
  Administered 2022-04-05: 650 mg via ORAL
  Filled 2022-04-04: qty 2

## 2022-04-04 MED ORDER — POTASSIUM CHLORIDE 10 MEQ/100ML IV SOLN
10.0000 meq | INTRAVENOUS | Status: AC
  Administered 2022-04-04 – 2022-04-05 (×2): 10 meq via INTRAVENOUS
  Filled 2022-04-04 (×2): qty 100

## 2022-04-04 MED ORDER — POTASSIUM CHLORIDE CRYS ER 20 MEQ PO TBCR
40.0000 meq | EXTENDED_RELEASE_TABLET | Freq: Once | ORAL | Status: DC
  Filled 2022-04-04: qty 2

## 2022-04-04 MED ORDER — NICOTINE 14 MG/24HR TD PT24
14.0000 mg | MEDICATED_PATCH | Freq: Every day | TRANSDERMAL | Status: DC
  Administered 2022-04-04: 14 mg via TRANSDERMAL
  Filled 2022-04-04 (×2): qty 1

## 2022-04-04 MED ORDER — PANTOPRAZOLE 80MG IVPB - SIMPLE MED
80.0000 mg | Freq: Once | INTRAVENOUS | Status: AC
  Administered 2022-04-04: 80 mg via INTRAVENOUS
  Filled 2022-04-04: qty 80

## 2022-04-04 MED ORDER — PANTOPRAZOLE SODIUM 40 MG IV SOLR
40.0000 mg | Freq: Two times a day (BID) | INTRAVENOUS | Status: DC
  Administered 2022-04-04: 40 mg via INTRAVENOUS
  Filled 2022-04-04 (×2): qty 10

## 2022-04-04 MED ORDER — ONDANSETRON HCL 4 MG PO TABS: 4.0000 mg | ORAL_TABLET | Freq: Four times a day (QID) | ORAL | Status: DC | PRN

## 2022-04-04 MED ORDER — MOMETASONE FURO-FORMOTEROL FUM 200-5 MCG/ACT IN AERO
2.0000 | INHALATION_SPRAY | Freq: Two times a day (BID) | RESPIRATORY_TRACT | Status: DC
  Filled 2022-04-04: qty 8.8

## 2022-04-04 MED ORDER — SODIUM CHLORIDE 0.9 % IV SOLN
10.0000 mL/h | Freq: Once | INTRAVENOUS | Status: AC
  Administered 2022-04-04: 10 mL/h via INTRAVENOUS

## 2022-04-04 MED ORDER — POTASSIUM CHLORIDE CRYS ER 20 MEQ PO TBCR: 40.0000 meq | EXTENDED_RELEASE_TABLET | Freq: Once | ORAL | Status: DC

## 2022-04-04 MED ORDER — SODIUM CHLORIDE 0.9 % IV SOLN
250.0000 mg | Freq: Every day | INTRAVENOUS | Status: DC
  Filled 2022-04-04 (×2): qty 20

## 2022-04-04 NOTE — ED Triage Notes (Signed)
Patient reports that he was called last night and Hgb was 5.6. Patient denies any visible blood in his stool and states his stools look normal. Patient does c/o fatigue.

## 2022-04-04 NOTE — ED Provider Triage Note (Signed)
Emergency Medicine Provider Triage Evaluation Note  Brett Vasquez , a 76 y.o. male  was evaluated in triage.  Pt complains of hgb 5.5. Saw PCP yesterday who checked labs and called him late last night to tell him he needed to go to the ER for a transfusion. Reports normal stools, not on blood thinner. Feels fine.  Review of Systems  Positive: No compliant Negative: Fatigue, SHOB, palpitations   Physical Exam  There were no vitals taken for this visit. Gen:   Awake, no distress   Resp:  Normal effort  MSK:   Moves extremities without difficulty  Other:  pale  Medical Decision Making  Medically screening exam initiated at 1:58 PM.  Appropriate orders placed.  [name] was informed that the remainder of the evaluation will be completed by another provider, this initial triage assessment does not replace that evaluation, and the importance of remaining in the ED until their evaluation is complete.     Tacy Learn, PA-C 04/04/22 1359

## 2022-04-04 NOTE — H&P (Signed)
[facility] History and Physical  Brett Vasquez:[id number] DOB: 10/18/1945 DOA: 04/04/2022   PCP: Susy Frizzle, MD  Specialists: None  Chief Complaint: Fatigue and shortness of breath with exertion  HPI: Brett Vasquez is a 76 y.o. male with a past medical history of COPD, current smoker, essential hypertension who was in his usual state of health until 1 to 2 weeks ago when he started noticing more fatigue than usual.  He also noticed that he was getting short of breath with exertion.  Patient is a very poor historian.  His son is at the bedside but history remains limited.  Patient denies any chest pain currently.  No shortness of breath at rest.  Denies any abdominal pain nausea or vomiting.  There was some mention of perhaps dark appearing stools over the last week or so but patient denies any current episode of dark stool.  No blood in the stool.  He mentioned that he did have some weight loss last year but none in the last few months.  No fever or chills.  He went to his primary care provider's office and had blood work done which revealed severe anemia.  He was asked to come to the emergency department.  In the emergency department his hemoglobin was low at 5.5.  Anemia panel was done which showed severe iron deficiency.  Stool for occult blood was negative.  Chest x-ray did not show any acute findings.  Blood transfusion was ordered and the patient will be hospitalized for further management.  Home Medications: This list has not been reconciled yet. Prior to Admission medications   Medication Sig Start Date End Date Taking? Authorizing Provider  albuterol (VENTOLIN HFA) 108 (90 Base) MCG/ACT inhaler Inhale 2 puffs into the lungs every 6 (six) hours as needed for wheezing or shortness of breath. 10/28/21  Yes [provider]  amLODipine (NORVASC) 5 MG tablet Take 5 mg by mouth daily. 01/18/22  Yes [provider]  lisinopril-hydrochlorothiazide (ZESTORETIC)  10-12.5 MG tablet Take 1 tablet by mouth daily. 12/28/21  Yes [provider]  amLODipine (NORVASC) 10 MG tablet Take 1 tablet (10 mg total) by mouth daily. 10/09/21   Susy Frizzle, MD  BREZTRI AEROSPHERE 160-9-4.8 MCG/ACT AERO Inhale into the lungs. 04/03/22   [provider]  budesonide-formoterol (SYMBICORT) 160-4.5 MCG/ACT inhaler INHALE 2 PUFFS INTO LUNGS TWICE A DAY 06/10/21   Susy Frizzle, MD  hydrochlorothiazide (HYDRODIURIL) 25 MG tablet Take 1 tablet (25 mg total) by mouth daily. 10/05/20   Susy Frizzle, MD  losartan (COZAAR) 50 MG tablet TAKE 1 TABLET (50 MG TOTAL) BY MOUTH DAILY AT 10 PM. 02/21/22 05/22/22  Tolia, Sunit, DO  meloxicam (MOBIC) 7.5 MG tablet Take 7.5 mg by mouth daily. 04/03/22   [provider]  potassium chloride (KLOR-CON) 10 MEQ tablet Take 1 tablet (10 mEq total) by mouth daily. 10/09/20   Susy Frizzle, MD    Allergies: No Known Allergies  Past Medical History: Past Medical History:  Diagnosis Date   COPD (chronic obstructive pulmonary disease) ([place])    High cholesterol    Hypertension    Smoker unmotivated to quit     History reviewed. No pertinent surgical history.  Social History: Smokes half a pack of cigarettes on a daily basis.  Denies any alcohol use.  Usually independent with daily activities.  Family History:  Family History  Problem Relation Age of Onset   Cancer Sister   Unknown cancer  and family.  Review of Systems - History obtained from the patient General ROS: positive for  - fatigue Psychological ROS: negative Ophthalmic ROS: negative ENT ROS: negative Allergy and Immunology ROS: negative Hematological and Lymphatic ROS: negative Endocrine ROS: negative Respiratory ROS: As in HPI Cardiovascular ROS: As in HPI Gastrointestinal ROS: As in HPI Genito-Urinary ROS: no dysuria, trouble voiding, or hematuria Musculoskeletal ROS: negative Neurological ROS: no TIA or stroke symptoms Dermatological  ROS: negative  Physical Examination  Vitals:   04/04/22 1645 04/04/22 1650 04/04/22 1700 04/04/22 1715  BP:   (!) 128/52   Pulse: 69 84 77 79  Resp: (!) 22 20 (!) 21 20  Temp:      TempSrc:      SpO2: 99% 97% 100% 95%  Weight:      Height:        BP (!) 128/52   Pulse 79   Temp 98.2 F (36.8 C) (Oral)   Resp 20   Ht '5\' 9"'$  (1.753 m)   Wt 65.8 kg   SpO2 95%   BMI 21.41 kg/m   General appearance: alert, cooperative, appears stated age, and no distress Head: Normocephalic, without obvious abnormality, atraumatic Eyes: conjunctivae/corneas clear. PERRL, EOM's intact.  Throat: lips, mucosa, and tongue normal; teeth and gums normal Neck: no adenopathy, no carotid bruit, no JVD, supple, symmetrical, trachea midline, and thyroid not enlarged, symmetric, no tenderness/mass/nodules Resp: clear to auscultation bilaterally Cardio: regular rate and rhythm, S1, S2 normal, no murmur, click, rub or gallop GI: soft, non-tender; bowel sounds normal; no masses,  no organomegaly Extremities: Mild pedal edema is noted bilateral lower extremities.  No calf tenderness. Pulses: 2+ and symmetric Skin: Skin color, texture, turgor normal. No rashes or lesions Lymph nodes: Cervical, supraclavicular, and axillary nodes normal. Neurologic: Alert and oriented x3.  Cranial nerves II to XII intact.  Motor strength equal bilateral upper and lower extremities.   Labs on Admission: I have personally reviewed following labs and imaging studies  CBC: Recent Labs  Lab 04/04/22 1607  WBC 7.2  NEUTROABS 4.7  HGB 5.5*  HCT 22.3*  MCV 60.3*  PLT 423   Basic Metabolic Panel: Recent Labs  Lab 04/04/22 1607  NA 145  K 2.6*  CL 110  CO2 27  GLUCOSE 89  BUN 18  CREATININE 0.77  CALCIUM 9.0   GFR: Estimated Creatinine Clearance: 70.8 mL/min (by C-G formula based on SCr of 0.77 mg/dL). Liver Function Tests: Recent Labs  Lab 04/04/22 1607  AST 16  ALT 10  ALKPHOS 74  BILITOT 1.0  PROT 6.7   ALBUMIN 3.6    Anemia Panel: Recent Labs    04/04/22 1607  VITAMINB12 306  FOLATE 12.6  FERRITIN 4*  TIBC 455*  IRON 13*  RETICCTPCT 1.4     Radiological Exams on Admission: DG Chest Port 1 View  Result Date: 04/04/2022 CLINICAL DATA:  Weakness EXAM: PORTABLE CHEST 1 VIEW COMPARISON:  Radiographs 11/09/2017; CT chest 12/06/2010 FINDINGS: Cardiomegaly. Left basilar atelectasis or scarring. The lungs are otherwise clear. Hyperinflation. No pleural effusion or pneumothorax. Prominent ascending aorta. No acute osseous abnormality. IMPRESSION: 1. Hyperinflation consistent with COPD. 2. Cardiomegaly. Electronically Signed   By: Placido Sou M.D.   On: 04/04/2022 15:44    My interpretation of Electrocardiogram: Sinus rhythm in the 70s.   Normal axis.  Intervals appear to be normal.  Nonspecific ST segment changes.  Overall poor quality EKG.   Problem List  Principal Problem:   Symptomatic anemia  Active Problems:   Smoker unmotivated to quit   Essential hypertension   Iron deficiency anemia   COPD (chronic obstructive pulmonary disease) (HCC)   Assessment: This is a 76 year old African-American male with past medical history as stated earlier who comes in with 1 to 2-week history of fatigue and dyspnea on exertion.  Found to have a hemoglobin of 5.5 which explains his symptoms.  Reason for anemia is not entirely clear.  He is noted to be profoundly iron deficiency.  Slow GI blood loss is thought to be #1 on the differential.  His LFTs are normal.  Plan:  #1. Symptomatic anemia/iron deficiency anemia: He has been ordered 2 units of PRBC which will be administered.  Iron infusions will be ordered starting tomorrow.  Stool for occult blood was negative.  Patient has never had a GI evaluation previously.  Denies any recent weight loss.  We will request gastroenterology to evaluate.  Discussed with Dr. Alessandra Bevels with [facility] gastroenterology.  Place him on PPI for now.  #2. Severe  hypokalemia: He is noted to be on diuretics at home which could be the reason for his low potassium level.  This will be aggressively repleted.  Check magnesium level.  Recheck labs tomorrow.  #3.  History of COPD/current smoker: He was counseled.  Continue with inhalers.  Nicotine patch.  #4.  Essential hypertension: Blood pressure is noted to be reasonably well controlled currently.  Continue to monitor for now.  Hold his antihypertensives.  Home medication list to be reconciled as well.    DVT Prophylaxis: SCDs Code Status: Full code Family Communication: Discussed with patient and his son Disposition: Hopefully return home when improved Consults called: [facility] Admission Status: Status is: Observation The patient remains OBS appropriate and will d/c before 2 midnights.    Severity of Illness: The appropriate patient status for this patient is OBSERVATION. Observation status is judged to be reasonable and necessary in order to provide the required intensity of service to ensure the patient's safety. The patient's presenting symptoms, physical exam findings, and initial radiographic and laboratory data in the context of their medical condition is felt to place them at decreased risk for further clinical deterioration. Furthermore, it is anticipated that the patient will be medically stable for discharge from the hospital within 2 midnights of admission.    Further management decisions will depend on results of further testing and patient's response to treatment.   Shaneen Reeser [employer]  [facility] operational officer on [email or web address]  04/04/2022, 6:05 PM

## 2022-04-04 NOTE — ED Provider Notes (Signed)
[place] DEPT Provider Note   CSN: [id number] Arrival date & time: 04/04/22  1351     History  Chief Complaint  Patient presents with   Abnormal Lab   Fatigue    Brett Vasquez is a 76 y.o. male.  76 year old male with prior medical history as detailed below presents for evaluation.  Patient reports that he was seen by his PCP yesterday.  Patient was called late last night with results of his testing in the office.  Patient was told his hemoglobin was 5.6 and that he should come to the ED for evaluation.  Patient reports increased weakness and fatigue over the last 1 to 2 weeks.  He denies chest pain or shortness of breath.  He denies prior history of significant anemia.  He denies prior history of GI bleed.  He does report having seen some darker stool last week.  He denies any dark stool in the last several days.  He denies hematemesis.  He denies fever.  He denies prior GI work-up including colonoscopy.  The history is provided by the patient and medical records.       Home Medications Prior to Admission medications   Medication Sig Start Date End Date Taking? Authorizing Provider  albuterol (VENTOLIN HFA) 108 (90 Base) MCG/ACT inhaler Inhale 2 puffs into the lungs every 6 (six) hours as needed for wheezing or shortness of breath. 10/28/21  Yes [provider]  amLODipine (NORVASC) 5 MG tablet Take 5 mg by mouth daily. 01/18/22  Yes [provider]  lisinopril-hydrochlorothiazide (ZESTORETIC) 10-12.5 MG tablet Take 1 tablet by mouth daily. 12/28/21  Yes [provider]  amLODipine (NORVASC) 10 MG tablet Take 1 tablet (10 mg total) by mouth daily. 10/09/21   Susy Frizzle, MD  BREZTRI AEROSPHERE 160-9-4.8 MCG/ACT AERO Inhale into the lungs. 04/03/22   [provider]  budesonide-formoterol (SYMBICORT) 160-4.5 MCG/ACT inhaler INHALE 2 PUFFS INTO LUNGS TWICE A DAY 06/10/21   Susy Frizzle, MD   hydrochlorothiazide (HYDRODIURIL) 25 MG tablet Take 1 tablet (25 mg total) by mouth daily. 10/05/20   Susy Frizzle, MD  losartan (COZAAR) 50 MG tablet TAKE 1 TABLET (50 MG TOTAL) BY MOUTH DAILY AT 10 PM. 02/21/22 05/22/22  Tolia, Sunit, DO  meloxicam (MOBIC) 7.5 MG tablet Take 7.5 mg by mouth daily. 04/03/22   [provider]  potassium chloride (KLOR-CON) 10 MEQ tablet Take 1 tablet (10 mEq total) by mouth daily. 10/09/20   Susy Frizzle, MD      Allergies    Patient has no known allergies.    Review of Systems   Review of Systems  All other systems reviewed and are negative.   Physical Exam Updated Vital Signs BP (!) 155/77   Pulse 83   Temp 98.2 F (36.8 C) (Oral)   Resp 16   Ht '5\' 9"'$  (1.753 m)   Wt 65.8 kg   SpO2 100%   BMI 21.41 kg/m  Physical Exam Vitals and nursing note reviewed.  Constitutional:      General: He is not in acute distress.    Appearance: He is well-developed.     Comments: Alert, pale  HENT:     Head: Normocephalic and atraumatic.  Eyes:     Pupils: Pupils are equal, round, and reactive to light.     Comments: Pale conjunctiva  Cardiovascular:     Rate and Rhythm: Normal rate and regular rhythm.     Heart sounds:  Normal heart sounds.  Pulmonary:     Effort: Pulmonary effort is normal. No respiratory distress.     Breath sounds: Normal breath sounds.  Abdominal:     General: There is no distension.     Palpations: Abdomen is soft.     Tenderness: There is no abdominal tenderness.  Musculoskeletal:        General: No deformity. Normal range of motion.     Cervical back: Normal range of motion and neck supple.  Skin:    General: Skin is warm and dry.  Neurological:     General: No focal deficit present.     Mental Status: He is alert and oriented to person, place, and time.     ED Results / Procedures / Treatments   Labs (all labs ordered are listed, but only abnormal results are displayed) Labs Reviewed  RETICULOCYTES -  Abnormal; Notable for the following components:      Result Value   RBC. 3.65 (*)    Immature Retic Fract 25.0 (*)    All other components within normal limits  VITAMIN B12  FOLATE  IRON AND TIBC  FERRITIN  COMPREHENSIVE METABOLIC PANEL  CBC WITH DIFFERENTIAL/PLATELET  POC OCCULT BLOOD, ED  TYPE AND SCREEN    EKG None  Radiology DG Chest Port 1 View  Result Date: 04/04/2022 CLINICAL DATA:  Weakness EXAM: PORTABLE CHEST 1 VIEW COMPARISON:  Radiographs 11/09/2017; CT chest 12/06/2010 FINDINGS: Cardiomegaly. Left basilar atelectasis or scarring. The lungs are otherwise clear. Hyperinflation. No pleural effusion or pneumothorax. Prominent ascending aorta. No acute osseous abnormality. IMPRESSION: 1. Hyperinflation consistent with COPD. 2. Cardiomegaly. Electronically Signed   By: Placido Sou M.D.   On: 04/04/2022 15:44    Procedures Procedures    Medications Ordered in ED Medications - No data to display  ED Course/ Medical Decision Making/ A&P                           Medical Decision Making Amount and/or Complexity of Data Reviewed Labs: ordered. Radiology: ordered.  Risk Prescription drug management. Decision regarding hospitalization.    Medical Screen Complete  This patient presented to the ED with complaint of weakness, anemia.  This complaint involves an extensive number of treatment options. The initial differential diagnosis includes, but is not limited to, metabolic abnormality, symptomatic anemia, GI bleed, iron deficiency, etc.  This presentation is: Acute, Self-Limited, Previously Undiagnosed, Uncertain Prognosis, Complicated, Systemic Symptoms, and Threat to Life/Bodily Function  Patient is presenting with complaint of weakness.  Patient seen by PCP yesterday and diagnosed with likely anemia.  Patient denies active GI bleeding.  Patient does report darker colored stool last week.  He is guaiac negative.  Hemoglobin today is  5.5  Additionally potassium is 2.6.  Patient will require transfusion and admission for further work-up and treatment.  Hospitalist service is aware case and will evaluate for same.   Co morbidities that complicated the patient's evaluation  Hypertension, COPD   Additional history obtained:  Additional history obtained from [facility] External records from outside sources obtained and reviewed including prior ED visits and prior Inpatient records.    Lab Tests:  I ordered and personally interpreted labs.  The pertinent results include: CBC, CMP, stool guaiac   Imaging Studies ordered:  I ordered imaging studies including chest x-ray I independently visualized and interpreted obtained imaging which showed NAD I agree with the radiologist interpretation.   Cardiac Monitoring:  The patient  was maintained on a cardiac monitor.  I personally viewed and interpreted the cardiac monitor which showed an underlying rhythm of: NSR   Medicines ordered:  I ordered medication including potassium, blood transfusion for anemia, hypokalemia Reevaluation of the patient after these medicines showed that the patient: improved    Problem List / ED Course:  Anemia symptomatic, hypokalemia, iron deficiency   Reevaluation:  After the interventions noted above, I reevaluated the patient and found that they have: improved  Disposition:  After consideration of the diagnostic results and the patients response to treatment, I feel that the patent would benefit from admission.          Final Clinical Impression(s) / ED Diagnoses Final diagnoses:  Symptomatic anemia  Hypokalemia    Rx / DC Orders ED Discharge Orders     None         Valarie Merino, MD 04/04/22 1746

## 2022-04-04 NOTE — Progress Notes (Signed)
Notified  by the nurse that patient wants to call a cab go home, I have talked to him by phone, explained for him seriousness of his condition, and his anemia with hemoglobin of 5.5, report he is bothered by the wires, I have told him I can discontinue his telemetry if this is what it takes to make him stay for further work-up, he is agreeable. Phillips Climes MD

## 2022-04-05 ENCOUNTER — Encounter (HOSPITAL_COMMUNITY): Payer: Self-pay | Admitting: Internal Medicine

## 2022-04-05 DIAGNOSIS — D509 Iron deficiency anemia, unspecified: Secondary | ICD-10-CM | POA: Diagnosis not present

## 2022-04-05 LAB — COMPREHENSIVE METABOLIC PANEL
ALT: 10 U/L (ref 0–44)
AST: 15 U/L (ref 15–41)
Albumin: 3.3 g/dL — ABNORMAL LOW (ref 3.5–5.0)
Alkaline Phosphatase: 72 U/L (ref 38–126)
Anion gap: 7 (ref 5–15)
BUN: 12 mg/dL (ref 8–23)
CO2: 26 mmol/L (ref 22–32)
Calcium: 8.6 mg/dL — ABNORMAL LOW (ref 8.9–10.3)
Chloride: 107 mmol/L (ref 98–111)
Creatinine, Ser: 0.79 mg/dL (ref 0.61–1.24)
GFR, Estimated: >60 mL/min (ref >60)
Glucose, Bld: 78 mg/dL (ref 70–99)
Potassium: 2.8 mmol/L — ABNORMAL LOW (ref 3.5–5.1)
Sodium: 140 mmol/L (ref 135–145)
Total Bilirubin: 1.8 mg/dL — ABNORMAL HIGH (ref 0.3–1.2)
Total Protein: 6 g/dL — ABNORMAL LOW (ref 6.5–8.1)

## 2022-04-05 LAB — CBC
HCT: 27.7 % — ABNORMAL LOW (ref 39.0–52.0)
Hemoglobin: 7.6 g/dL — ABNORMAL LOW (ref 13.0–17.0)
MCH: 18.3 pg — ABNORMAL LOW (ref 26.0–34.0)
MCHC: 27.4 g/dL — ABNORMAL LOW (ref 30.0–36.0)
MCV: 66.7 fL — ABNORMAL LOW (ref 80.0–100.0)
Platelets: 298 10*3/uL (ref 150–400)
RBC: 4.15 MIL/uL — ABNORMAL LOW (ref 4.22–5.81)
RDW: 28 % — ABNORMAL HIGH (ref 11.5–15.5)
WBC: 5.5 10*3/uL (ref 4.0–10.5)
nRBC: 0 % (ref 0.0–0.2)

## 2022-04-05 LAB — MAGNESIUM: Magnesium: 1.5 mg/dL — ABNORMAL LOW (ref 1.7–2.4)

## 2022-04-05 MED ORDER — POTASSIUM CHLORIDE 10 MEQ/100ML IV SOLN
10.0000 meq | Freq: Once | INTRAVENOUS | Status: AC
  Administered 2022-04-05: 10 meq via INTRAVENOUS
  Filled 2022-04-05: qty 100

## 2022-04-05 MED ORDER — POTASSIUM CHLORIDE 10 MEQ/100ML IV SOLN: 10.0000 meq | INTRAVENOUS | Status: DC

## 2022-04-05 MED ORDER — MAGNESIUM SULFATE 2 GM/50ML IV SOLN: 2.0000 g | Freq: Once | INTRAVENOUS | Status: DC

## 2022-04-05 MED ORDER — POTASSIUM CHLORIDE CRYS ER 20 MEQ PO TBCR: 40.0000 meq | EXTENDED_RELEASE_TABLET | ORAL | Status: DC

## 2022-04-05 NOTE — Progress Notes (Signed)
Patient refusing bed alarm. RN explained importance of bed alarm and risk for falling while in the hospital.

## 2022-04-05 NOTE — Progress Notes (Signed)
Date: 04/05/2022 Patient: Brett Vasquez Admitted: 04/04/2022  1:56 PM Attending Provider: Bonnielee Haff, MD  [name] or his authorized caregiver has made the decision for the patient to leave the emergency department against the advice of Bonnielee Haff, MD.  He or his authorized caregiver has been informed and understands the inherent risks, including death.  He or his authorized caregiver has decided to accept the responsibility for this decision. [name] and all necessary parties have been advised that he may return for further evaluation or treatment. His condition at time of discharge was Critical.  [name] had current vital signs as follows:  Blood pressure (!) 152/79, pulse 70, temperature (!) 97.3 F (36.3 C), temperature source Oral, resp. rate 18, height '5\' 9"'$  (1.753 m), weight 65.8 kg, SpO2 100 %.   [name] or his authorized caregiver has signed the Leaving Against Medical Advice form prior to leaving the department.  Leland Johns 04/05/2022

## 2022-04-05 NOTE — Progress Notes (Signed)
Patient refused assessment

## 2022-04-05 NOTE — Discharge Summary (Signed)
[facility]  Physician Discharge Summary   Patient ID: Brett Vasquez MRN: [id number] DOB/AGE: 1946-02-24 76 y.o.  Admit date: 04/04/2022 Discharge date: 04/05/2022    PCP: Susy Frizzle, MD  DISCHARGE DIAGNOSES:  Principal Problem:   Symptomatic anemia Active Problems:   Smoker unmotivated to quit   Essential hypertension   Iron deficiency anemia   COPD (chronic obstructive pulmonary disease) ([facility])   PATIENT LEFT AGAINST MEDICAL ADVICE    INITIAL HISTORY: 76 y.o. male with a past medical history of COPD, current smoker, essential hypertension who was in his usual state of health until 1 to 2 weeks ago when he started noticing more fatigue than usual.  He also noticed that he was getting short of breath with exertion.  Patient is a very poor historian.  His son is at the bedside but history remains limited.  Patient denies any chest pain currently.  No shortness of breath at rest.  Denies any abdominal pain nausea or vomiting.  There was some mention of perhaps dark appearing stools over the last week or so but patient denies any current episode of dark stool.  No blood in the stool.  He mentioned that he did have some weight loss last year but none in the last few months.  No fever or chills.   He went to his primary care provider's office and had blood work done which revealed severe anemia.  He was asked to come to the emergency department.  In the emergency department his hemoglobin was low at 5.5.  Anemia panel was done which showed severe iron deficiency.  Stool for occult blood was negative.  Chest x-ray did not show any acute findings.  Blood transfusion was ordered and the patient will be hospitalized for further management.  Consultations: [facility]  Procedures: None  HOSPITAL COURSE:   #1. Symptomatic anemia/iron deficiency anemia: He was transfused 2 units of PRBC.  Hemoglobin improved to 7.6 as of this morning.   Iron infusions were also ordered.  Gastroenterology was consulted.  However before any of this could happen patient decided to leave AMA.  We tried to convince him to stay but he refused.     #2. Hypokalemia/hypomagnesemia: Likely secondary to diuretics that he takes at home along with GI loss.  Aggressive replacement was ordered.  More replacement was ordered today along with replacement of magnesium.  However patient decided to leave [place] before he could be given more supplements.    #3.  History of COPD/current smoker  #4.  Essential hypertension  PATIENT LEFT AGAINST MEDICAL ADVICE  PERTINENT LABS:  The results of significant diagnostics from this hospitalization (including imaging, microbiology, ancillary and laboratory) are listed below for reference.     Labs:   Basic Metabolic Panel: Recent Labs  Lab 04/04/22 1606 04/04/22 1607 04/05/22 0638  NA  --  145 140  K  --  2.6* 2.8*  CL  --  110 107  CO2  --  27 26  GLUCOSE  --  89 78  BUN  --  18 12  CREATININE  --  0.77 0.79  CALCIUM  --  9.0 8.6*  MG 1.8  --  1.5*   Liver Function Tests: Recent Labs  Lab 04/04/22 1607 04/05/22 0638  AST 16 15  ALT 10 10  ALKPHOS 74 72  BILITOT 1.0 1.8*  PROT 6.7 6.0*  ALBUMIN 3.6 3.3*    CBC: Recent Labs  Lab 04/04/22 1607 04/05/22 9563  WBC 7.2 5.5  NEUTROABS 4.7  --   HGB 5.5* 7.6*  HCT 22.3* 27.7*  MCV 60.3* 66.7*  PLT 366 298      IMAGING STUDIES DG Chest Port 1 View  Result Date: 04/04/2022 CLINICAL DATA:  Weakness EXAM: PORTABLE CHEST 1 VIEW COMPARISON:  Radiographs 11/09/2017; CT chest 12/06/2010 FINDINGS: Cardiomegaly. Left basilar atelectasis or scarring. The lungs are otherwise clear. Hyperinflation. No pleural effusion or pneumothorax. Prominent ascending aorta. No acute osseous abnormality. IMPRESSION: 1. Hyperinflation consistent with COPD. 2. Cardiomegaly. Electronically Signed   By: Placido Sou M.D.   On: 04/04/2022 15:44    PATIENT LEFT AGAINST MEDICAL  ADVICE  Brett Vasquez  [facility] Pager on [email or web address]  04/05/2022, 1:56 PM

## 2022-04-06 LAB — TYPE AND SCREEN
ABO/RH(D): O POS
Antibody Screen: NEGATIVE
Unit division: 0
Unit division: 0

## 2022-04-06 LAB — BPAM RBC
Blood Product Expiration Date: 202308192359
Blood Product Expiration Date: 202308212359
ISSUE DATE / TIME: 202307220008
ISSUE DATE / TIME: 202307220325
Unit Type and Rh: 5100
Unit Type and Rh: 5100

## 2022-04-15 DIAGNOSIS — I10 Essential (primary) hypertension: Secondary | ICD-10-CM | POA: Diagnosis not present

## 2022-04-15 DIAGNOSIS — E876 Hypokalemia: Secondary | ICD-10-CM | POA: Diagnosis not present

## 2022-04-15 DIAGNOSIS — D649 Anemia, unspecified: Secondary | ICD-10-CM | POA: Diagnosis not present

## 2022-04-15 DIAGNOSIS — Z720 Tobacco use: Secondary | ICD-10-CM | POA: Diagnosis not present

## 2022-04-15 DIAGNOSIS — F17210 Nicotine dependence, cigarettes, uncomplicated: Secondary | ICD-10-CM | POA: Diagnosis not present

## 2022-05-27 DIAGNOSIS — R011 Cardiac murmur, unspecified: Secondary | ICD-10-CM | POA: Diagnosis not present

## 2022-05-27 DIAGNOSIS — I10 Essential (primary) hypertension: Secondary | ICD-10-CM | POA: Diagnosis not present

## 2022-05-27 DIAGNOSIS — D649 Anemia, unspecified: Secondary | ICD-10-CM | POA: Diagnosis not present

## 2022-05-29 ENCOUNTER — Other Ambulatory Visit: Payer: Self-pay

## 2022-05-29 ENCOUNTER — Emergency Department (HOSPITAL_COMMUNITY): Payer: Medicare Other

## 2022-05-29 ENCOUNTER — Inpatient Hospital Stay (HOSPITAL_COMMUNITY)
Admission: EM | Admit: 2022-05-29 | Discharge: 2022-06-01 | DRG: 378 | Disposition: A | Discharge status: Home / Self Care | Payer: Medicare Other | Attending: Student | Admitting: Student

## 2022-05-29 ENCOUNTER — Encounter (HOSPITAL_COMMUNITY): Payer: Self-pay | Admitting: Emergency Medicine

## 2022-05-29 DIAGNOSIS — I10 Essential (primary) hypertension: Secondary | ICD-10-CM | POA: Diagnosis not present

## 2022-05-29 DIAGNOSIS — R0602 Shortness of breath: Secondary | ICD-10-CM

## 2022-05-29 DIAGNOSIS — K648 Other hemorrhoids: Secondary | ICD-10-CM | POA: Diagnosis present

## 2022-05-29 DIAGNOSIS — D649 Anemia, unspecified: Secondary | ICD-10-CM | POA: Diagnosis present

## 2022-05-29 DIAGNOSIS — F05 Delirium due to known physiological condition: Secondary | ICD-10-CM | POA: Diagnosis not present

## 2022-05-29 DIAGNOSIS — K2971 Gastritis, unspecified, with bleeding: Secondary | ICD-10-CM | POA: Diagnosis not present

## 2022-05-29 DIAGNOSIS — J449 Chronic obstructive pulmonary disease, unspecified: Secondary | ICD-10-CM | POA: Diagnosis not present

## 2022-05-29 DIAGNOSIS — D509 Iron deficiency anemia, unspecified: Secondary | ICD-10-CM | POA: Diagnosis not present

## 2022-05-29 DIAGNOSIS — K449 Diaphragmatic hernia without obstruction or gangrene: Secondary | ICD-10-CM | POA: Diagnosis present

## 2022-05-29 DIAGNOSIS — K922 Gastrointestinal hemorrhage, unspecified: Secondary | ICD-10-CM | POA: Diagnosis present

## 2022-05-29 DIAGNOSIS — Z791 Long term (current) use of non-steroidal anti-inflammatories (NSAID): Secondary | ICD-10-CM | POA: Diagnosis not present

## 2022-05-29 DIAGNOSIS — K2970 Gastritis, unspecified, without bleeding: Secondary | ICD-10-CM | POA: Diagnosis not present

## 2022-05-29 DIAGNOSIS — K5730 Diverticulosis of large intestine without perforation or abscess without bleeding: Secondary | ICD-10-CM | POA: Diagnosis not present

## 2022-05-29 DIAGNOSIS — R410 Disorientation, unspecified: Secondary | ICD-10-CM

## 2022-05-29 DIAGNOSIS — K635 Polyp of colon: Secondary | ICD-10-CM | POA: Diagnosis present

## 2022-05-29 DIAGNOSIS — R5383 Other fatigue: Secondary | ICD-10-CM

## 2022-05-29 DIAGNOSIS — F17200 Nicotine dependence, unspecified, uncomplicated: Secondary | ICD-10-CM

## 2022-05-29 DIAGNOSIS — E7800 Pure hypercholesterolemia, unspecified: Secondary | ICD-10-CM | POA: Diagnosis present

## 2022-05-29 DIAGNOSIS — Z79899 Other long term (current) drug therapy: Secondary | ICD-10-CM | POA: Diagnosis not present

## 2022-05-29 DIAGNOSIS — Z7951 Long term (current) use of inhaled steroids: Secondary | ICD-10-CM | POA: Diagnosis not present

## 2022-05-29 DIAGNOSIS — E876 Hypokalemia: Secondary | ICD-10-CM | POA: Diagnosis not present

## 2022-05-29 DIAGNOSIS — D500 Iron deficiency anemia secondary to blood loss (chronic): Secondary | ICD-10-CM | POA: Diagnosis not present

## 2022-05-29 DIAGNOSIS — D123 Benign neoplasm of transverse colon: Secondary | ICD-10-CM | POA: Diagnosis not present

## 2022-05-29 DIAGNOSIS — F17210 Nicotine dependence, cigarettes, uncomplicated: Secondary | ICD-10-CM | POA: Diagnosis not present

## 2022-05-29 HISTORY — DX: Anemia, unspecified: D64.9

## 2022-05-29 LAB — BASIC METABOLIC PANEL
Anion gap: 6 (ref 5–15)
BUN: 9 mg/dL (ref 8–23)
CO2: 25 mmol/L (ref 22–32)
Calcium: 8.5 mg/dL — ABNORMAL LOW (ref 8.9–10.3)
Chloride: 110 mmol/L (ref 98–111)
Creatinine, Ser: 1.09 mg/dL (ref 0.61–1.24)
GFR, Estimated: >60 mL/min (ref >60)
Glucose, Bld: 136 mg/dL — ABNORMAL HIGH (ref 70–99)
Potassium: 2.6 mmol/L — CL (ref 3.5–5.1)
Sodium: 141 mmol/L (ref 135–145)

## 2022-05-29 LAB — HEMOGLOBIN AND HEMATOCRIT, BLOOD
HCT: 25.1 % — ABNORMAL LOW (ref 39.0–52.0)
Hemoglobin: 7.1 g/dL — ABNORMAL LOW (ref 13.0–17.0)

## 2022-05-29 LAB — CBC WITH DIFFERENTIAL/PLATELET
Abs Immature Granulocytes: 0.03 10*3/uL (ref 0.00–0.07)
Basophils Absolute: 0.1 10*3/uL (ref 0.0–0.1)
Basophils Relative: 1 %
Eosinophils Absolute: 0.2 10*3/uL (ref 0.0–0.5)
Eosinophils Relative: 2 %
HCT: 25.2 % — ABNORMAL LOW (ref 39.0–52.0)
Hemoglobin: 6.9 g/dL — CL (ref 13.0–17.0)
Immature Granulocytes: 0 %
Lymphocytes Relative: 26 %
Lymphs Abs: 2.1 10*3/uL (ref 0.7–4.0)
MCH: 17.5 pg — ABNORMAL LOW (ref 26.0–34.0)
MCHC: 27.4 g/dL — ABNORMAL LOW (ref 30.0–36.0)
MCV: 64 fL — ABNORMAL LOW (ref 80.0–100.0)
Monocytes Absolute: 0.5 10*3/uL (ref 0.1–1.0)
Monocytes Relative: 5 %
Neutro Abs: 5.5 10*3/uL (ref 1.7–7.7)
Neutrophils Relative %: 66 %
Platelets: 338 10*3/uL (ref 150–400)
RBC: 3.94 MIL/uL — ABNORMAL LOW (ref 4.22–5.81)
RDW: 22.3 % — ABNORMAL HIGH (ref 11.5–15.5)
WBC: 8.4 10*3/uL (ref 4.0–10.5)
nRBC: 0 % (ref 0.0–0.2)

## 2022-05-29 LAB — FOLATE: Folate: 14.7 ng/mL (ref >5.9)

## 2022-05-29 LAB — RETICULOCYTES
Immature Retic Fract: 25.6 % — ABNORMAL HIGH (ref 2.3–15.9)
RBC.: 4.02 MIL/uL — ABNORMAL LOW (ref 4.22–5.81)
Retic Count, Absolute: 28.1 10*3/uL (ref 19.0–186.0)
Retic Ct Pct: 0.7 % (ref 0.4–3.1)

## 2022-05-29 LAB — POC OCCULT BLOOD, ED: Fecal Occult Bld: POSITIVE — AB

## 2022-05-29 LAB — MAGNESIUM: Magnesium: 1.6 mg/dL — ABNORMAL LOW (ref 1.7–2.4)

## 2022-05-29 LAB — PREPARE RBC (CROSSMATCH)

## 2022-05-29 LAB — POTASSIUM: Potassium: 3 mmol/L — ABNORMAL LOW (ref 3.5–5.1)

## 2022-05-29 MED ORDER — HYDRALAZINE HCL 20 MG/ML IJ SOLN: 5.0000 mg | Freq: Four times a day (QID) | INTRAMUSCULAR | Status: DC | PRN

## 2022-05-29 MED ORDER — ALBUTEROL SULFATE (2.5 MG/3ML) 0.083% IN NEBU
2.5000 mg | INHALATION_SOLUTION | Freq: Four times a day (QID) | RESPIRATORY_TRACT | Status: DC | PRN
  Administered 2022-05-31: 2.5 mg via RESPIRATORY_TRACT
  Filled 2022-05-29: qty 3

## 2022-05-29 MED ORDER — PEG-KCL-NACL-NASULF-NA ASC-C 100 G PO SOLR
1.0000 | Freq: Once | ORAL | Status: AC
  Administered 2022-05-29: 200 g via ORAL
  Filled 2022-05-29: qty 1

## 2022-05-29 MED ORDER — POTASSIUM CHLORIDE CRYS ER 10 MEQ PO TBCR
10.0000 meq | EXTENDED_RELEASE_TABLET | Freq: Every day | ORAL | Status: DC
  Filled 2022-05-29: qty 1

## 2022-05-29 MED ORDER — PANTOPRAZOLE SODIUM 40 MG IV SOLR
40.0000 mg | Freq: Once | INTRAVENOUS | Status: AC
  Administered 2022-05-29: 40 mg via INTRAVENOUS
  Filled 2022-05-29: qty 10

## 2022-05-29 MED ORDER — POTASSIUM CHLORIDE CRYS ER 20 MEQ PO TBCR: 40.0000 meq | EXTENDED_RELEASE_TABLET | Freq: Once | ORAL | Status: DC

## 2022-05-29 MED ORDER — ALBUTEROL SULFATE HFA 108 (90 BASE) MCG/ACT IN AERS: 2.0000 | INHALATION_SPRAY | Freq: Four times a day (QID) | RESPIRATORY_TRACT | Status: DC | PRN

## 2022-05-29 MED ORDER — MAGNESIUM SULFATE 2 GM/50ML IV SOLN
2.0000 g | Freq: Once | INTRAVENOUS | Status: AC
  Administered 2022-05-29: 2 g via INTRAVENOUS
  Filled 2022-05-29: qty 50

## 2022-05-29 MED ORDER — NICOTINE 21 MG/24HR TD PT24
21.0000 mg | MEDICATED_PATCH | Freq: Every day | TRANSDERMAL | Status: DC
  Administered 2022-05-29 – 2022-05-31 (×3): 21 mg via TRANSDERMAL
  Filled 2022-05-29 (×4): qty 1

## 2022-05-29 MED ORDER — SODIUM CHLORIDE 0.9 % IV SOLN
10.0000 mL/h | Freq: Once | INTRAVENOUS | Status: AC
  Administered 2022-05-29: 10 mL/h via INTRAVENOUS

## 2022-05-29 MED ORDER — POTASSIUM CHLORIDE CRYS ER 20 MEQ PO TBCR
40.0000 meq | EXTENDED_RELEASE_TABLET | Freq: Once | ORAL | Status: AC
  Administered 2022-05-29: 40 meq via ORAL
  Filled 2022-05-29: qty 2

## 2022-05-29 MED ORDER — POTASSIUM CHLORIDE 10 MEQ/100ML IV SOLN
10.0000 meq | INTRAVENOUS | Status: AC
  Administered 2022-05-29 (×3): 10 meq via INTRAVENOUS
  Filled 2022-05-29 (×3): qty 100

## 2022-05-29 MED ORDER — FLUTICASONE FUROATE-VILANTEROL 200-25 MCG/ACT IN AEPB
1.0000 | INHALATION_SPRAY | Freq: Every day | RESPIRATORY_TRACT | Status: DC
  Administered 2022-06-01: 1 via RESPIRATORY_TRACT
  Filled 2022-05-29 (×2): qty 28

## 2022-05-29 NOTE — H&P (View-Only) (Signed)
Attending physician's note   I have taken a history, reviewed the chart, and examined the patient. I performed a substantive portion of this encounter, including complete performance of at least one of the key components, in conjunction with the APP. I agree with the APP's note, impression, and recommendations with my edits.   76 year old male with medical history as below to include history of COPD, current tobacco use, HTN, presenting with symptomatic iron deficiency anemia.  Similar presentation on 04/04/2022, was hospitalized treated with 2 unit PRBCs, then left AMA.  Iron studies at that time confirmed IDA.  Left with posttransfusion hemoglobin 7.6.  Today, presents with 1-2 weeks of fatigue, SOB, and possibly dark stools.  No previous EGD or colonoscopy.  1) Iron deficiency anemia 2) Symptomatic anemia I had a long conversation with the patient along with his son at bedside in the ER.  We had a candid conversation regarding DDx for symptomatic IDA, to include potential malignant etiology.  He was initially quite set on receiving blood transfusion and leaving.  We discussed the significant risks of untreated profound IDA.  He seems willing to proceed with EGD/colonoscopy.  Plan for the following: - Clears okay now - Bowel prep this evening then n.p.o. at midnight (except medications and prep) - EGD/colonoscopy tomorrow with Dr. Lorenso Courier - Receiving 2 unit PRBCs now - Posttransfusion CBC and serial CBCs with additional blood products as needed per protocol - I strongly urged the patient to not leave AMA immediately after blood transfusion as was his initial intent.  Again, he seems willing to proceed with bowel perforation and procedures tomorrow for diagnostic and potentially therapeutic intent - Would likely benefit from IV iron prior to hospital discharge  3) COPD 4) Tobacco use disorder - Discussed elevated perioperative risks with underlying COPD  5) Hypokalemia - K repletion in  ER - Repeat electrolytes in the morning with additional electrolyte repletion per protocol   The indications, risks, and benefits of EGD and colonoscopy and sedation were explained to the patient and his son in detail. Risks include but are not limited to bleeding, perforation, adverse reaction to medications, and cardiopulmonary compromise. Sequelae include but are not limited to the possibility of surgery, prolonged hospitalization, and mortality. The patient verbalized understanding and wished to proceed.     [address] DO, [place] [phone] office          Consultation  Referring Provider:    Dr. Sherry Ruffing Primary Care Physician:  Susy Frizzle, MD Primary Gastroenterologist:    Althia Forts     Reason for Consultation: Anemia with heme positive stools            HPI:   Brett Vasquez is a 76 y.o. male with a past medical history of COPD and others listed below, who presented to the ED with "weakness".  Patient found to have a hemoglobin of 6.9 and heme positive stools for which we are consulted.    04/04/2022-04/05/2022 hospital admission for the same.  Presented with 1 to 2 weeks of increasing fatigue and shortness of breath.  Described possible dark stools.  Also describes some weight loss.  Apparently had labs done at his PCP which revealed a severe anemia hemoglobin 5.5.  Anemia panel demonstrated severe iron deficiency anemia.  At that time occult blood was negative in the stool.  Patient was hospitalized but ended up leaving AMA before our service was actually consulted.  At that time received the 2 unit transfusion with a  hemoglobin of 7.6 at time of discharge.  Iron infusions were also ordered but he never received them.   Today, patient seen with his son by his bedside.  His son provides a lot of his history as the patient is really uncooperative and unwilling to answer much.  He tells me that he has COPD and it was getting worse, so he went to see his PCP who did labs and  told him to come into the hospital.  He denies any sign of GI bleeding, abdominal pain or reflux.   Patient really seems unwilling to do anything other than get a blood transfusion.  Son is trying to convince him and also getting his daughter involved.   Denies fever or chills.  ED course: Hemoglobin 6.9, MCV 64, fecal occult positive  GI History: None  Past Medical History:  Diagnosis Date   Anemia    COPD (chronic obstructive pulmonary disease) (HCC)    High cholesterol    Hypertension    Smoker unmotivated to quit     No past surgical history on file.  Family History  Problem Relation Age of Onset   Cancer Sister      Social History   Tobacco Use   Smoking status: Every Day    Packs/day: 0.50    Types: Cigarettes   Smokeless tobacco: Never  Vaping Use   Vaping Use: Never used  Substance Use Topics   Alcohol use: No   Drug use: Not Currently    Prior to Admission medications   Medication Sig Start Date End Date Taking? Authorizing Provider  albuterol (VENTOLIN HFA) 108 (90 Base) MCG/ACT inhaler Inhale 2 puffs into the lungs every 6 (six) hours as needed for wheezing or shortness of breath. 10/28/21   [provider]  amLODipine (NORVASC) 10 MG tablet Take 1 tablet (10 mg total) by mouth daily. 10/09/21   Susy Frizzle, MD  amLODipine (NORVASC) 5 MG tablet Take 5 mg by mouth daily. 01/18/22   [provider]  BREZTRI AEROSPHERE 160-9-4.8 MCG/ACT AERO Inhale into the lungs. 04/03/22   [provider]  budesonide-formoterol (SYMBICORT) 160-4.5 MCG/ACT inhaler INHALE 2 PUFFS INTO LUNGS TWICE A DAY 06/10/21   Susy Frizzle, MD  hydrochlorothiazide (HYDRODIURIL) 25 MG tablet Take 1 tablet (25 mg total) by mouth daily. 10/05/20   Susy Frizzle, MD  lisinopril-hydrochlorothiazide (ZESTORETIC) 10-12.5 MG tablet Take 1 tablet by mouth daily. 12/28/21   [provider]  losartan (COZAAR) 50 MG tablet TAKE 1 TABLET (50 MG TOTAL) BY MOUTH  DAILY AT 10 PM. 02/21/22 05/22/22  Tolia, Sunit, DO  meloxicam (MOBIC) 7.5 MG tablet Take 7.5 mg by mouth daily. 04/03/22   [provider]  potassium chloride (KLOR-CON) 10 MEQ tablet Take 1 tablet (10 mEq total) by mouth daily. 10/09/20   Susy Frizzle, MD    Current Facility-Administered Medications  Medication Dose Route Frequency Provider Last Rate Last Admin   0.9 %  sodium chloride infusion  10 mL/hr Intravenous Once Ford, Kelsey N, PA-C       magnesium sulfate IVPB 2 g 50 mL  2 g Intravenous Once Jacqlyn Larsen, PA-C 50 mL/hr at 05/29/22 1503 2 g at 05/29/22 1503   pantoprazole (PROTONIX) injection 40 mg  40 mg Intravenous Once Ford, Kelsey N, PA-C       potassium chloride 10 mEq in 100 mL IVPB  10 mEq Intravenous Q1 Hr x 3 Ford, Kelsey N, PA-C   Stopped at  05/29/22 1536   Current Outpatient Medications  Medication Sig Dispense Refill   albuterol (VENTOLIN HFA) 108 (90 Base) MCG/ACT inhaler Inhale 2 puffs into the lungs every 6 (six) hours as needed for wheezing or shortness of breath.     amLODipine (NORVASC) 10 MG tablet Take 1 tablet (10 mg total) by mouth daily. 90 tablet 0   amLODipine (NORVASC) 5 MG tablet Take 5 mg by mouth daily.     BREZTRI AEROSPHERE 160-9-4.8 MCG/ACT AERO Inhale into the lungs.     budesonide-formoterol (SYMBICORT) 160-4.5 MCG/ACT inhaler INHALE 2 PUFFS INTO LUNGS TWICE A DAY 10.2 each 18   hydrochlorothiazide (HYDRODIURIL) 25 MG tablet Take 1 tablet (25 mg total) by mouth daily. 90 tablet 3   lisinopril-hydrochlorothiazide (ZESTORETIC) 10-12.5 MG tablet Take 1 tablet by mouth daily.     losartan (COZAAR) 50 MG tablet TAKE 1 TABLET (50 MG TOTAL) BY MOUTH DAILY AT 10 PM. 90 tablet 0   meloxicam (MOBIC) 7.5 MG tablet Take 7.5 mg by mouth daily.     potassium chloride (KLOR-CON) 10 MEQ tablet Take 1 tablet (10 mEq total) by mouth daily. 30 tablet 5    Allergies as of 05/29/2022   (No Known Allergies)     Review of Systems:    Constitutional: No  fever or chills Skin: No rash  Cardiovascular: No chest pain   Respiratory: +DOE Gastrointestinal: See HPI and otherwise negative Genitourinary: No dysuria  Neurological: No headache, dizziness or syncope Musculoskeletal: No new muscle or joint pain Hematologic: No bleeding  Psychiatric: No history of depression or anxiety    Physical Exam:  Vital signs in last 24 hours: Temp:  [98.1 F (36.7 C)] 98.1 F (36.7 C) (09/14 1247) Pulse Rate:  [74-98] 74 (09/14 1535) Resp:  [18-25] 18 (09/14 1535) BP: (124-164)/(81-100) 164/81 (09/14 1530) SpO2:  [94 %-100 %] 97 % (09/14 1535) Weight:  [66.5 kg] 66.5 kg (09/14 1256)   General:   Thin appearing, chronically ill AA male appears to be in NAD, alert and uncooperative Head:  Normocephalic and atraumatic. Eyes:   PEERL, EOMI. No icterus. Conjunctiva pink. Ears:  Normal auditory acuity. Neck:  Supple Throat: Oral cavity and pharynx without inflammation, swelling or lesion. Lungs: Respirations even and unlabored. Lungs clear to auscultation bilaterally.   No wheezes, crackles, or rhonchi.  Heart: Normal S1, S2. No MRG. Regular rate and rhythm. No peripheral edema, cyanosis or pallor.  Abdomen:  Soft, nondistended, nontender. No rebound or guarding. Normal bowel sounds. No appreciable masses or hepatomegaly. Rectal:  Patient declined, Hemoccult + in ER  Msk:  Symmetrical without gross deformities. Peripheral pulses intact.  Extremities:  Without edema, no deformity or joint abnormality.  Neurologic:  Alert and  oriented x4;  grossly normal neurologically.  Skin:   Dry and intact without significant lesions or rashes. Psychiatric: Seems irrational   LAB RESULTS: Recent Labs    05/29/22 1254  WBC 8.4  HGB 6.9*  HCT 25.2*  PLT 338   BMET Recent Labs    05/29/22 1254  NA 141  K 2.6*  CL 110  CO2 25  GLUCOSE 136*  BUN 9  CREATININE 1.09  CALCIUM 8.5*   STUDIES: DG Chest 2 View  Result Date: 05/29/2022 CLINICAL DATA:   Shortness of breath. EXAM: CHEST - 2 VIEW COMPARISON:  April 04, 2022. FINDINGS: Mild cardiomegaly is noted. Hyperinflation of the lungs is noted. Lungs are clear. Bony thorax is unremarkable. IMPRESSION: Hyperinflation of the lungs. No acute cardiopulmonary  abnormality seen. Electronically Signed   By: Marijo Conception M.D.   On: 05/29/2022 13:24     Impression / Plan:   Impression: 1.  Symptomatic iron deficiency anemia: Recent admission for the same with hemoglobin of 5 on admission--> 2 units PRBCs--> 7.6 then patient left AMA, now representing with hemoglobin of 6.9--> 1 unit PRBCs ordered, Hemoccult positive, no overt GI bleeding per him but "I do not look", no prior endoscopic evaluation; consider GI bleed versus other 2.  Hypokalemia: 2.6 at time of admission 3.  COPD/current smoker  Plan: 1.  Patient is agreeable to EGD and colonoscopy.  This was scheduled for tomorrow with Dr. Lorenso Courier.  Did review risks, benefits, limitations and alternatives and he agrees to proceed. 2.  Continue supportive measures 3.  Continue to monitor hemoglobin and transfusion as needed 4.  Correct hypokalemia, this will need to be corrected if patient wants procedures 5.  Patient be on clear liquid diet now and n.p.o. at midnight. 6.  Movi prep has been ordered to start at 430 in split dose fashion. 7. Hopefully patient stays to have these procedures  Thank you for your kind consultation, we will continue to follow.  Lavone Nian [facility]  05/29/2022, 3:40 PM

## 2022-05-29 NOTE — ED Notes (Signed)
Medications requested from pharmacy at this time

## 2022-05-29 NOTE — ED Provider Triage Note (Signed)
Emergency Medicine Provider Triage Evaluation Note  JERMEY CLOSS , a 76 y.o. male  was evaluated in triage.  Pt complains of abnormal lab, told to go to ED by physician.  Patient was hospitalized for symptomatic anemia and hypokalemia a few months ago.  He denies any rectal bleeding, blood in stool.  He feels short of breath which comes and goes and is baseline with his COPD, he currently smokes.  Denies chest pain.  Labs patient brought with him today hemoglobin 7.1, potassium 2.9..  Remaining work-up is roughly within normal limits.  Review of Systems  Per HPI  Physical Exam  BP (!) 159/87 (BP Location: Right Arm)   Pulse 98   Temp 98.1 F (36.7 C)   Resp (!) 24   SpO2 94%  Gen:   Awake, no distress  Resp:  Normal effort. Diminished lung sounds MSK:   Moves extremities without difficulty  Other:  Trace edema bilaterally   Medical Decision Making  Medically screening exam initiated at 12:49 PM.  Appropriate orders placed.  [name] was informed that the remainder of the evaluation will be completed by another provider, this initial triage assessment does not replace that evaluation, and the importance of remaining in the ED until their evaluation is complete.     Sherrill Raring, PA-C 05/29/22 1250

## 2022-05-29 NOTE — Consult Note (Addendum)
Attending physician's note   I have taken a history, reviewed the chart, and examined the patient. I performed a substantive portion of this encounter, including complete performance of at least one of the key components, in conjunction with the APP. I agree with the APP's note, impression, and recommendations with my edits.   76 year old male with medical history as below to include history of COPD, current tobacco use, HTN, presenting with symptomatic iron deficiency anemia.  Similar presentation on 04/04/2022, was hospitalized treated with 2 unit PRBCs, then left AMA.  Iron studies at that time confirmed IDA.  Left with posttransfusion hemoglobin 7.6.  Today, presents with 1-2 weeks of fatigue, SOB, and possibly dark stools.  No previous EGD or colonoscopy.  1) Iron deficiency anemia 2) Symptomatic anemia I had a long conversation with the patient along with his son at bedside in the ER.  We had a candid conversation regarding DDx for symptomatic IDA, to include potential malignant etiology.  He was initially quite set on receiving blood transfusion and leaving.  We discussed the significant risks of untreated profound IDA.  He seems willing to proceed with EGD/colonoscopy.  Plan for the following: - Clears okay now - Bowel prep this evening then n.p.o. at midnight (except medications and prep) - EGD/colonoscopy tomorrow with Dr. Lorenso Courier - Receiving 2 unit PRBCs now - Posttransfusion CBC and serial CBCs with additional blood products as needed per protocol - I strongly urged the patient to not leave AMA immediately after blood transfusion as was his initial intent.  Again, he seems willing to proceed with bowel perforation and procedures tomorrow for diagnostic and potentially therapeutic intent - Would likely benefit from IV iron prior to hospital discharge  3) COPD 4) Tobacco use disorder - Discussed elevated perioperative risks with underlying COPD  5) Hypokalemia - K repletion in  ER - Repeat electrolytes in the morning with additional electrolyte repletion per protocol   The indications, risks, and benefits of EGD and colonoscopy and sedation were explained to the patient and his son in detail. Risks include but are not limited to bleeding, perforation, adverse reaction to medications, and cardiopulmonary compromise. Sequelae include but are not limited to the possibility of surgery, prolonged hospitalization, and mortality. The patient verbalized understanding and wished to proceed.     [address], DO, [place] [phone] office          Consultation  Referring Provider:    Dr. Sherry Ruffing Primary Care Physician:  Susy Frizzle, MD Primary Gastroenterologist:    Althia Forts     Reason for Consultation: Anemia with heme positive stools            HPI:   Brett Vasquez is a 76 y.o. male with a past medical history of COPD and others listed below, who presented to the ED with "weakness".  Patient found to have a hemoglobin of 6.9 and heme positive stools for which we are consulted.    04/04/2022-04/05/2022 hospital admission for the same.  Presented with 1 to 2 weeks of increasing fatigue and shortness of breath.  Described possible dark stools.  Also describes some weight loss.  Apparently had labs done at his PCP which revealed a severe anemia hemoglobin 5.5.  Anemia panel demonstrated severe iron deficiency anemia.  At that time occult blood was negative in the stool.  Patient was hospitalized but ended up leaving AMA before our service was actually consulted.  At that time received the 2 unit transfusion with a  hemoglobin of 7.6 at time of discharge.  Iron infusions were also ordered but he never received them.   Today, patient seen with his son by his bedside.  His son provides a lot of his history as the patient is really uncooperative and unwilling to answer much.  He tells me that he has COPD and it was getting worse, so he went to see his PCP who did labs and  told him to come into the hospital.  He denies any sign of GI bleeding, abdominal pain or reflux.   Patient really seems unwilling to do anything other than get a blood transfusion.  Son is trying to convince him and also getting his daughter involved.   Denies fever or chills.  ED course: Hemoglobin 6.9, MCV 64, fecal occult positive  GI History: None  Past Medical History:  Diagnosis Date   Anemia    COPD (chronic obstructive pulmonary disease) (HCC)    High cholesterol    Hypertension    Smoker unmotivated to quit     No past surgical history on file.  Family History  Problem Relation Age of Onset   Cancer Sister      Social History   Tobacco Use   Smoking status: Every Day    Packs/day: 0.50    Types: Cigarettes   Smokeless tobacco: Never  Vaping Use   Vaping Use: Never used  Substance Use Topics   Alcohol use: No   Drug use: Not Currently    Prior to Admission medications   Medication Sig Start Date End Date Taking? Authorizing Provider  albuterol (VENTOLIN HFA) 108 (90 Base) MCG/ACT inhaler Inhale 2 puffs into the lungs every 6 (six) hours as needed for wheezing or shortness of breath. 10/28/21   [provider]  amLODipine (NORVASC) 10 MG tablet Take 1 tablet (10 mg total) by mouth daily. 10/09/21   Susy Frizzle, MD  amLODipine (NORVASC) 5 MG tablet Take 5 mg by mouth daily. 01/18/22   [provider]  BREZTRI AEROSPHERE 160-9-4.8 MCG/ACT AERO Inhale into the lungs. 04/03/22   [provider]  budesonide-formoterol (SYMBICORT) 160-4.5 MCG/ACT inhaler INHALE 2 PUFFS INTO LUNGS TWICE A DAY 06/10/21   Susy Frizzle, MD  hydrochlorothiazide (HYDRODIURIL) 25 MG tablet Take 1 tablet (25 mg total) by mouth daily. 10/05/20   Susy Frizzle, MD  lisinopril-hydrochlorothiazide (ZESTORETIC) 10-12.5 MG tablet Take 1 tablet by mouth daily. 12/28/21   [provider]  losartan (COZAAR) 50 MG tablet TAKE 1 TABLET (50 MG TOTAL) BY MOUTH  DAILY AT 10 PM. 02/21/22 05/22/22  Tolia, Sunit, DO  meloxicam (MOBIC) 7.5 MG tablet Take 7.5 mg by mouth daily. 04/03/22   [provider]  potassium chloride (KLOR-CON) 10 MEQ tablet Take 1 tablet (10 mEq total) by mouth daily. 10/09/20   Susy Frizzle, MD    Current Facility-Administered Medications  Medication Dose Route Frequency Provider Last Rate Last Admin   0.9 %  sodium chloride infusion  10 mL/hr Intravenous Once Ford, Kelsey N, PA-C       magnesium sulfate IVPB 2 g 50 mL  2 g Intravenous Once Jacqlyn Larsen, PA-C 50 mL/hr at 05/29/22 1503 2 g at 05/29/22 1503   pantoprazole (PROTONIX) injection 40 mg  40 mg Intravenous Once Ford, Kelsey N, PA-C       potassium chloride 10 mEq in 100 mL IVPB  10 mEq Intravenous Q1 Hr x 3 Ford, Kelsey N, PA-C   Stopped at  05/29/22 1536   Current Outpatient Medications  Medication Sig Dispense Refill   albuterol (VENTOLIN HFA) 108 (90 Base) MCG/ACT inhaler Inhale 2 puffs into the lungs every 6 (six) hours as needed for wheezing or shortness of breath.     amLODipine (NORVASC) 10 MG tablet Take 1 tablet (10 mg total) by mouth daily. 90 tablet 0   amLODipine (NORVASC) 5 MG tablet Take 5 mg by mouth daily.     BREZTRI AEROSPHERE 160-9-4.8 MCG/ACT AERO Inhale into the lungs.     budesonide-formoterol (SYMBICORT) 160-4.5 MCG/ACT inhaler INHALE 2 PUFFS INTO LUNGS TWICE A DAY 10.2 each 18   hydrochlorothiazide (HYDRODIURIL) 25 MG tablet Take 1 tablet (25 mg total) by mouth daily. 90 tablet 3   lisinopril-hydrochlorothiazide (ZESTORETIC) 10-12.5 MG tablet Take 1 tablet by mouth daily.     losartan (COZAAR) 50 MG tablet TAKE 1 TABLET (50 MG TOTAL) BY MOUTH DAILY AT 10 PM. 90 tablet 0   meloxicam (MOBIC) 7.5 MG tablet Take 7.5 mg by mouth daily.     potassium chloride (KLOR-CON) 10 MEQ tablet Take 1 tablet (10 mEq total) by mouth daily. 30 tablet 5    Allergies as of 05/29/2022   (No Known Allergies)     Review of Systems:    Constitutional: No  fever or chills Skin: No rash  Cardiovascular: No chest pain   Respiratory: +DOE Gastrointestinal: See HPI and otherwise negative Genitourinary: No dysuria  Neurological: No headache, dizziness or syncope Musculoskeletal: No new muscle or joint pain Hematologic: No bleeding  Psychiatric: No history of depression or anxiety    Physical Exam:  Vital signs in last 24 hours: Temp:  [98.1 F (36.7 C)] 98.1 F (36.7 C) (09/14 1247) Pulse Rate:  [74-98] 74 (09/14 1535) Resp:  [18-25] 18 (09/14 1535) BP: (124-164)/(81-100) 164/81 (09/14 1530) SpO2:  [94 %-100 %] 97 % (09/14 1535) Weight:  [66.5 kg] 66.5 kg (09/14 1256)   General:   Thin appearing, chronically ill AA male appears to be in NAD, alert and uncooperative Head:  Normocephalic and atraumatic. Eyes:   PEERL, EOMI. No icterus. Conjunctiva pink. Ears:  Normal auditory acuity. Neck:  Supple Throat: Oral cavity and pharynx without inflammation, swelling or lesion. Lungs: Respirations even and unlabored. Lungs clear to auscultation bilaterally.   No wheezes, crackles, or rhonchi.  Heart: Normal S1, S2. No MRG. Regular rate and rhythm. No peripheral edema, cyanosis or pallor.  Abdomen:  Soft, nondistended, nontender. No rebound or guarding. Normal bowel sounds. No appreciable masses or hepatomegaly. Rectal:  Patient declined, Hemoccult + in ER  Msk:  Symmetrical without gross deformities. Peripheral pulses intact.  Extremities:  Without edema, no deformity or joint abnormality.  Neurologic:  Alert and  oriented x4;  grossly normal neurologically.  Skin:   Dry and intact without significant lesions or rashes. Psychiatric: Seems irrational   LAB RESULTS: Recent Labs    05/29/22 1254  WBC 8.4  HGB 6.9*  HCT 25.2*  PLT 338   BMET Recent Labs    05/29/22 1254  NA 141  K 2.6*  CL 110  CO2 25  GLUCOSE 136*  BUN 9  CREATININE 1.09  CALCIUM 8.5*   STUDIES: DG Chest 2 View  Result Date: 05/29/2022 CLINICAL DATA:   Shortness of breath. EXAM: CHEST - 2 VIEW COMPARISON:  April 04, 2022. FINDINGS: Mild cardiomegaly is noted. Hyperinflation of the lungs is noted. Lungs are clear. Bony thorax is unremarkable. IMPRESSION: Hyperinflation of the lungs. No acute cardiopulmonary  abnormality seen. Electronically Signed   By: Marijo Conception M.D.   On: 05/29/2022 13:24     Impression / Plan:   Impression: 1.  Symptomatic iron deficiency anemia: Recent admission for the same with hemoglobin of 5 on admission--> 2 units PRBCs--> 7.6 then patient left AMA, now representing with hemoglobin of 6.9--> 1 unit PRBCs ordered, Hemoccult positive, no overt GI bleeding per him but "I do not look", no prior endoscopic evaluation; consider GI bleed versus other 2.  Hypokalemia: 2.6 at time of admission 3.  COPD/current smoker  Plan: 1.  Patient is agreeable to EGD and colonoscopy.  This was scheduled for tomorrow with Dr. Lorenso Courier.  Did review risks, benefits, limitations and alternatives and he agrees to proceed. 2.  Continue supportive measures 3.  Continue to monitor hemoglobin and transfusion as needed 4.  Correct hypokalemia, this will need to be corrected if patient wants procedures 5.  Patient be on clear liquid diet now and n.p.o. at midnight. 6.  Movi prep has been ordered to start at 430 in split dose fashion. 7. Hopefully patient stays to have these procedures  Thank you for your kind consultation, we will continue to follow.  Lavone Nian [facility]  05/29/2022, 3:40 PM

## 2022-05-29 NOTE — ED Triage Notes (Signed)
Pt reports was feeling weak, hx of anemia, had labs drawn Tues. Called today by PCP to come to ED because of abnormal labs, K 2.0 Hgb 7.1. Pt denies CP, does c/o SOb, hx of COPD

## 2022-05-29 NOTE — H&P (Signed)
History and Physical    EMMETT BRACKNELL OAC:[id number] DOB: 06/02/1946 DOA: 05/29/2022  PCP: Susy Frizzle, MD (Confirm with patient/family/NH records and if not entered, this has to be entered at [facility] point of entry) Patient coming from: Home  I have personally briefly reviewed patient's old medical records in [place]  Chief Complaint: Feeling tired  HPI: Brett Vasquez is a 76 y.o. male with medical history significant of chronic iron deficiency anemia secondary to GI bleed, COPD, HTN, HLD, coronary hypokalemia, cigarette smoke, presented with increasing shortness of breath and fatigue.  Patient was hospitalized 2 months ago for similar presentation, and was found to have iron deficiency anemia and positive guaiac stool.  Plan was for the patient to have EGD and endoscopy however patient signed out AMA.  Starting about 2 weeks ago, patient again developed increasing fatigue and shortness of breath, no cough no chest pain denies any lightheadedness or near syncope syndrome.  He said that he has been feeling some " annoying discomfort" on his epigastric area and has been thinking meloxicam occasionally with no significant improvement.  He denies any black tarry stool or blood in the stool.  ED Course: Blood pressure borderline low, no tachycardia afebrile.  Hemoglobin 6.9 compared to 7.6, 3 weeks ago.  FOBT positive.  PRBC x1. K=2.6.  Review of Systems: As per HPI otherwise 14 point review of systems negative.    Past Medical History:  Diagnosis Date   Anemia    COPD (chronic obstructive pulmonary disease) (HCC)    High cholesterol    Hypertension    Smoker unmotivated to quit     No past surgical history on file.   reports that he has been smoking cigarettes. He has been smoking an average of .5 packs per day. He has never used smokeless tobacco. He reports that he does not currently use drugs. He reports that he does not drink alcohol.  No Known Allergies  Family  History  Problem Relation Age of Onset   Cancer Sister      Prior to Admission medications   Medication Sig Start Date End Date Taking? Authorizing Provider  albuterol (VENTOLIN HFA) 108 (90 Base) MCG/ACT inhaler Inhale 2 puffs into the lungs every 6 (six) hours as needed for wheezing or shortness of breath. 10/28/21   [provider]  amLODipine (NORVASC) 10 MG tablet Take 1 tablet (10 mg total) by mouth daily. 10/09/21   Susy Frizzle, MD  amLODipine (NORVASC) 5 MG tablet Take 5 mg by mouth daily. 01/18/22   [provider]  BREZTRI AEROSPHERE 160-9-4.8 MCG/ACT AERO Inhale into the lungs. 04/03/22   [provider]  budesonide-formoterol (SYMBICORT) 160-4.5 MCG/ACT inhaler INHALE 2 PUFFS INTO LUNGS TWICE A DAY 06/10/21   Susy Frizzle, MD  hydrochlorothiazide (HYDRODIURIL) 25 MG tablet Take 1 tablet (25 mg total) by mouth daily. 10/05/20   Susy Frizzle, MD  lisinopril-hydrochlorothiazide (ZESTORETIC) 10-12.5 MG tablet Take 1 tablet by mouth daily. 12/28/21   [provider]  losartan (COZAAR) 50 MG tablet TAKE 1 TABLET (50 MG TOTAL) BY MOUTH DAILY AT 10 PM. 02/21/22 05/22/22  Tolia, Sunit, DO  meloxicam (MOBIC) 7.5 MG tablet Take 7.5 mg by mouth daily. 04/03/22   [provider]  potassium chloride (KLOR-CON) 10 MEQ tablet Take 1 tablet (10 mEq total) by mouth daily. 10/09/20   Susy Frizzle, MD    Physical Exam: Vitals:   05/29/22 1600 05/29/22 1617 05/29/22 1632 05/29/22  1645  BP: (!) 141/83 (!) 105/90 (!) 134/91 110/84  Pulse: 66 75 79 81  Resp: (!) 24 (!) 21 18 (!) 22  Temp:  97.9 F (36.6 C)    TempSrc:  Oral    SpO2: 99%   97%  Weight:      Height:        Constitutional: NAD, calm, comfortable Vitals:   05/29/22 1600 05/29/22 1617 05/29/22 1632 05/29/22 1645  BP: (!) 141/83 (!) 105/90 (!) 134/91 110/84  Pulse: 66 75 79 81  Resp: (!) 24 (!) 21 18 (!) 22  Temp:  97.9 F (36.6 C)    TempSrc:  Oral    SpO2: 99%   97%   Weight:      Height:       Eyes: PERRL, lids and conjunctivae normal ENMT: Mucous membranes are moist. Posterior pharynx clear of any exudate or lesions.Normal dentition.  Neck: normal, supple, no masses, no thyromegaly Respiratory: clear to auscultation bilaterally, no wheezing, no crackles. Normal respiratory effort. No accessory muscle use.  Cardiovascular: Regular rate and rhythm, no murmurs / rubs / gallops. No extremity edema. 2+ pedal pulses. No carotid bruits.  Abdomen: no tenderness, no masses palpated. No hepatosplenomegaly. Bowel sounds positive.  Musculoskeletal: no clubbing / cyanosis. No joint deformity upper and lower extremities. Good ROM, no contractures. Normal muscle tone.  Skin: no rashes, lesions, ulcers. No induration Neurologic: CN 2-12 grossly intact. Sensation intact, DTR normal. Strength 5/5 in all 4.  Psychiatric: Normal judgment and insight. Alert and oriented x 3. Normal mood.     Labs on Admission: I have personally reviewed following labs and imaging studies  CBC: Recent Labs  Lab 05/29/22 1254  WBC 8.4  NEUTROABS 5.5  HGB 6.9*  HCT 25.2*  MCV 64.0*  PLT 824   Basic Metabolic Panel: Recent Labs  Lab 05/29/22 1254  NA 141  K 2.6*  CL 110  CO2 25  GLUCOSE 136*  BUN 9  CREATININE 1.09  CALCIUM 8.5*  MG 1.6*   GFR: Estimated Creatinine Clearance: 52.5 mL/min (by C-G formula based on SCr of 1.09 mg/dL). Liver Function Tests: No results for input(s): "AST", "ALT", "ALKPHOS", "BILITOT", "PROT", "ALBUMIN" in the last 168 hours. No results for input(s): "LIPASE", "AMYLASE" in the last 168 hours. No results for input(s): "AMMONIA" in the last 168 hours. Coagulation Profile: No results for input(s): "INR", "PROTIME" in the last 168 hours. Cardiac Enzymes: No results for input(s): "CKTOTAL", "CKMB", "CKMBINDEX", "TROPONINI" in the last 168 hours. BNP (last 3 results) No results for input(s): "PROBNP" in the last 8760 hours. HbA1C: No  results for input(s): "HGBA1C" in the last 72 hours. CBG: No results for input(s): "GLUCAP" in the last 168 hours. Lipid Profile: No results for input(s): "CHOL", "HDL", "LDLCALC", "TRIG", "CHOLHDL", "LDLDIRECT" in the last 72 hours. Thyroid Function Tests: No results for input(s): "TSH", "T4TOTAL", "FREET4", "T3FREE", "THYROIDAB" in the last 72 hours. Anemia Panel: Recent Labs    05/29/22 1447  RETICCTPCT 0.7   Urine analysis: No results found for: "COLORURINE", "APPEARANCEUR", "LABSPEC", "PHURINE", "GLUCOSEU", "HGBUR", "BILIRUBINUR", "KETONESUR", "PROTEINUR", "UROBILINOGEN", "NITRITE", "LEUKOCYTESUR"  Radiological Exams on Admission: DG Chest 2 View  Result Date: 05/29/2022 CLINICAL DATA:  Shortness of breath. EXAM: CHEST - 2 VIEW COMPARISON:  April 04, 2022. FINDINGS: Mild cardiomegaly is noted. Hyperinflation of the lungs is noted. Lungs are clear. Bony thorax is unremarkable. IMPRESSION: Hyperinflation of the lungs. No acute cardiopulmonary abnormality seen. Electronically Signed   By: Sabino Dick  Jr M.D.   On: 05/29/2022 13:24    EKG: Independently reviewed.  Sinus, no acute ST changes.  Assessment/Plan Principal Problem:   GI bleed Active Problems:   Symptomatic anemia  (please populate well all problems here in Problem List. (For example, if patient is on BP meds at home and you resume or decide to hold them, it is a problem that needs to be her. Same for CAD, COPD, HLD and so on)  Acute on chronic symptomatic anemia secondary to GI bleed, probably chronic -Established iron deficiency anemia. -PRBC x1, PPI twice daily, recheck H&H tonight and tomorrow morning and transfuse for hemodynamic instability -GI consulted, EGD and colonoscopy planned. -Hold off home BP meds  Severe hypokalemia -Probably secondary to HCTZ, despite on ACEI at same time.  Now all home BP meds on hold now, consider switch HCTZ to other BM meds.  COPD -Stable, continue Pulmicort and as needed  breathing meds  HTN -Home BP meds on hold, continue as needed hydralazine for now.  Cigarette smoke -Cessation education performed bedside, patient however has no interest in stop smoking.  DVT prophylaxis: SCD Code Status: Full code Family Communication: Son at bedside Disposition Plan: Patient sick with recurrent symptomatic anemia secondary to GI bleed, requiring inpatient GI work-up, and transfusion, expect more than 2 midnight hospital stay. Consults called: GI Admission status: Telemetry admission   Lequita Halt MD [facility] Pager [phone]  05/29/2022, 5:18 PM

## 2022-05-29 NOTE — ED Provider Notes (Signed)
[place] EMERGENCY DEPARTMENT Provider Note   CSN: [id number] Arrival date & time: 05/29/22  1241     History {Add pertinent medical, surgical, social history, OB history to HPI:1} Chief Complaint  Patient presents with   Weakness    Brett Vasquez is a 76 y.o. male.   Weakness      Home Medications Prior to Admission medications   Medication Sig Start Date End Date Taking? Authorizing Provider  albuterol (VENTOLIN HFA) 108 (90 Base) MCG/ACT inhaler Inhale 2 puffs into the lungs every 6 (six) hours as needed for wheezing or shortness of breath. 10/28/21  Yes [provider]  Aspirin-Salicylamide-Caffeine (BC HEADACHE POWDER PO) Take 1 packet by mouth daily as needed (pain relief).   Yes [provider]  budesonide-formoterol (SYMBICORT) 160-4.5 MCG/ACT inhaler INHALE 2 PUFFS INTO LUNGS TWICE A DAY 06/10/21  Yes Susy Frizzle, MD  losartan (COZAAR) 50 MG tablet TAKE 1 TABLET (50 MG TOTAL) BY MOUTH DAILY AT 10 PM. 02/21/22 05/29/22 Yes Tolia, Sunit, DO  amLODipine (NORVASC) 5 MG tablet Take 5 mg by mouth daily. Patient not taking: Reported on 05/29/2022 01/18/22   [provider]  lisinopril-hydrochlorothiazide (ZESTORETIC) 10-12.5 MG tablet Take 1 tablet by mouth daily. Patient not taking: Reported on 05/29/2022 12/28/21   [provider]  meloxicam (MOBIC) 7.5 MG tablet Take 7.5 mg by mouth daily. Patient not taking: Reported on 05/29/2022 04/03/22   [provider]      Allergies    Patient has no known allergies.    Review of Systems   Review of Systems  Neurological:  Positive for weakness.    Physical Exam Updated Vital Signs BP (!) 142/81   Pulse 74   Temp 98.1 F (36.7 C)   Resp 18   Ht 5' 9"  (1.753 m)   Wt 66.5 kg   SpO2 99%   BMI 21.65 kg/m  Physical Exam  ED Results / Procedures / Treatments   Labs (all labs ordered are listed, but only abnormal results are displayed) Labs Reviewed   BASIC METABOLIC PANEL - Abnormal; Notable for the following components:      Result Value   Potassium 2.6 (*)    Glucose, Bld 136 (*)    Calcium 8.5 (*)    All other components within normal limits  MAGNESIUM - Abnormal; Notable for the following components:   Magnesium 1.6 (*)    All other components within normal limits  CBC WITH DIFFERENTIAL/PLATELET - Abnormal; Notable for the following components:   RBC 3.94 (*)    Hemoglobin 6.9 (*)    HCT 25.2 (*)    MCV 64.0 (*)    MCH 17.5 (*)    MCHC 27.4 (*)    RDW 22.3 (*)    All other components within normal limits  RETICULOCYTES - Abnormal; Notable for the following components:   RBC. 4.02 (*)    Immature Retic Fract 25.6 (*)    All other components within normal limits  POC OCCULT BLOOD, ED - Abnormal; Notable for the following components:   Fecal Occult Bld POSITIVE (*)    All other components within normal limits  VITAMIN B12  FOLATE  IRON AND TIBC  FERRITIN  POTASSIUM  HEMOGLOBIN AND HEMATOCRIT, BLOOD  CBC WITH DIFFERENTIAL/PLATELET  COMPREHENSIVE METABOLIC PANEL  TYPE AND SCREEN  PREPARE RBC (CROSSMATCH)    EKG EKG Interpretation  Date/Time:  Thursday May 29 2022 12:49:44 EDT Ventricular Rate:  94 PR Interval:  134 QRS Duration: 92 QT Interval:  378 QTC Calculation: 472 R Axis:   -41 Text Interpretation: Normal sinus rhythm Possible Left atrial enlargement Left axis deviation Minimal voltage criteria for LVH, may be normal variant ( Cornell product ) Septal infarct , age undetermined Abnormal ECG When compared with ECG of 04-Apr-2022 16:36, PREVIOUS ECG IS PRESENT when compared to prior, faster rate than prior No STEMI Confirmed by Antony Blackbird [phone]) on 05/29/2022 2:01:52 PM  Radiology DG Chest 2 View  Result Date: 05/29/2022 CLINICAL DATA:  Shortness of breath. EXAM: CHEST - 2 VIEW COMPARISON:  April 04, 2022. FINDINGS: Mild cardiomegaly is noted. Hyperinflation of the lungs is noted. Lungs are clear.  Bony thorax is unremarkable. IMPRESSION: Hyperinflation of the lungs. No acute cardiopulmonary abnormality seen. Electronically Signed   By: Marijo Conception M.D.   On: 05/29/2022 13:24    Procedures Procedures  {Document cardiac monitor, telemetry assessment procedure when appropriate:1}  Medications Ordered in ED Medications  potassium chloride SA (KLOR-CON M) CR tablet 40 mEq (40 mEq Oral Patient Refused/Not Given 05/29/22 1700)  peg 3350 powder (MOVIPREP) kit 200 g (has no administration in time range)  nicotine (NICODERM CQ - dosed in mg/24 hours) patch 21 mg (has no administration in time range)  hydrALAZINE (APRESOLINE) injection 5 mg (has no administration in time range)  potassium chloride (KLOR-CON M) CR tablet 10 mEq (10 mEq Oral Patient Refused/Not Given 05/29/22 1855)  fluticasone furoate-vilanterol (BREO ELLIPTA) 200-25 MCG/ACT 1 puff (1 puff Inhalation Not Given 05/29/22 1945)  albuterol (PROVENTIL) (2.5 MG/3ML) 0.083% nebulizer solution 2.5 mg (has no administration in time range)  magnesium sulfate IVPB 2 g 50 mL (0 g Intravenous Stopped 05/29/22 1602)  potassium chloride SA (KLOR-CON M) CR tablet 40 mEq (40 mEq Oral Given 05/29/22 1459)  potassium chloride 10 mEq in 100 mL IVPB (0 mEq Intravenous Stopped 05/29/22 1830)  0.9 %  sodium chloride infusion (0 mL/hr Intravenous Stopped 05/29/22 1830)  pantoprazole (PROTONIX) injection 40 mg (40 mg Intravenous Given 05/29/22 1543)    ED Course/ Medical Decision Making/ A&P                           Medical Decision Making Amount and/or Complexity of Data Reviewed Labs: ordered.  Risk Prescription drug management. Decision regarding hospitalization.   ***  {Document critical care time when appropriate:1} {Document review of labs and clinical decision tools ie heart score, Chads2Vasc2 etc:1}  {Document your independent review of radiology images, and any outside records:1} {Document your discussion with family members,  caretakers, and with consultants:1} {Document social determinants of health affecting pt's care:1} {Document your decision making why or why not admission, treatments were needed:1} Final Clinical Impression(s) / ED Diagnoses Final diagnoses:  Acute GI bleeding  Symptomatic anemia    Rx / DC Orders ED Discharge Orders     None

## 2022-05-29 NOTE — ED Notes (Signed)
Patient assisted to the bedside urinal and repositioned by nurse tech into bedside recliner. Patent ambulates with ease at the side of the bed. Call bell in reach

## 2022-05-29 NOTE — ED Notes (Signed)
Blood consent signed by patient and this RN signed as witness. Patient verbalized understanding of the risks and benefits involved and had no other questions.

## 2022-05-30 DIAGNOSIS — J449 Chronic obstructive pulmonary disease, unspecified: Secondary | ICD-10-CM

## 2022-05-30 DIAGNOSIS — K922 Gastrointestinal hemorrhage, unspecified: Secondary | ICD-10-CM | POA: Diagnosis not present

## 2022-05-30 DIAGNOSIS — R5383 Other fatigue: Secondary | ICD-10-CM | POA: Diagnosis not present

## 2022-05-30 DIAGNOSIS — R410 Disorientation, unspecified: Secondary | ICD-10-CM

## 2022-05-30 DIAGNOSIS — E876 Hypokalemia: Secondary | ICD-10-CM

## 2022-05-30 DIAGNOSIS — D500 Iron deficiency anemia secondary to blood loss (chronic): Secondary | ICD-10-CM

## 2022-05-30 DIAGNOSIS — D649 Anemia, unspecified: Secondary | ICD-10-CM

## 2022-05-30 DIAGNOSIS — R0602 Shortness of breath: Secondary | ICD-10-CM

## 2022-05-30 DIAGNOSIS — I10 Essential (primary) hypertension: Secondary | ICD-10-CM

## 2022-05-30 DIAGNOSIS — F17200 Nicotine dependence, unspecified, uncomplicated: Secondary | ICD-10-CM

## 2022-05-30 LAB — CBC WITH DIFFERENTIAL/PLATELET
Abs Immature Granulocytes: 0.02 10*3/uL (ref 0.00–0.07)
Basophils Absolute: 0.1 10*3/uL (ref 0.0–0.1)
Basophils Relative: 1 %
Eosinophils Absolute: 0.2 10*3/uL (ref 0.0–0.5)
Eosinophils Relative: 3 %
HCT: 28.8 % — ABNORMAL LOW (ref 39.0–52.0)
Hemoglobin: 8.1 g/dL — ABNORMAL LOW (ref 13.0–17.0)
Immature Granulocytes: 0 %
Lymphocytes Relative: 25 %
Lymphs Abs: 1.6 10*3/uL (ref 0.7–4.0)
MCH: 18.5 pg — ABNORMAL LOW (ref 26.0–34.0)
MCHC: 28.1 g/dL — ABNORMAL LOW (ref 30.0–36.0)
MCV: 65.8 fL — ABNORMAL LOW (ref 80.0–100.0)
Monocytes Absolute: 0.4 10*3/uL (ref 0.1–1.0)
Monocytes Relative: 6 %
Neutro Abs: 4 10*3/uL (ref 1.7–7.7)
Neutrophils Relative %: 65 %
Platelets: 301 10*3/uL (ref 150–400)
RBC: 4.38 MIL/uL (ref 4.22–5.81)
RDW: 23.1 % — ABNORMAL HIGH (ref 11.5–15.5)
WBC: 6.3 10*3/uL (ref 4.0–10.5)
nRBC: 0 % (ref 0.0–0.2)

## 2022-05-30 LAB — BPAM RBC
Blood Product Expiration Date: 202310152359
ISSUE DATE / TIME: 202309141606
Unit Type and Rh: 5100

## 2022-05-30 LAB — FERRITIN: Ferritin: 5 ng/mL — ABNORMAL LOW (ref 24–336)

## 2022-05-30 LAB — TYPE AND SCREEN
ABO/RH(D): O POS
Antibody Screen: NEGATIVE
Unit division: 0

## 2022-05-30 LAB — COMPREHENSIVE METABOLIC PANEL
ALT: 11 U/L (ref 0–44)
AST: 19 U/L (ref 15–41)
Albumin: 3.6 g/dL (ref 3.5–5.0)
Alkaline Phosphatase: 78 U/L (ref 38–126)
Anion gap: 10 (ref 5–15)
BUN: 9 mg/dL (ref 8–23)
CO2: 26 mmol/L (ref 22–32)
Calcium: 8.9 mg/dL (ref 8.9–10.3)
Chloride: 106 mmol/L (ref 98–111)
Creatinine, Ser: 0.98 mg/dL (ref 0.61–1.24)
GFR, Estimated: >60 mL/min (ref >60)
Glucose, Bld: 85 mg/dL (ref 70–99)
Potassium: 2.9 mmol/L — ABNORMAL LOW (ref 3.5–5.1)
Sodium: 142 mmol/L (ref 135–145)
Total Bilirubin: 1.7 mg/dL — ABNORMAL HIGH (ref 0.3–1.2)
Total Protein: 6 g/dL — ABNORMAL LOW (ref 6.5–8.1)

## 2022-05-30 LAB — MAGNESIUM: Magnesium: 1.9 mg/dL (ref 1.7–2.4)

## 2022-05-30 LAB — IRON AND TIBC
Iron: 33 ug/dL — ABNORMAL LOW (ref 45–182)
Saturation Ratios: 8 % — ABNORMAL LOW (ref 17.9–39.5)
TIBC: 440 ug/dL (ref 250–450)
UIBC: 407 ug/dL

## 2022-05-30 LAB — VITAMIN B12: Vitamin B-12: 412 pg/mL (ref 180–914)

## 2022-05-30 MED ORDER — POTASSIUM CHLORIDE CRYS ER 20 MEQ PO TBCR
40.0000 meq | EXTENDED_RELEASE_TABLET | ORAL | Status: DC
  Administered 2022-05-30: 40 meq via ORAL
  Filled 2022-05-30: qty 2

## 2022-05-30 MED ORDER — POTASSIUM CHLORIDE 20 MEQ PO PACK
40.0000 meq | PACK | ORAL | Status: AC
  Administered 2022-05-30: 40 meq via ORAL
  Filled 2022-05-30: qty 2

## 2022-05-30 MED ORDER — PEG-KCL-NACL-NASULF-NA ASC-C 100 G PO SOLR
0.5000 | Freq: Once | ORAL | Status: AC
  Administered 2022-05-31: 100 g via ORAL
  Filled 2022-05-30: qty 1

## 2022-05-30 MED ORDER — PEG-KCL-NACL-NASULF-NA ASC-C 100 G PO SOLR
0.5000 | Freq: Once | ORAL | Status: AC
  Administered 2022-05-30: 100 g via ORAL
  Filled 2022-05-30: qty 1

## 2022-05-30 MED ORDER — SODIUM CHLORIDE 0.9 % IV SOLN
250.0000 mg | Freq: Every day | INTRAVENOUS | Status: AC
  Administered 2022-05-30 – 2022-05-31 (×2): 250 mg via INTRAVENOUS
  Filled 2022-05-30 (×2): qty 20

## 2022-05-30 MED ORDER — SODIUM CHLORIDE 0.9 % IV SOLN: INTRAVENOUS | Status: DC

## 2022-05-30 MED ORDER — PEG-KCL-NACL-NASULF-NA ASC-C 100 G PO SOLR: 1.0000 | Freq: Once | ORAL | Status: DC

## 2022-05-30 MED ORDER — LABETALOL HCL 5 MG/ML IV SOLN: 10.0000 mg | INTRAVENOUS | Status: DC | PRN

## 2022-05-30 MED ORDER — POTASSIUM CHLORIDE 10 MEQ/100ML IV SOLN
10.0000 meq | INTRAVENOUS | Status: DC
  Administered 2022-05-30: 10 meq via INTRAVENOUS
  Filled 2022-05-30: qty 100

## 2022-05-30 MED ORDER — AMLODIPINE BESYLATE 10 MG PO TABS
10.0000 mg | ORAL_TABLET | Freq: Every day | ORAL | Status: DC
  Administered 2022-05-30 – 2022-06-01 (×3): 10 mg via ORAL
  Filled 2022-05-30: qty 1
  Filled 2022-05-30: qty 2
  Filled 2022-05-30: qty 1

## 2022-05-30 NOTE — Plan of Care (Signed)
  Problem: Education: Goal: Knowledge of General Education information will improve Description Including pain rating scale, medication(s)/side effects and non-pharmacologic comfort measures Outcome: Progressing   Problem: Health Behavior/Discharge Planning: Goal: Ability to manage health-related needs will improve Outcome: Progressing   

## 2022-05-30 NOTE — ED Notes (Signed)
Pt trying to get out of chair and pulled IV line to RAC, catheter came out of arm. Site dressed and catheter intact

## 2022-05-30 NOTE — Progress Notes (Signed)
Permit signed for EGD and Colonoscopy for tomorrow.

## 2022-05-30 NOTE — Progress Notes (Signed)
Gastroenterology Inpatient Follow Up    Subjective: Patient initially did not want to drink the colonoscopy preparation because he felt that it tasted bad. He did drink a little bit of prep. He has not had a BM after he started drinking the prep. Patient asked for cold water with ice to wash down the prep with. He is willing to drink more colon prep for EGD/colonoscopy tomorrow.  Objective: Vital signs in last 24 hours: Temp:  [97.3 F (36.3 C)-98.4 F (36.9 C)] 97.3 F (36.3 C) (09/15 0841) Pulse Rate:  [56-101] 75 (09/15 0841) Resp:  [16-29] 20 (09/15 0841) BP: (105-194)/(61-120) 162/86 (09/15 0841) SpO2:  [92 %-100 %] 100 % (09/15 0841) Weight:  [66.5 kg] 66.5 kg (09/14 1256)    Intake/Output from previous day: 09/14 0701 - 09/15 0700 In: 560.5 [I.V.:0.5; Blood:560] Out: -  Intake/Output this shift: Total I/O In: -  Out: 1275 [Urine:1275]  General appearance: alert Resp: no increased WOB Cardio: regular rate GI: soft, non-tender; bowel sounds normal; no masses,  no organomegaly Extremities: No edema  Lab Results: Recent Labs    05/29/22 1254 05/29/22 2057 05/30/22 0410  WBC 8.4  --  6.3  HGB 6.9* 7.1* 8.1*  HCT 25.2* 25.1* 28.8*  PLT 338  --  301   BMET Recent Labs    05/29/22 1254 05/29/22 2057 05/30/22 0410  NA 141  --  142  K 2.6* 3.0* 2.9*  CL 110  --  106  CO2 25  --  26  GLUCOSE 136*  --  85  BUN 9  --  9  CREATININE 1.09  --  0.98  CALCIUM 8.5*  --  8.9   LFT Recent Labs    05/30/22 0410  PROT 6.0*  ALBUMIN 3.6  AST 19  ALT 11  ALKPHOS 78  BILITOT 1.7*   PT/INR No results for input(s): "LABPROT", "INR" in the last 72 hours. Hepatitis Panel No results for input(s): "HEPBSAG", "HCVAB", "HEPAIGM", "HEPBIGM" in the last 72 hours. C-Diff No results for input(s): "CDIFFTOX" in the last 72 hours.  Studies/Results: DG Chest 2 View  Result Date: 05/29/2022 CLINICAL DATA:  Shortness of breath. EXAM: CHEST - 2 VIEW COMPARISON:   April 04, 2022. FINDINGS: Mild cardiomegaly is noted. Hyperinflation of the lungs is noted. Lungs are clear. Bony thorax is unremarkable. IMPRESSION: Hyperinflation of the lungs. No acute cardiopulmonary abnormality seen. Electronically Signed   By: Marijo Conception M.D.   On: 05/29/2022 13:24    Medications: I have reviewed the patient's current medications. Scheduled:  fluticasone furoate-vilanterol  1 puff Inhalation Daily   nicotine  21 mg Transdermal Daily   potassium chloride  10 mEq Oral Daily   potassium chloride  40 mEq Oral Once   Continuous:  sodium chloride     ferric gluconate (FERRLECIT) IVPB     potassium chloride     OLM:[id number], labetalol  Assessment/Plan: 76 year old male with history of COPD, tobacco use, and HTN presents with symptomatic IDA. Possibly has had some dark stools. Found to have Hb of 6.9 upon arrival. With 1 U pRBC transfusion, Hb is now 8.1. Ferritin low at 5. FOBT positive. He has never had an EGD or colonoscopy in the past. Patient was originally scheduled to have EGD/colonoscopy today, but he was not able to complete the prep in time for the procedures. I went over the reasoning for the laxative preparation and the procedures with the patient again. I did inform the  patient that he would have to do additional prep until his stools are clear. The patient is agreeable to this. We will plan for EGD/colonoscopy tomorrow instead. Will order additional Moviprep for this evening in case he needs additional prep in order to achieve clear stools.   LOS: 1 day   Sharyn Creamer 05/30/2022, 9:05 AM

## 2022-05-30 NOTE — Progress Notes (Signed)
Pt to 6N30 from ED. Pt is alert, oriented x4. Assisted to BR to void, pt voided small amt. Oriented to the room and dept.

## 2022-05-30 NOTE — ED Notes (Signed)
Patient was given a Engineer, maintenance (IT).

## 2022-05-30 NOTE — Progress Notes (Signed)
PROGRESS NOTE  Brett Vasquez [facility]:[id number] DOB: [birth year]   PCP: Susy Frizzle, MD  Patient is from: Home.  Lives with brother.  Independently ambulates at baseline.  DOA: 05/29/2022 LOS: 1  Chief complaints Chief Complaint  Patient presents with   Weakness     Brief Narrative / Interim history: 76 year old M with PMH of IDA/GIB, COPD, HTN, HLD, hypokalemia and tobacco use disorder presenting with progressive shortness of breath, fatigue and epigastric discomfort for over 2 weeks, and admitted for symptomatic iron deficiency anemia in the setting of upper GI bleed, and hypokalemia.  Patient admits to using [facility] powder occasionally.  Hgb 6.9 from 7.6 about 3 weeks ago.  Hemoccult positive.  1 unit of blood ordered.  GI consulted.   GI planning EGD/colonoscopy on 9/16.  Hgb improved to 8.1 after 1 unit.  Patient denies melena, hematochezia or hematemesis.  Subjective: Seen and examined earlier this morning.  No major events overnight of this morning.  Sitting on bedside chair.  Has no complaints.  He denies melena, hematochezia or hematemesis.  Admits to using BC powder occasionally.  Objective: Vitals:   05/30/22 0630 05/30/22 0645 05/30/22 0700 05/30/22 0841  BP: (!) 168/82 (!) 180/82 (!) 165/120 (!) 162/86  Pulse: 68 69 75 75  Resp: (!) 23 (!) 23 (!) 24 20  Temp:    (!) 97.3 F (36.3 C)  TempSrc:    Oral  SpO2: 92% 100% 100% 100%  Weight:      Height:        Examination:  GENERAL: No apparent distress.  Nontoxic. HEENT: MMM.  Vision and hearing grossly intact.  NECK: Supple.  No apparent JVD.  RESP:  No IWOB.  Fair aeration bilaterally. CVS:  RRR. Heart sounds normal.  ABD/GI/GU: BS+. Abd soft, NTND.  MSK/EXT:  Moves extremities. No apparent deformity. No edema.  SKIN: no apparent skin lesion or wound NEURO: Awake, alert and oriented fairly.  No apparent focal neuro deficit. PSYCH: Calm. Normal affect.   Procedures:  None  Microbiology  summarized: None  Assessment and plan: Principal Problem:   GI bleed Active Problems:   Tobacco use disorder   Hypertensive disorder   Symptomatic anemia   Iron deficiency anemia   Chronic obstructive lung disease (HCC)   Hypokalemia   Delirium   Fatigue   Shortness of breath  Symptomatic iron deficiency anemia likely due to upper GI bleed: Patient denies melena, hematochezia or hematemesis.  Denies hematuria either.  Hemoccult positive.  Anemia panel with severe iron deficiency.  Admits to using BC powder.  Transfused 1 unit with appropriate response. Recent Labs    04/04/22 1607 04/05/22 0638 05/29/22 1254 05/29/22 2057 05/30/22 0410  HGB 5.5* 7.6* 6.9* 7.1* 8.1*  -GI following-plan for EGD and colonoscopy on 9/16. -Monitor H&H.  Transfuse for Hgb <7.0 or symptoms. -IV ferric gluconate 250 mg daily x2 doses -Advised to refrain from NSAID use -Continue PPI -Clear liquid diet, and n.p.o. after midnight per GI  Hypokalemia: Likely due to HCTZ and ACE inhibitor's.  K2.9.  Mg normal. -P.o. KCl 40 mill equivalent x3 -Continue holding HCTZ and ACE inhibitor's.  Chronic COPD/tobacco use disorder: Stable.  Smokes about a pack a day.  About 60-pack-year history.  Not motivated to stop. -Nicotine patch  Essential hypertension: BP slightly elevated but improved. -Amlodipine 10 mg daily -IV labetalol as needed  Shortness of breath/fatigue: Likely due to anemia and underlying COPD -Manage anemia as above -PT/OT eval  Hospital delirium: Might  have underlying cognitive impairment although no history of this -Reorientation and delirium precautions -Minimize sedating medications -Fall precaution    Body mass index is 21.65 kg/m.           DVT prophylaxis:  SCDs Start: 05/29/22 1828  Code Status: Full code Family Communication: None at bedside Level of care: Telemetry Medical Status is: Inpatient Remains inpatient appropriate because: GI evaluation for symptomatic  severe iron deficiency anemia   Final disposition: Likely home once medically cleared Consultants:  GI  Sch Meds:  Scheduled Meds:  amLODipine  10 mg Oral Daily   fluticasone furoate-vilanterol  1 puff Inhalation Daily   nicotine  21 mg Transdermal Daily   peg 3350 powder  1 kit Oral Once   potassium chloride  40 mEq Oral Q4H   Continuous Infusions:  sodium chloride 20 mL/hr at 05/30/22 1006   ferric gluconate (FERRLECIT) IVPB 250 mg (05/30/22 1008)   PRN Meds:.albuterol, labetalol  Antimicrobials: Anti-infectives (From admission, onward)    None        I have personally reviewed the following labs and images: CBC: Recent Labs  Lab 05/29/22 1254 05/29/22 2057 05/30/22 0410  WBC 8.4  --  6.3  NEUTROABS 5.5  --  4.0  HGB 6.9* 7.1* 8.1*  HCT 25.2* 25.1* 28.8*  MCV 64.0*  --  65.8*  PLT 338  --  301   BMP &GFR Recent Labs  Lab 05/29/22 1254 05/29/22 2057 05/30/22 0410  NA 141  --  142  K 2.6* 3.0* 2.9*  CL 110  --  106  CO2 25  --  26  GLUCOSE 136*  --  85  BUN 9  --  9  CREATININE 1.09  --  0.98  CALCIUM 8.5*  --  8.9  MG 1.6*  --  1.9   Estimated Creatinine Clearance: 58.4 mL/min (by C-G formula based on SCr of 0.98 mg/dL). Liver & Pancreas: Recent Labs  Lab 05/30/22 0410  AST 19  ALT 11  ALKPHOS 78  BILITOT 1.7*  PROT 6.0*  ALBUMIN 3.6   No results for input(s): "LIPASE", "AMYLASE" in the last 168 hours. No results for input(s): "AMMONIA" in the last 168 hours. Diabetic: No results for input(s): "HGBA1C" in the last 72 hours. No results for input(s): "GLUCAP" in the last 168 hours. Cardiac Enzymes: No results for input(s): "CKTOTAL", "CKMB", "CKMBINDEX", "TROPONINI" in the last 168 hours. No results for input(s): "PROBNP" in the last 8760 hours. Coagulation Profile: No results for input(s): "INR", "PROTIME" in the last 168 hours. Thyroid Function Tests: No results for input(s): "TSH", "T4TOTAL", "FREET4", "T3FREE", "THYROIDAB" in the  last 72 hours. Lipid Profile: No results for input(s): "CHOL", "HDL", "LDLCALC", "TRIG", "CHOLHDL", "LDLDIRECT" in the last 72 hours. Anemia Panel: Recent Labs    05/29/22 1447 05/29/22 2057 05/30/22 0410  VITAMINB12  --   --  412  FOLATE  --  14.7  --   FERRITIN  --  5*  --   TIBC  --   --  440  IRON  --   --  33*  RETICCTPCT 0.7  --   --    Urine analysis: No results found for: "COLORURINE", "APPEARANCEUR", "LABSPEC", "PHURINE", "GLUCOSEU", "HGBUR", "BILIRUBINUR", "KETONESUR", "PROTEINUR", "UROBILINOGEN", "NITRITE", "LEUKOCYTESUR" Sepsis Labs: Invalid input(s): "PROCALCITONIN", "LACTICIDVEN"  Microbiology: No results found for this or any previous visit (from the past 240 hour(s)).  Radiology Studies: DG Chest 2 View  Result Date: 05/29/2022 CLINICAL DATA:  Shortness of breath. EXAM:  CHEST - 2 VIEW COMPARISON:  April 04, 2022. FINDINGS: Mild cardiomegaly is noted. Hyperinflation of the lungs is noted. Lungs are clear. Bony thorax is unremarkable. IMPRESSION: Hyperinflation of the lungs. No acute cardiopulmonary abnormality seen. Electronically Signed   By: Marijo Conception M.D.   On: 05/29/2022 13:24      Dorothymae Maciver T. [place]  If 7PM-7AM, please contact night-coverage [email or web address] 05/30/2022, 12:06 PM

## 2022-05-30 NOTE — Anesthesia Preprocedure Evaluation (Signed)
Anesthesia Evaluation  Patient identified by MRN, date of birth, ID band Patient awake    Reviewed: Allergy & Precautions, NPO status , Patient's Chart, lab work & pertinent test results  History of Anesthesia Complications Negative for: history of anesthetic complications  Airway Mallampati: I  TM Distance: >3 FB Neck ROM: Full    Dental  (+) Edentulous Upper, Edentulous Lower   Pulmonary COPD,  COPD inhaler, Current Smoker and Patient abstained from smoking.,    breath sounds clear to auscultation       Cardiovascular hypertension, Pt. on medications (-) angina Rhythm:Regular Rate:Normal  10/2021 ECHO: Normal LV systolic function with visual EF 50-55%. Left ventricle cavity is normal in size. Normal global wall motion. Left atrial cavity is normal in size. There is a extrinsic compression of the left atrium, mild AI, mild TR   Neuro/Psych TIA   GI/Hepatic Neg liver ROS, Heme positive stool   Endo/Other  negative endocrine ROS  Renal/GU negative Renal ROS     Musculoskeletal  (+) Arthritis ,   Abdominal   Peds  Hematology  (+) Blood dyscrasia (Hb 8.1), anemia ,   Anesthesia Other Findings   Reproductive/Obstetrics                            Anesthesia Physical Anesthesia Plan  ASA: 3  Anesthesia Plan: MAC   Post-op Pain Management: Minimal or no pain anticipated   Induction:   PONV Risk Score and Plan: 0  Airway Management Planned: Natural Airway and Nasal Cannula  Additional Equipment: None  Intra-op Plan:   Post-operative Plan:   Informed Consent: I have reviewed the patients History and Physical, chart, labs and discussed the procedure including the risks, benefits and alternatives for the proposed anesthesia with the patient or authorized representative who has indicated his/her understanding and acceptance.       Plan Discussed with: CRNA and Surgeon  Anesthesia  Plan Comments:        Anesthesia Quick Evaluation

## 2022-05-30 NOTE — H&P (View-Only) (Signed)
Gastroenterology Inpatient Follow Up    Subjective: Patient initially did not want to drink the colonoscopy preparation because he felt that it tasted bad. He did drink a little bit of prep. He has not had a BM after he started drinking the prep. Patient asked for cold water with ice to wash down the prep with. He is willing to drink more colon prep for EGD/colonoscopy tomorrow.  Objective: Vital signs in last 24 hours: Temp:  [97.3 F (36.3 C)-98.4 F (36.9 C)] 97.3 F (36.3 C) (09/15 0841) Pulse Rate:  [56-101] 75 (09/15 0841) Resp:  [16-29] 20 (09/15 0841) BP: (105-194)/(61-120) 162/86 (09/15 0841) SpO2:  [92 %-100 %] 100 % (09/15 0841) Weight:  [66.5 kg] 66.5 kg (09/14 1256)    Intake/Output from previous day: 09/14 0701 - 09/15 0700 In: 560.5 [I.V.:0.5; Blood:560] Out: -  Intake/Output this shift: Total I/O In: -  Out: 1275 [Urine:1275]  General appearance: alert Resp: no increased WOB Cardio: regular rate GI: soft, non-tender; bowel sounds normal; no masses,  no organomegaly Extremities: No edema  Lab Results: Recent Labs    05/29/22 1254 05/29/22 2057 05/30/22 0410  WBC 8.4  --  6.3  HGB 6.9* 7.1* 8.1*  HCT 25.2* 25.1* 28.8*  PLT 338  --  301   BMET Recent Labs    05/29/22 1254 05/29/22 2057 05/30/22 0410  NA 141  --  142  K 2.6* 3.0* 2.9*  CL 110  --  106  CO2 25  --  26  GLUCOSE 136*  --  85  BUN 9  --  9  CREATININE 1.09  --  0.98  CALCIUM 8.5*  --  8.9   LFT Recent Labs    05/30/22 0410  PROT 6.0*  ALBUMIN 3.6  AST 19  ALT 11  ALKPHOS 78  BILITOT 1.7*   PT/INR No results for input(s): "LABPROT", "INR" in the last 72 hours. Hepatitis Panel No results for input(s): "HEPBSAG", "HCVAB", "HEPAIGM", "HEPBIGM" in the last 72 hours. C-Diff No results for input(s): "CDIFFTOX" in the last 72 hours.  Studies/Results: DG Chest 2 View  Result Date: 05/29/2022 CLINICAL DATA:  Shortness of breath. EXAM: CHEST - 2 VIEW COMPARISON:   April 04, 2022. FINDINGS: Mild cardiomegaly is noted. Hyperinflation of the lungs is noted. Lungs are clear. Bony thorax is unremarkable. IMPRESSION: Hyperinflation of the lungs. No acute cardiopulmonary abnormality seen. Electronically Signed   By: Marijo Conception M.D.   On: 05/29/2022 13:24    Medications: I have reviewed the patient's current medications. Scheduled:  fluticasone furoate-vilanterol  1 puff Inhalation Daily   nicotine  21 mg Transdermal Daily   potassium chloride  10 mEq Oral Daily   potassium chloride  40 mEq Oral Once   Continuous:  sodium chloride     ferric gluconate (FERRLECIT) IVPB     potassium chloride     ZDG:LOVFIEPPI, labetalol  Assessment/Plan: 76 year old male with history of COPD, tobacco use, and HTN presents with symptomatic IDA. Possibly has had some dark stools. Found to have Hb of 6.9 upon arrival. With 1 U pRBC transfusion, Hb is now 8.1. Ferritin low at 5. FOBT positive. He has never had an EGD or colonoscopy in the past. Patient was originally scheduled to have EGD/colonoscopy today, but he was not able to complete the prep in time for the procedures. I went over the reasoning for the laxative preparation and the procedures with the patient again. I did inform the  patient that he would have to do additional prep until his stools are clear. The patient is agreeable to this. We will plan for EGD/colonoscopy tomorrow instead. Will order additional Moviprep for this evening in case he needs additional prep in order to achieve clear stools.   LOS: 1 day   Sharyn Creamer 05/30/2022, 9:05 AM

## 2022-05-31 ENCOUNTER — Inpatient Hospital Stay (HOSPITAL_COMMUNITY): Payer: Medicare Other | Admitting: Certified Registered Nurse Anesthetist

## 2022-05-31 ENCOUNTER — Encounter (HOSPITAL_COMMUNITY): Payer: Self-pay | Admitting: Internal Medicine

## 2022-05-31 ENCOUNTER — Other Ambulatory Visit: Payer: Self-pay | Admitting: Cardiology

## 2022-05-31 ENCOUNTER — Encounter (HOSPITAL_COMMUNITY)
Admission: EM | Disposition: A | Discharge status: Home / Self Care | Payer: Self-pay | Source: Home / Self Care | Attending: Student

## 2022-05-31 DIAGNOSIS — I77819 Aortic ectasia, unspecified site: Secondary | ICD-10-CM

## 2022-05-31 DIAGNOSIS — K2970 Gastritis, unspecified, without bleeding: Secondary | ICD-10-CM

## 2022-05-31 DIAGNOSIS — R5383 Other fatigue: Secondary | ICD-10-CM | POA: Diagnosis not present

## 2022-05-31 DIAGNOSIS — F17200 Nicotine dependence, unspecified, uncomplicated: Secondary | ICD-10-CM | POA: Diagnosis not present

## 2022-05-31 DIAGNOSIS — K922 Gastrointestinal hemorrhage, unspecified: Secondary | ICD-10-CM | POA: Diagnosis not present

## 2022-05-31 DIAGNOSIS — I10 Essential (primary) hypertension: Secondary | ICD-10-CM

## 2022-05-31 DIAGNOSIS — J449 Chronic obstructive pulmonary disease, unspecified: Secondary | ICD-10-CM | POA: Diagnosis not present

## 2022-05-31 DIAGNOSIS — D509 Iron deficiency anemia, unspecified: Secondary | ICD-10-CM

## 2022-05-31 DIAGNOSIS — F17210 Nicotine dependence, cigarettes, uncomplicated: Secondary | ICD-10-CM

## 2022-05-31 DIAGNOSIS — R410 Disorientation, unspecified: Secondary | ICD-10-CM | POA: Diagnosis not present

## 2022-05-31 DIAGNOSIS — K449 Diaphragmatic hernia without obstruction or gangrene: Secondary | ICD-10-CM

## 2022-05-31 HISTORY — PX: ESOPHAGOGASTRODUODENOSCOPY (EGD) WITH PROPOFOL: SHX5813

## 2022-05-31 LAB — RENAL FUNCTION PANEL
Albumin: 3.9 g/dL (ref 3.5–5.0)
Anion gap: 12 (ref 5–15)
BUN: 9 mg/dL (ref 8–23)
CO2: 24 mmol/L (ref 22–32)
Calcium: 9.4 mg/dL (ref 8.9–10.3)
Chloride: 105 mmol/L (ref 98–111)
Creatinine, Ser: 0.97 mg/dL (ref 0.61–1.24)
GFR, Estimated: >60 mL/min (ref >60)
Glucose, Bld: 84 mg/dL (ref 70–99)
Phosphorus: 2.1 mg/dL — ABNORMAL LOW (ref 2.5–4.6)
Potassium: 3.8 mmol/L (ref 3.5–5.1)
Sodium: 141 mmol/L (ref 135–145)

## 2022-05-31 LAB — CBC
HCT: 32 % — ABNORMAL LOW (ref 39.0–52.0)
Hemoglobin: 9.1 g/dL — ABNORMAL LOW (ref 13.0–17.0)
MCH: 18.6 pg — ABNORMAL LOW (ref 26.0–34.0)
MCHC: 28.4 g/dL — ABNORMAL LOW (ref 30.0–36.0)
MCV: 65.3 fL — ABNORMAL LOW (ref 80.0–100.0)
Platelets: 311 10*3/uL (ref 150–400)
RBC: 4.9 MIL/uL (ref 4.22–5.81)
RDW: 23.7 % — ABNORMAL HIGH (ref 11.5–15.5)
WBC: 7.9 10*3/uL (ref 4.0–10.5)
nRBC: 0 % (ref 0.0–0.2)

## 2022-05-31 LAB — MAGNESIUM: Magnesium: 1.8 mg/dL (ref 1.7–2.4)

## 2022-05-31 SURGERY — ESOPHAGOGASTRODUODENOSCOPY (EGD) WITH PROPOFOL
Anesthesia: Monitor Anesthesia Care

## 2022-05-31 MED ORDER — PROPOFOL 10 MG/ML IV BOLUS
INTRAVENOUS | Status: DC | PRN
  Administered 2022-05-31: 20 mg via INTRAVENOUS
  Administered 2022-05-31 (×2): 10 mg via INTRAVENOUS

## 2022-05-31 MED ORDER — PEG-KCL-NACL-NASULF-NA ASC-C 100 G PO SOLR
0.5000 | Freq: Once | ORAL | Status: AC
  Administered 2022-05-31: 100 g via ORAL
  Filled 2022-05-31: qty 1

## 2022-05-31 MED ORDER — PROPOFOL 500 MG/50ML IV EMUL
INTRAVENOUS | Status: DC | PRN
  Administered 2022-05-31: 100 ug/kg/min via INTRAVENOUS

## 2022-05-31 MED ORDER — LIDOCAINE 2% (20 MG/ML) 5 ML SYRINGE
INTRAMUSCULAR | Status: DC | PRN
  Administered 2022-05-31: 20 mg via INTRAVENOUS
  Administered 2022-05-31: 40 mg via INTRAVENOUS

## 2022-05-31 MED ORDER — POTASSIUM PHOSPHATES 15 MMOLE/5ML IV SOLN
15.0000 mmol | Freq: Once | INTRAVENOUS | Status: AC
  Administered 2022-05-31: 15 mmol via INTRAVENOUS
  Filled 2022-05-31: qty 5

## 2022-05-31 MED ORDER — PEG-KCL-NACL-NASULF-NA ASC-C 100 G PO SOLR
0.5000 | Freq: Once | ORAL | Status: AC
  Administered 2022-06-01: 100 g via ORAL
  Filled 2022-05-31: qty 1

## 2022-05-31 MED ORDER — PEG-KCL-NACL-NASULF-NA ASC-C 100 G PO SOLR: 1.0000 | Freq: Once | ORAL | Status: DC

## 2022-05-31 SURGICAL SUPPLY — 15 items

## 2022-05-31 NOTE — Evaluation (Signed)
Occupational Therapy Evaluation Patient Details Name: Brett Vasquez MRN: [id number] DOB: Jan 20, 1946 Today's Date: 05/31/2022   History of Present Illness Pt is a 76 y.o. male who presented 05/29/22 with increasing SOB and fatigue, admitted for symptomatic iron deficiency anemia in the setting of upper GI bleed and hypokalemia. S/p EGD 9/16. PMH: chronic iron deficiency anemia secondary to GI bleed, COPD, HTN, HLD, coronary hypokalemia, cigarette smoke   Clinical Impression   PTA, pt was living alone and was independent. Pt currently performing at Mod I level with increased time. Pt reporting no concerns for ADLs and IADLs in planning to return home. Pt demonstrating frustration when asked to perform things not his usual way; feel he is at baseline for cognition and balance. Recommend dc to home once medically stable per physician.  No further acute OT needs.      Recommendations for follow up therapy are one component of a multi-disciplinary discharge planning process, led by the attending physician.  Recommendations may be updated based on patient status, additional functional criteria and insurance authorization.   Follow Up Recommendations  No OT follow up    Assistance Recommended at Discharge PRN  Patient can return home with the following      Functional Status Assessment  Patient has had a recent decline in their functional status and demonstrates the ability to make significant improvements in function in a reasonable and predictable amount of time.  Equipment Recommendations  None recommended by OT    Recommendations for Other Services       Precautions / Restrictions Precautions Precautions: Fall Restrictions Weight Bearing Restrictions: No      Mobility Bed Mobility Overal bed mobility: Modified Independent             General bed mobility comments: Pt able to perform bed mobility aspects without assistance    Transfers Overall transfer level:  Independent Equipment used: None               General transfer comment: Able to come to stand from EOB without LOB or assistance.      Balance Overall balance assessment: Mild deficits observed, not formally tested                                         ADL either performed or assessed with clinical judgement   ADL Overall ADL's : Modified independent                                       General ADL Comments: Increased time and effort. Set in his ways and not wanting asssitance     Vision         Perception     Praxis      Pertinent Vitals/Pain Pain Assessment Pain Assessment: Faces Faces Pain Scale: No hurt Pain Intervention(s): Monitored during session     Hand Dominance     Extremity/Trunk Assessment Upper Extremity Assessment Upper Extremity Assessment: Overall WFL for tasks assessed   Lower Extremity Assessment Lower Extremity Assessment: Defer to PT evaluation   Cervical / Trunk Assessment Cervical / Trunk Assessment: Kyphotic   Communication Communication Communication: No difficulties   Cognition Arousal/Alertness: Awake/alert Behavior During Therapy: Flat affect (occasionally annoyed and defensive) Overall Cognitive Status: Within Functional Limits for tasks assessed  General Comments: Pt likely at his baseline, defensive in regards to x1 moment of concern with some instability. Pt resistive at times, but ultimately agreeable to session     General Comments  Pt reporting he hasnt had his COPD medication while - pt looking for it in his belongings. When discussing that the nurse can look into it, he stated, "I am not support to take outside medication anyway."    Exercises     Shoulder Instructions      Home Living Family/patient expects to be discharged to:: Private residence Living Arrangements: Alone Available Help at Discharge: Family;Available  PRN/intermittently (brother checks in at times) Type of Home: House Home Access: Stairs to enter [employer] of Steps: 3 (short steps)   Home Layout: One level     Bathroom Shower/Tub: Teacher, early years/pre: Standard     Home Equipment: None          Prior Functioning/Environment Prior Level of Function : Independent/Modified Independent                        OT Problem List: Decreased strength;Impaired balance (sitting and/or standing);Decreased knowledge of precautions      OT Treatment/Interventions:      OT Goals(Current goals can be found in the care plan section) Acute Rehab OT Goals Patient Stated Goal: Return home OT Goal Formulation: All assessment and education complete, DC therapy  OT Frequency:      Co-evaluation PT/OT/SLP Co-Evaluation/Treatment: Yes Reason for Co-Treatment: To address functional/ADL transfers;For patient/therapist safety PT goals addressed during session: Mobility/safety with mobility;Balance OT goals addressed during session: ADL's and self-care      AM-PAC OT "6 Clicks" Daily Activity     Outcome Measure Help from another person eating meals?: None Help from another person taking care of personal grooming?: None Help from another person toileting, which includes using toliet, bedpan, or urinal?: None Help from another person bathing (including washing, rinsing, drying)?: None Help from another person to put on and taking off regular upper body clothing?: None Help from another person to put on and taking off regular lower body clothing?: None 6 Click Score: 24   End of Session Nurse Communication: Mobility status  Activity Tolerance: Patient tolerated treatment well Patient left: in bed;with call bell/phone within reach;with bed alarm set;with nursing/sitter in room  OT Visit Diagnosis: Unsteadiness on feet (R26.81);Muscle weakness (generalized) (M62.81)                Time: 8563-1497 OT Time  Calculation (min): 13 min Charges:  OT General Charges $OT Visit: 1 Visit OT Evaluation $OT Eval Low Complexity: 1 Low  Demtrius Rounds MSOT, OTR/L Acute Rehab Office: [place] 05/31/2022, 1:13 PM

## 2022-05-31 NOTE — Anesthesia Postprocedure Evaluation (Signed)
Anesthesia Post Note  Patient: MERT DIETRICK  Procedure(s) Performed: ESOPHAGOGASTRODUODENOSCOPY (EGD) WITH PROPOFOL     Patient location during evaluation: PACU Anesthesia Type: MAC Level of consciousness: awake and alert, patient cooperative and oriented Pain management: pain level controlled Vital Signs Assessment: post-procedure vital signs reviewed and stable Respiratory status: spontaneous breathing, nonlabored ventilation and respiratory function stable Cardiovascular status: blood pressure returned to baseline and stable Postop Assessment: no apparent nausea or vomiting Anesthetic complications: no   No notable events documented.  Last Vitals:  Vitals:   05/31/22 0845 05/31/22 0936  BP: 129/82 (!) 138/91  Pulse: 81 85  Resp: (!) 21 16  Temp: (!) 36.2 C 36.7 C  SpO2: 96% 96%    Last Pain:  Vitals:   05/31/22 0936  TempSrc: Oral  PainSc:                  Royalti Schauf,E. Kendrick Remigio

## 2022-05-31 NOTE — Anesthesia Procedure Notes (Signed)
Procedure Name: MAC Date/Time: 05/31/2022 8:14 AM  Performed by: Dorthea Cove, CRNAPre-anesthesia Checklist: Patient identified, Emergency Drugs available, Suction available, Patient being monitored and Timeout performed Patient Re-evaluated:Patient Re-evaluated prior to induction Oxygen Delivery Method: Nasal cannula Preoxygenation: Pre-oxygenation with 100% oxygen Induction Type: IV induction Airway Equipment and Method: Bite block Placement Confirmation: positive ETCO2 and CO2 detector Dental Injury: Teeth and Oropharynx as per pre-operative assessment

## 2022-05-31 NOTE — Progress Notes (Signed)
Patient returned to room and RN at bedside.  Pt hooked back to tele box.  Informed RN that patient has a large amount of money hidden in his left sock.

## 2022-05-31 NOTE — Progress Notes (Signed)
Report given to Endo staff, regarding possible refusal of procedure, as of now pt agreeable to endoscopy and IV placement. However remains very irritable and adamant that he is leaving. Endo staff also made aware that pt unable to complete bowel prep for possible colonoscopy.

## 2022-05-31 NOTE — Transfer of Care (Signed)
Immediate Anesthesia Transfer of Care Note  Patient: Brett Vasquez  Procedure(s) Performed: ESOPHAGOGASTRODUODENOSCOPY (EGD) WITH PROPOFOL  Patient Location: PACU  Anesthesia Type:MAC  Level of Consciousness: drowsy and patient cooperative  Airway & Oxygen Therapy: Patient Spontanous Breathing and Patient connected to nasal cannula oxygen  Post-op Assessment: Report given to RN and Post -op Vital signs reviewed and stable  Post vital signs: Reviewed and stable  Last Vitals:  Vitals Value Taken Time  BP 117/97 05/31/22 0834  Temp    Pulse 79 05/31/22 0835  Resp 23 05/31/22 0835  SpO2 99 % 05/31/22 0835  Vitals shown include unvalidated device data.  Last Pain:  Vitals:   05/31/22 0730  TempSrc: Temporal  PainSc: 0-No pain         Complications: No notable events documented.

## 2022-05-31 NOTE — Op Note (Signed)
[facility] Patient Name: Brett Vasquez Procedure Date : 05/31/2022 MRN: [id number] Attending MD: Georgian Co ,  Date of Birth: 03/18/1946 CSN: [id number] Age: 76 Admit Type: Inpatient Procedure:                Upper GI endoscopy Indications:              Iron deficiency anemia, possible melena Providers:                Adline Mango" Carmin Richmond, RN,                            William Dalton, Technician Referring MD:             Hospitalist team Medicines:                Monitored Anesthesia Care Complications:            No immediate complications. Estimated Blood Loss:     Estimated blood loss: none. Procedure:                Pre-Anesthesia Assessment:                           - Prior to the procedure, a History and Physical                            was performed, and patient medications and                            allergies were reviewed. The patient's tolerance of                            previous anesthesia was also reviewed. The risks                            and benefits of the procedure and the sedation                            options and risks were discussed with the patient.                            All questions were answered, and informed consent                            was obtained. Prior Anticoagulants: The patient has                            taken no previous anticoagulant or antiplatelet                            agents. ASA Grade Assessment: III - A patient with                            severe systemic disease. After reviewing the risks  and benefits, the patient was deemed in                            satisfactory condition to undergo the procedure.                           After obtaining informed consent, the endoscope was                            passed under direct vision. Throughout the                            procedure, the patient's blood pressure, pulse, and                             oxygen saturations were monitored continuously. The                            GIF-H190 ([id number] Olympus endoscope was introduced                            through the mouth, and advanced to the second part                            of duodenum. The upper GI endoscopy was                            accomplished without difficulty. The patient                            tolerated the procedure well. Scope In: Scope Out: Findings:      The examined esophagus was normal.      A hiatal hernia was present.      Localized inflammation characterized by congestion (edema), erosions and       erythema was found in the gastric body. Biopsies were taken with a cold       forceps for histology.      The examined duodenum was normal. Impression:               - Normal esophagus.                           - Hiatal hernia.                           - Gastritis. Biopsied.                           - Normal examined duodenum. Recommendation:           - Return patient to hospital ward for ongoing care.                           - Use a proton pump inhibitor PO BID for 8 weeks.                           -  Check serum H pylori antibody. Treat if positive.                           - Continue daily iron supplementation.                           - Patient was not adequately cleaned out for a                            colonoscopy today (brown stool was visualized by                            the nurses). Will discuss whether or not the                            patient wishes to drink more prep in order to                            undergo a colonoscopy tomorrow. Procedure Code(s):        --- Professional ---                           [phone], Esophagogastroduodenoscopy, flexible,                            transoral; with biopsy, single or multiple Diagnosis Code(s):        --- Professional ---                           K44.9, Diaphragmatic hernia without obstruction or                             gangrene                           K29.70, Gastritis, unspecified, without bleeding                           D50.9, Iron deficiency anemia, unspecified CPT copyright 2019 American Medical Association. All rights reserved. The codes documented in this report are preliminary and upon coder review may  be revised to meet current compliance requirements. Dr Georgian Co "[name]" Lorenso Courier,  05/31/2022 8:45:09 AM Number of Addenda: 1 Addendum Number: 1   Addendum Date: 06/02/2022 10:35:50 AM      Gastric biopsies were not taken. Serum H pylori antibody was checked       instead. Dr Georgian Co "[place]" [place],  06/02/2022 10:36:44 AM

## 2022-05-31 NOTE — Progress Notes (Signed)
PROGRESS NOTE  Brett Vasquez ZOX:[id number] DOB: 12-15-1945   PCP: Susy Frizzle, MD  Patient is from: Home.  Lives with brother.  Independently ambulates at baseline.  DOA: 05/29/2022 LOS: 2  Chief complaints Chief Complaint  Patient presents with   Weakness     Brief Narrative / Interim history: 76 year old M with PMH of IDA/GIB, COPD, HTN, HLD, hypokalemia and tobacco use disorder presenting with progressive shortness of breath, fatigue and epigastric discomfort for over 2 weeks, and admitted for symptomatic iron deficiency anemia in the setting of upper GI bleed, and hypokalemia.  Patient admits to using [facility] powder occasionally.  Hgb 6.9 from 7.6 about 3 weeks ago.  Hemoccult positive.  1 unit of blood ordered.  GI consulted.   Hgb improved to 8.1 after 1 unit.  EGD showed hiatal hernia and gastritis.  GI recommended PPI twice daily for 8 weeks.  Colonoscopy postponed due to poor prep.  Plan for colonoscopy on 9/17.   Subjective: Seen and examined earlier this morning after he returned from EGD.  He is a sleepy but wakes to voice.  No complaints but likes to catch some sleep.  He denies pain.  He is aware of the plan for colonoscopy tomorrow.  Objective: Vitals:   05/31/22 0730 05/31/22 0833 05/31/22 0845 05/31/22 0936  BP: (!) 165/90 (!) 117/97 129/82 (!) 138/91  Pulse: 98 70 81 85  Resp: 18 19 (!) 21 16  Temp:  (!) 97.1 F (36.2 C) (!) 97.1 F (36.2 C) 98.1 F (36.7 C)  TempSrc: Temporal   Oral  SpO2: 97% 98% 96% 96%  Weight: 66.5 kg     Height: _0  (1.676 m)       Examination:  GENERAL: No apparent distress.  Nontoxic. HEENT: MMM.  Vision and hearing grossly intact.  NECK: Supple.  No apparent JVD.  RESP:  No IWOB.  Fair aeration bilaterally. CVS:  RRR. Heart sounds normal.  ABD/GI/GU: BS+. Abd soft, NTND.  MSK/EXT:  Moves extremities. No apparent deformity. No edema.  SKIN: no apparent skin lesion or wound NEURO: Sleepy but wakes to voice.  Oriented  appropriately.  No apparent focal neuro deficit. PSYCH: Calm. Normal affect.   Procedures:  9/16-EGD with gastritis and hiatal hernia.  Microbiology summarized: None  Assessment and plan: Principal Problem:   GI bleed Active Problems:   Tobacco use disorder   Hypertensive disorder   Symptomatic anemia   Iron deficiency anemia   Chronic obstructive lung disease (HCC)   Hypokalemia   Delirium   Fatigue   Shortness of breath  Symptomatic iron deficiency anemia likely due to upper GI bleed: denies melena, hematochezia or hematemesis.  Denies hematuria either.  Hemoccult positive.  Anemia panel with severe iron deficiency.  Admits to using BC powder.  Transfused 1 unit with appropriate response.  EGD as above.  H&H stable. Recent Labs    04/04/22 1607 04/05/22 0638 05/29/22 1254 05/29/22 2057 05/30/22 0410 05/31/22 0031  HGB 5.5* 7.6* 6.9* 7.1* 8.1* 9.1*  -GI following-colonoscopy postponed to 9/17 due to poor prep -Monitor H&H.  Transfuse for Hgb <7.0 or symptoms. -IV ferric gluconate 250 mg daily x2 doses -Advised to refrain from NSAID use -GI recommended PPI twice daily for 8 weeks on discharge -Clear liquid diet, and n.p.o. after midnight per GI  Hypokalemia hypophosphatemia: Likely due to HCTZ and ACE inhibitor's. -Monitor replenish as appropriate  Chronic COPD/tobacco use disorder: Stable.  Smokes about a pack a day.  About 60-pack-year  history.  Not motivated to stop. -Nicotine patch  Essential hypertension: BP slightly elevated but improved. -Amlodipine 10 mg daily -IV labetalol as needed  Shortness of breath/fatigue: Likely due to anemia and underlying COPD -Manage anemia as above -PT/OT eval  Hospital delirium: Might have underlying cognitive impairment although no history of this -Reorientation and delirium precautions -Minimize sedating medications -Fall precaution    Body mass index is 23.66 kg/m.           DVT prophylaxis:  SCDs Start:  05/29/22 1828  Code Status: Full code Family Communication: None at bedside Level of care: Telemetry Medical Status is: Inpatient Remains inpatient appropriate because: GI evaluation for symptomatic severe iron deficiency anemia   Final disposition: Likely home once medically cleared Consultants:  GI  Sch Meds:  Scheduled Meds:  amLODipine  10 mg Oral Daily   fluticasone furoate-vilanterol  1 puff Inhalation Daily   nicotine  21 mg Transdermal Daily   peg 3350 powder  0.5 kit Oral Once   And   [START ON 06/01/2022] peg 3350 powder  0.5 kit Oral Once   Continuous Infusions:   PRN Meds:.albuterol, labetalol  Antimicrobials: Anti-infectives (From admission, onward)    None        I have personally reviewed the following labs and images: CBC: Recent Labs  Lab 05/29/22 1254 05/29/22 2057 05/30/22 0410 05/31/22 0031  WBC 8.4  --  6.3 7.9  NEUTROABS 5.5  --  4.0  --   HGB 6.9* 7.1* 8.1* 9.1*  HCT 25.2* 25.1* 28.8* 32.0*  MCV 64.0*  --  65.8* 65.3*  PLT 338  --  301 311   BMP &GFR Recent Labs  Lab 05/29/22 1254 05/29/22 2057 05/30/22 0410 05/31/22 0031  NA 141  --  142 141  K 2.6* 3.0* 2.9* 3.8  CL 110  --  106 105  CO2 25  --  26 24  GLUCOSE 136*  --  85 84  BUN 9  --  9 9  CREATININE 1.09  --  0.98 0.97  CALCIUM 8.5*  --  8.9 9.4  MG 1.6*  --  1.9 1.8  PHOS  --   --   --  2.1*   Estimated Creatinine Clearance: 56.6 mL/min (by C-G formula based on SCr of 0.97 mg/dL). Liver & Pancreas: Recent Labs  Lab 05/30/22 0410 05/31/22 0031  AST 19  --   ALT 11  --   ALKPHOS 78  --   BILITOT 1.7*  --   PROT 6.0*  --   ALBUMIN 3.6 3.9   No results for input(s): "LIPASE", "AMYLASE" in the last 168 hours. No results for input(s): "AMMONIA" in the last 168 hours. Diabetic: No results for input(s): "HGBA1C" in the last 72 hours. No results for input(s): "GLUCAP" in the last 168 hours. Cardiac Enzymes: No results for input(s): "CKTOTAL", "CKMB",  "CKMBINDEX", "TROPONINI" in the last 168 hours. No results for input(s): "PROBNP" in the last 8760 hours. Coagulation Profile: No results for input(s): "INR", "PROTIME" in the last 168 hours. Thyroid Function Tests: No results for input(s): "TSH", "T4TOTAL", "FREET4", "T3FREE", "THYROIDAB" in the last 72 hours. Lipid Profile: No results for input(s): "CHOL", "HDL", "LDLCALC", "TRIG", "CHOLHDL", "LDLDIRECT" in the last 72 hours. Anemia Panel: Recent Labs    05/29/22 1447 05/29/22 2057 05/30/22 0410  VITAMINB12  --   --  412  FOLATE  --  14.7  --   FERRITIN  --  5*  --   TIBC  --   --  440  IRON  --   --  33*  RETICCTPCT 0.7  --   --    Urine analysis: No results found for: "COLORURINE", "APPEARANCEUR", "LABSPEC", "PHURINE", "GLUCOSEU", "HGBUR", "BILIRUBINUR", "KETONESUR", "PROTEINUR", "UROBILINOGEN", "NITRITE", "LEUKOCYTESUR" Sepsis Labs: Invalid input(s): "PROCALCITONIN", "LACTICIDVEN"  Microbiology: No results found for this or any previous visit (from the past 240 hour(s)).  Radiology Studies: No results found.    Brett Vasquez T. Bowie  If 7PM-7AM, please contact night-coverage [email or web address] 05/31/2022, 1:09 PM

## 2022-05-31 NOTE — Interval H&P Note (Signed)
History and Physical Interval Note:  05/31/2022 8:04 AM  Brett Vasquez  has presented today for surgery, with the diagnosis of Anemia, hemoccult postive stools.  The various methods of treatment have been discussed with the patient and family. After consideration of risks, benefits and other options for treatment, the patient has consented to  Procedure(s): ESOPHAGOGASTRODUODENOSCOPY (EGD) WITH PROPOFOL (N/A) as a surgical intervention.  The patient's history has been reviewed, patient examined, no change in status, stable for surgery.  I have reviewed the patient's chart and labs.  Questions were answered to the patient's satisfaction.     Sharyn Creamer

## 2022-05-31 NOTE — Progress Notes (Signed)
Pt off of unit for endoscopy.

## 2022-05-31 NOTE — Progress Notes (Signed)
OT Cancellation Note  Patient Details Name: Brett Vasquez MRN: [id number] DOB: [birth year]   Cancelled Treatment:    Reason Eval/Treat Not Completed: Patient at procedure or test/ unavailable (ENDO. Will return as schedule allows.)  [place], OTR/L Acute Rehab Office: [phone] 05/31/2022, 8:22 AM

## 2022-05-31 NOTE — Progress Notes (Signed)
Pt consumed only first half of bowel prep, despite numerous attempts. Pt never observed drinking fluid however when this nurse comes to check for compliance, cup is empty at bedside. After first dose, pt states "that's it you guys are making me sick!" Pt has pulled out IV and is threatening to leave. No bowel movements noted by staff, pt stated he had a bowel movement however not confirmed by staff. Irritable upon approach, requiring redirection of staff, demanding that we "call a cab!". Resting at present in bed after discussion with this nurse. Pt encouraged to wait until doctor rounds this am for further information, pt agrees at this time.

## 2022-05-31 NOTE — Progress Notes (Signed)
PT Cancellation Note  Patient Details Name: Brett Vasquez MRN: [id number] DOB: 1946/07/31   Cancelled Treatment:    Reason Eval/Treat Not Completed: Patient at procedure or test/unavailable. Will plan to follow-up later as time permits.   Moishe Spice, PT, DPT Acute Rehabilitation Services  Office: [place] 05/31/2022, 8:23 AM

## 2022-05-31 NOTE — Evaluation (Signed)
Physical Therapy Evaluation & Discharge Patient Details Name: Brett Vasquez MRN: [id number] DOB: 07/22/1946 Today's Date: 05/31/2022  History of Present Illness  Pt is a 76 y.o. male who presented 05/29/22 with increasing SOB and fatigue, admitted for symptomatic iron deficiency anemia in the setting of upper GI bleed and hypokalemia. S/p EGD 9/16. PMH: chronic iron deficiency anemia secondary to GI bleed, COPD, HTN, HLD, coronary hypokalemia, cigarette smoke   Clinical Impression  Pt presents with condition above. Pt can be easily annoyed and can become resistive at times, but ultimately was agreeable to session. Pt appears to be and reports to be at his baseline, performing all functional mobility without UE support or assistance. He does display some mild balance deficits and endurance deficits, but likely is his baseline per his report. All education completed and questions answered. PT will sign off.       Recommendations for follow up therapy are one component of a multi-disciplinary discharge planning process, led by the attending physician.  Recommendations may be updated based on patient status, additional functional criteria and insurance authorization.  Follow Up Recommendations No PT follow up      Assistance Recommended at Discharge PRN  Patient can return home with the following       Equipment Recommendations None recommended by PT  Recommendations for Other Services       Functional Status Assessment Patient has not had a recent decline in their functional status     Precautions / Restrictions Precautions Precautions: Fall Restrictions Weight Bearing Restrictions: No      Mobility  Bed Mobility Overal bed mobility: Modified Independent             General bed mobility comments: Pt able to perform bed mobility aspects without assistance    Transfers Overall transfer level: Independent Equipment used: None               General transfer comment:  Able to come to stand from EOB without LOB or assistance.    Ambulation/Gait Ambulation/Gait assistance: Min guard Gait Distance (Feet): 175 Feet Assistive device: None Gait Pattern/deviations: Step-through pattern, Decreased stride length, Narrow base of support Gait velocity: WFL Gait velocity interpretation: >2.62 ft/sec, indicative of community ambulatory   General Gait Details: Pt with x1 minor LOB when turning to R to exit room, but able to recover without assistance. Min guard for safety. Intermittent narrow BOS noted. Pt fatigues from his COPD. Educated him on pursed lip breathing.  Stairs            Wheelchair Mobility    Modified Rankin (Stroke Patients Only)       Balance Overall balance assessment: Mild deficits observed, not formally tested                                           Pertinent Vitals/Pain Pain Assessment Pain Assessment: Faces Faces Pain Scale: No hurt Pain Intervention(s): Monitored during session    Home Living Family/patient expects to be discharged to:: Private residence Living Arrangements: Alone Available Help at Discharge: Family;Available PRN/intermittently (brother checks in at times) Type of Home: House Home Access: Stairs to enter   [employer] of Steps: 3 (short steps)   Home Layout: One level Home Equipment: None      Prior Function Prior Level of Function : Independent/Modified Independent  Hand Dominance        Extremity/Trunk Assessment   Upper Extremity Assessment Upper Extremity Assessment: Defer to OT evaluation    Lower Extremity Assessment Lower Extremity Assessment: Overall WFL for tasks assessed    Cervical / Trunk Assessment Cervical / Trunk Assessment: Kyphotic  Communication   Communication: No difficulties  Cognition Arousal/Alertness: Awake/alert Behavior During Therapy: Flat affect (occasionally annoyed and defensive) Overall  Cognitive Status: Within Functional Limits for tasks assessed                                 General Comments: Pt likely at his baseline, defensive in regards to x1 moment of concern with some instability. Pt resistive at times, but ultimately agreeable to session        General Comments General comments (skin integrity, edema, etc.): pt reports he is at his baseline; educated him to have nursing assist him OOB when hooked up to IV for his safety    Exercises     Assessment/Plan    PT Assessment Patient does not need any further PT services  PT Problem List Decreased balance;Decreased mobility       PT Treatment Interventions DME instruction;Gait training;Functional mobility training;Stair training;Therapeutic activities;Therapeutic exercise;Balance training;Patient/family education;Neuromuscular re-education    PT Goals (Current goals can be found in the Care Plan section)  Acute Rehab PT Goals Patient Stated Goal: to go home PT Goal Formulation: All assessment and education complete, DC therapy Time For Goal Achievement: 06/01/22 Potential to Achieve Goals: Good    Frequency       Co-evaluation PT/OT/SLP Co-Evaluation/Treatment: Yes Reason for Co-Treatment: For patient/therapist safety;To address functional/ADL transfers;Other (comment) (per chart, pt agitated at times and likely would not tolerate 2 sessions) PT goals addressed during session: Mobility/safety with mobility;Balance         AM-PAC PT "6 Clicks" Mobility  Outcome Measure Help needed turning from your back to your side while in a flat bed without using bedrails?: None Help needed moving from lying on your back to sitting on the side of a flat bed without using bedrails?: None Help needed moving to and from a bed to a chair (including a wheelchair)?: None Help needed standing up from a chair using your arms (e.g., wheelchair or bedside chair)?: None Help needed to walk in hospital room?: A  Little Help needed climbing 3-5 steps with a railing? : A Little 6 Click Score: 22    End of Session   Activity Tolerance: Patient tolerated treatment well Patient left: in bed;with call bell/phone within reach;with bed alarm set Nurse Communication: Mobility status PT Visit Diagnosis: Unsteadiness on feet (R26.81);Other abnormalities of gait and mobility (R26.89)    Time: 5621-3086 PT Time Calculation (min) (ACUTE ONLY): 10 min   Charges:   PT Evaluation $PT Eval Low Complexity: 1 Low          Moishe Spice, PT, DPT Acute Rehabilitation Services  Office: [phone]   Orvan Falconer 05/31/2022, 1:02 PM

## 2022-06-01 ENCOUNTER — Encounter (HOSPITAL_COMMUNITY): Payer: Self-pay | Admitting: Internal Medicine

## 2022-06-01 ENCOUNTER — Inpatient Hospital Stay (HOSPITAL_COMMUNITY): Payer: Medicare Other | Admitting: Anesthesiology

## 2022-06-01 ENCOUNTER — Encounter (HOSPITAL_COMMUNITY)
Admission: EM | Disposition: A | Discharge status: Home / Self Care | Payer: Self-pay | Source: Home / Self Care | Attending: Student

## 2022-06-01 DIAGNOSIS — K922 Gastrointestinal hemorrhage, unspecified: Secondary | ICD-10-CM | POA: Diagnosis not present

## 2022-06-01 DIAGNOSIS — K5730 Diverticulosis of large intestine without perforation or abscess without bleeding: Secondary | ICD-10-CM

## 2022-06-01 DIAGNOSIS — F17210 Nicotine dependence, cigarettes, uncomplicated: Secondary | ICD-10-CM

## 2022-06-01 DIAGNOSIS — R5383 Other fatigue: Secondary | ICD-10-CM | POA: Diagnosis not present

## 2022-06-01 DIAGNOSIS — K635 Polyp of colon: Secondary | ICD-10-CM

## 2022-06-01 DIAGNOSIS — K648 Other hemorrhoids: Secondary | ICD-10-CM

## 2022-06-01 DIAGNOSIS — R410 Disorientation, unspecified: Secondary | ICD-10-CM | POA: Diagnosis not present

## 2022-06-01 DIAGNOSIS — J449 Chronic obstructive pulmonary disease, unspecified: Secondary | ICD-10-CM | POA: Diagnosis not present

## 2022-06-01 DIAGNOSIS — D509 Iron deficiency anemia, unspecified: Secondary | ICD-10-CM

## 2022-06-01 DIAGNOSIS — D123 Benign neoplasm of transverse colon: Secondary | ICD-10-CM

## 2022-06-01 HISTORY — PX: COLONOSCOPY WITH PROPOFOL: SHX5780

## 2022-06-01 HISTORY — PX: POLYPECTOMY: SHX5525

## 2022-06-01 LAB — CBC
HCT: 30.5 % — ABNORMAL LOW (ref 39.0–52.0)
Hemoglobin: 8.4 g/dL — ABNORMAL LOW (ref 13.0–17.0)
MCH: 18.1 pg — ABNORMAL LOW (ref 26.0–34.0)
MCHC: 27.5 g/dL — ABNORMAL LOW (ref 30.0–36.0)
MCV: 65.9 fL — ABNORMAL LOW (ref 80.0–100.0)
Platelets: 311 10*3/uL (ref 150–400)
RBC: 4.63 MIL/uL (ref 4.22–5.81)
RDW: 23.9 % — ABNORMAL HIGH (ref 11.5–15.5)
WBC: 5.7 10*3/uL (ref 4.0–10.5)
nRBC: 0 % (ref 0.0–0.2)

## 2022-06-01 LAB — RENAL FUNCTION PANEL
Albumin: 3.5 g/dL (ref 3.5–5.0)
Anion gap: 11 (ref 5–15)
BUN: 10 mg/dL (ref 8–23)
CO2: 25 mmol/L (ref 22–32)
Calcium: 9 mg/dL (ref 8.9–10.3)
Chloride: 106 mmol/L (ref 98–111)
Creatinine, Ser: 1.1 mg/dL (ref 0.61–1.24)
GFR, Estimated: >60 mL/min (ref >60)
Glucose, Bld: 89 mg/dL (ref 70–99)
Phosphorus: 3.1 mg/dL (ref 2.5–4.6)
Potassium: 3.1 mmol/L — ABNORMAL LOW (ref 3.5–5.1)
Sodium: 142 mmol/L (ref 135–145)

## 2022-06-01 LAB — MAGNESIUM: Magnesium: 1.7 mg/dL (ref 1.7–2.4)

## 2022-06-01 SURGERY — COLONOSCOPY WITH PROPOFOL
Anesthesia: Monitor Anesthesia Care

## 2022-06-01 MED ORDER — FERROUS SULFATE 325 (65 FE) MG PO TBEC: 325.0000 mg | DELAYED_RELEASE_TABLET | Freq: Two times a day (BID) | ORAL | 1 refills | Status: DC

## 2022-06-01 MED ORDER — POLYETHYLENE GLYCOL 3350 17 GM/SCOOP PO POWD: 17.0000 g | Freq: Two times a day (BID) | ORAL | 0 refills | Status: DC | PRN

## 2022-06-01 MED ORDER — POTASSIUM CHLORIDE 10 MEQ/100ML IV SOLN
10.0000 meq | INTRAVENOUS | Status: DC
  Administered 2022-06-01 (×4): 10 meq via INTRAVENOUS
  Filled 2022-06-01 (×4): qty 100

## 2022-06-01 MED ORDER — OXYCODONE HCL 5 MG/5ML PO SOLN: 5.0000 mg | Freq: Once | ORAL | Status: DC | PRN

## 2022-06-01 MED ORDER — FENTANYL CITRATE (PF) 100 MCG/2ML IJ SOLN: 25.0000 ug | INTRAMUSCULAR | Status: DC | PRN

## 2022-06-01 MED ORDER — ONDANSETRON HCL 4 MG/2ML IJ SOLN: 4.0000 mg | Freq: Four times a day (QID) | INTRAMUSCULAR | Status: DC | PRN

## 2022-06-01 MED ORDER — PANTOPRAZOLE SODIUM 40 MG PO TBEC
40.0000 mg | DELAYED_RELEASE_TABLET | Freq: Two times a day (BID) | ORAL | Status: DC
  Administered 2022-06-01: 40 mg via ORAL
  Filled 2022-06-01: qty 1

## 2022-06-01 MED ORDER — LIDOCAINE 2% (20 MG/ML) 5 ML SYRINGE
INTRAMUSCULAR | Status: DC | PRN
  Administered 2022-06-01: 40 mg via INTRAVENOUS

## 2022-06-01 MED ORDER — OXYCODONE HCL 5 MG PO TABS: 5.0000 mg | ORAL_TABLET | Freq: Once | ORAL | Status: DC | PRN

## 2022-06-01 MED ORDER — PROPOFOL 500 MG/50ML IV EMUL
INTRAVENOUS | Status: DC | PRN
  Administered 2022-06-01: 100 ug/kg/min via INTRAVENOUS

## 2022-06-01 MED ORDER — SODIUM CHLORIDE 0.9 % IV SOLN: INTRAVENOUS | Status: DC

## 2022-06-01 MED ORDER — PANTOPRAZOLE SODIUM 40 MG PO TBEC: DELAYED_RELEASE_TABLET | ORAL | 0 refills | Status: DC

## 2022-06-01 MED ORDER — ACETAMINOPHEN 500 MG PO TABS: 1000.0000 mg | ORAL_TABLET | Freq: Three times a day (TID) | ORAL | 0 refills | Status: DC | PRN

## 2022-06-01 MED ORDER — SENNOSIDES-DOCUSATE SODIUM 8.6-50 MG PO TABS: 1.0000 | ORAL_TABLET | Freq: Two times a day (BID) | ORAL | 0 refills | Status: DC | PRN

## 2022-06-01 MED ORDER — LACTATED RINGERS IV SOLN: INTRAVENOUS | Status: DC | PRN

## 2022-06-01 MED ORDER — POTASSIUM CHLORIDE CRYS ER 20 MEQ PO TBCR
40.0000 meq | EXTENDED_RELEASE_TABLET | Freq: Once | ORAL | Status: AC
  Administered 2022-06-01: 40 meq via ORAL
  Filled 2022-06-01: qty 2

## 2022-06-01 MED ORDER — PROPOFOL 10 MG/ML IV BOLUS
INTRAVENOUS | Status: DC | PRN
  Administered 2022-06-01: 30 mg via INTRAVENOUS

## 2022-06-01 SURGICAL SUPPLY — 22 items

## 2022-06-01 NOTE — Anesthesia Postprocedure Evaluation (Signed)
Anesthesia Post Note  Patient: Brett Vasquez  Procedure(s) Performed: COLONOSCOPY WITH PROPOFOL POLYPECTOMY     Patient location during evaluation: PACU Anesthesia Type: MAC Level of consciousness: awake and alert Pain management: pain level controlled Vital Signs Assessment: post-procedure vital signs reviewed and stable Respiratory status: spontaneous breathing, nonlabored ventilation, respiratory function stable and patient connected to nasal cannula oxygen Cardiovascular status: stable and blood pressure returned to baseline Postop Assessment: no apparent nausea or vomiting Anesthetic complications: no   No notable events documented.  Last Vitals:  Vitals:   06/01/22 1335 06/01/22 1356  BP: 128/88 128/79  Pulse: 68 84  Resp: 18 18  Temp: 36.5 C 36.6 C  SpO2: 97% 99%    Last Pain:  Vitals:   06/01/22 1356  TempSrc: Oral  PainSc: 0-No pain                 Chardonay Scritchfield S

## 2022-06-01 NOTE — Transfer of Care (Signed)
Immediate Anesthesia Transfer of Care Note  Patient: Brett Vasquez  Procedure(s) Performed: COLONOSCOPY WITH PROPOFOL POLYPECTOMY  Patient Location: PACU  Anesthesia Type:MAC  Level of Consciousness: awake and alert   Airway & Oxygen Therapy: Patient Spontanous Breathing and Patient connected to nasal cannula oxygen  Post-op Assessment: Report given to RN and Post -op Vital signs reviewed and stable  Post vital signs: Reviewed and stable  Last Vitals:  Vitals Value Taken Time  BP 113/75 06/01/22 1317  Temp    Pulse 72   Resp 19 06/01/22 1319  SpO2 97%   Vitals shown include unvalidated device data.  Last Pain:  Vitals:   06/01/22 1222  TempSrc:   PainSc: 0-No pain         Complications: No notable events documented.

## 2022-06-01 NOTE — Progress Notes (Signed)
Patient given discharge instructions and stated understanding. 

## 2022-06-01 NOTE — Progress Notes (Signed)
Pt would only drink 1 cup of Moviprep at 4am..

## 2022-06-01 NOTE — Anesthesia Preprocedure Evaluation (Signed)
Anesthesia Evaluation  Patient identified by MRN, date of birth, ID band Patient awake    Reviewed: Allergy & Precautions, H&P , NPO status , Patient's Chart, lab work & pertinent test results  Airway Mallampati: II   Neck ROM: full    Dental   Pulmonary COPD, Current Smoker and Patient abstained from smoking.,    breath sounds clear to auscultation       Cardiovascular hypertension,  Rhythm:regular Rate:Normal     Neuro/Psych Seizures -,  TIA   GI/Hepatic GI bleeding   Endo/Other    Renal/GU      Musculoskeletal  (+) Arthritis ,   Abdominal   Peds  Hematology  (+) Blood dyscrasia, anemia ,   Anesthesia Other Findings   Reproductive/Obstetrics                             Anesthesia Physical Anesthesia Plan  ASA: 3  Anesthesia Plan: MAC   Post-op Pain Management:    Induction: Intravenous  PONV Risk Score and Plan: 0 and Propofol infusion and Treatment may vary due to age or medical condition  Airway Management Planned: Natural Airway and Nasal Cannula  Additional Equipment:   Intra-op Plan:   Post-operative Plan:   Informed Consent: I have reviewed the patients History and Physical, chart, labs and discussed the procedure including the risks, benefits and alternatives for the proposed anesthesia with the patient or authorized representative who has indicated his/her understanding and acceptance.     Dental advisory given  Plan Discussed with: CRNA, Anesthesiologist and Surgeon  Anesthesia Plan Comments:         Anesthesia Quick Evaluation

## 2022-06-01 NOTE — Discharge Summary (Signed)
Physician Discharge Summary  MCARTHUR IVINS XBM:[id number] DOB: Jan 06, 1946 DOA: 05/29/2022  PCP: Susy Frizzle, MD  Admit date: 05/29/2022 Discharge date: 06/01/2022 Admitted From: Home Disposition: Home Recommendations for Outpatient Follow-up:  Follow up with PCP in 1 to 2 weeks Check BMP and CBC in 1 to 2 weeks Please follow up on the following pending results: Pathology from colon polyps and H. pylori testing  Home Health: None indicated Equipment/Devices: None indicated  Discharge Condition: Stable CODE STATUS: Full code  Follow-up Information     Susy Frizzle, MD. Schedule an appointment as soon as possible for a visit in 1 week(s).   Specialty: Family Medicine Contact information: [address] [place] [address] [phone]                 Hospital course 76 year old M with PMH of IDA/GIB, COPD, HTN, HLD, hypokalemia and tobacco use disorder presenting with progressive shortness of breath, fatigue and epigastric discomfort for over 2 weeks, and admitted for symptomatic iron deficiency anemia in the setting of upper GI bleed, and hypokalemia.  Patient admits to using [facility] powder occasionally.  Hgb 6.9 from 7.6 about 3 weeks ago.  Hemoccult positive.  1 unit of blood ordered.  GI consulted.    Hgb improved to 8.1 after 1 unit.  Also received IV ferric gluconate 250 mg x 2 for iron deficiency.  EGD on 9/16 showed hiatal hernia and gastritis.  GI recommended PPI twice daily for 8 weeks.  Colonoscopy limited by poor prep on 9/17 without significant finding other than one 2 mm polyp in transverse colon (biopsied), sigmoid diverticulosis and nonbleeding internal hemorrhoid.   Patient's hemoglobin remained stable.  No signs of bleeding throughout his stay.  He was cleared for discharge by gastroenterology and therapy.  He is discharged on p.o. Protonix 40 mg twice daily and p.o. iron.  Advised to avoid NSAID.   See individual problem list below for more.    Problems addressed during this hospitalization Principal Problem:   GI bleed Active Problems:   Tobacco use disorder   Hypertensive disorder   Symptomatic anemia   Iron deficiency anemia   Chronic obstructive lung disease (HCC)   Hypokalemia   Delirium   Fatigue   Shortness of breath   Symptomatic iron deficiency anemia likely due to upper GI bleed: denies melena, hematochezia or hematemesis.  Denies hematuria either.  Hemoccult positive.  Anemia panel with severe iron deficiency.  Admits to using BC powder.  Transfused 1 unit with appropriate response.  EGD and colonoscopy as above.  H&H stable. Recent Labs    04/04/22 1607 04/05/22 0638 05/29/22 1254 05/29/22 2057 05/30/22 0410 05/31/22 0031 06/01/22 0011  HGB 5.5* 7.6* 6.9* 7.1* 8.1* 9.1* 8.4*  -Transfused 1 unit of received IV ferric gluconate 250 mg x 2.  P.o. ferrous sulfate twice daily -P.o. Protonix 40 mg twice daily for 8 weeks followed by daily per recommendation by GI -Follow biopsy from polyps and H. pylori testing -Advised to avoid NSAID   Hypokalemia hypophosphatemia: K3.1. -Received IV KCl 10x3 and p.o. KCl 40x1 prior to discharge -CT discontinued on discharge. -Renal panel in 1 to 2 weeks   Chronic COPD/tobacco use disorder: Stable.  Smokes about a pack a day.  About 60-pack-year history.  Not motivated to stop. -Continue home inhalers -Encouraged nicotine patch   Essential hypertension: BP slightly elevated but improved. -Continue amlodipine and losartan.   Shortness of breath/fatigue: Likely due to anemia and  underlying COPD.  Improved. -No need identified by therapy.   Hospital delirium: Might have underlying cognitive impairment although no history of this.  Resolved.           Vital signs Vitals:   06/01/22 1317 06/01/22 1330 06/01/22 1335 06/01/22 1356  BP: 113/75 121/74 128/88 128/79  Pulse: 71 75 68 84  Temp: 97.7 F (36.5 C)  97.7 F (36.5 C) 97.8 F (36.6 C)  Resp: '12 16 18 18   '$ Height:      Weight:      SpO2: 99% 96% 97% 99%  TempSrc:    Oral  BMI (Calculated):         Discharge exam  GENERAL: No apparent distress.  Walking in the room. HEENT: MMM.  Vision and hearing grossly intact.  NECK: Supple.  No apparent JVD.  RESP:  No IWOB.  Fair aeration bilaterally. CVS:  RRR. Heart sounds normal.  ABD/GI/GU: BS+. Abd soft, NTND.  MSK/EXT:  Moves extremities. No apparent deformity. No edema.  SKIN: no apparent skin lesion or wound NEURO: Awake and alert. Oriented appropriately.  No apparent focal neuro deficit. PSYCH: Calm. Normal affect.   Discharge Instructions Discharge Instructions     Call MD for:  difficulty breathing, headache or visual disturbances   Complete by: As directed    Call MD for:  extreme fatigue   Complete by: As directed    Call MD for:  persistant dizziness or light-headedness   Complete by: As directed    Call MD for:  severe uncontrolled pain   Complete by: As directed    Diet - low sodium heart healthy   Complete by: As directed    Discharge instructions   Complete by: As directed    It has been a pleasure taking care of you!  You were hospitalized due to fatigue and shortness of breath likely from anemia.  We suspect your anemia is from slow chronic blood loss.  Endoscopy showed gastritis (inflammation of the stomach).  Colonoscopy showed polyps and hemorrhoids.  We are discharging you on Protonix per recommendation by gastroenterology.  We recommend you avoid any over-the-counter pain medication other than plain Tylenol due to risk of bleeding.  We have stopped some of the medications that could increase your risk of bleeding.  Please review your new medication list and the directions on your medications before you take them.  Follow-up with your primary care doctor in 1 to 2 weeks or sooner if needed.   Take care,   Increase activity slowly   Complete by: As directed       Allergies as of 06/01/2022   No Known  Allergies      Medication List     STOP taking these medications    BC HEADACHE POWDER PO   lisinopril-hydrochlorothiazide 10-12.5 MG tablet Commonly known as: ZESTORETIC   meloxicam 7.5 MG tablet Commonly known as: MOBIC       TAKE these medications    acetaminophen 500 MG tablet Commonly known as: TYLENOL Take 2 tablets (1,000 mg total) by mouth every 8 (eight) hours as needed for mild pain, headache, fever or moderate pain.   albuterol 108 (90 Base) MCG/ACT inhaler Commonly known as: VENTOLIN HFA Inhale 2 puffs into the lungs every 6 (six) hours as needed for wheezing or shortness of breath.   amLODipine 5 MG tablet Commonly known as: NORVASC Take 5 mg by mouth daily.   budesonide-formoterol 160-4.5 MCG/ACT inhaler Commonly known as: Symbicort INHALE  2 PUFFS INTO LUNGS TWICE A DAY   losartan 50 MG tablet Commonly known as: COZAAR TAKE 1 TABLET (50 MG TOTAL) BY MOUTH DAILY AT 10 PM.   pantoprazole 40 MG tablet Commonly known as: PROTONIX Take 1 tablet (40 mg total) by mouth 2 (two) times daily for 60 days, THEN 1 tablet (40 mg total) daily. Start taking on: June 01, 2022        Consultations: Gastroenterology  Procedures/Studies: EGD on 9/16 - Normal esophagus. - Hiatal hernia. - Gastritis. Biopsied. - Normal examined duodenum. Recommendation - Return patient to hospital ward for ongoing care. - Use a proton pump inhibitor PO BID for 8 weeks. - Check serum H pylori antibody. Treat if positive. - Continue daily iron supplementation. - Patient was not adequately cleaned out for a colonoscopy today  Colonoscopy on 9/17 - Preparation of the colon was inadequate. - The examined portion of the ileum was normal. - One 2 mm polyp in the transverse colon, removed with a cold biopsy forceps. Resected and retrieved. - Diverticulosis in the sigmoid colon. - Stool in the entire examined colon. - Non-bleeding internal hemorrhoids. Recommendation: -  Return patient to hospital ward for ongoing care. - No source of blood loss was seen on today's exam. Able to rule out large colon masses. - Await pathology results. - Continue daily iron supplementation. - Follow up with GI clinic as an outpatient. - The findings and recommendations were discussed with the patient.   DG Chest 2 View  Result Date: 05/29/2022 CLINICAL DATA:  Shortness of breath. EXAM: CHEST - 2 VIEW COMPARISON:  April 04, 2022. FINDINGS: Mild cardiomegaly is noted. Hyperinflation of the lungs is noted. Lungs are clear. Bony thorax is unremarkable. IMPRESSION: Hyperinflation of the lungs. No acute cardiopulmonary abnormality seen. Electronically Signed   By: Marijo Conception M.D.   On: 05/29/2022 13:24       The results of significant diagnostics from this hospitalization (including imaging, microbiology, ancillary and laboratory) are listed below for reference.     Microbiology: No results found for this or any previous visit (from the past 240 hour(s)).   Labs:  CBC: Recent Labs  Lab 05/29/22 1254 05/29/22 2057 05/30/22 0410 05/31/22 0031 06/01/22 0011  WBC 8.4  --  6.3 7.9 5.7  NEUTROABS 5.5  --  4.0  --   --   HGB 6.9* 7.1* 8.1* 9.1* 8.4*  HCT 25.2* 25.1* 28.8* 32.0* 30.5*  MCV 64.0*  --  65.8* 65.3* 65.9*  PLT 338  --  301 311 311   BMP &GFR Recent Labs  Lab 05/29/22 1254 05/29/22 2057 05/30/22 0410 05/31/22 0031 06/01/22 0011  NA 141  --  142 141 142  K 2.6* 3.0* 2.9* 3.8 3.1*  CL 110  --  106 105 106  CO2 25  --  '26 24 25  '$ GLUCOSE 136*  --  85 84 89  BUN 9  --  '9 9 10  '$ CREATININE 1.09  --  0.98 0.97 1.10  CALCIUM 8.5*  --  8.9 9.4 9.0  MG 1.6*  --  1.9 1.8 1.7  PHOS  --   --   --  2.1* 3.1   Estimated Creatinine Clearance: 49.9 mL/min (by C-G formula based on SCr of 1.1 mg/dL). Liver & Pancreas: Recent Labs  Lab 05/30/22 0410 05/31/22 0031 06/01/22 0011  AST 19  --   --   ALT 11  --   --   ALKPHOS 78  --   --  BILITOT 1.7*  --    --   PROT 6.0*  --   --   ALBUMIN 3.6 3.9 3.5   No results for input(s): "LIPASE", "AMYLASE" in the last 168 hours. No results for input(s): "AMMONIA" in the last 168 hours. Diabetic: No results for input(s): "HGBA1C" in the last 72 hours. No results for input(s): "GLUCAP" in the last 168 hours. Cardiac Enzymes: No results for input(s): "CKTOTAL", "CKMB", "CKMBINDEX", "TROPONINI" in the last 168 hours. No results for input(s): "PROBNP" in the last 8760 hours. Coagulation Profile: No results for input(s): "INR", "PROTIME" in the last 168 hours. Thyroid Function Tests: No results for input(s): "TSH", "T4TOTAL", "FREET4", "T3FREE", "THYROIDAB" in the last 72 hours. Lipid Profile: No results for input(s): "CHOL", "HDL", "LDLCALC", "TRIG", "CHOLHDL", "LDLDIRECT" in the last 72 hours. Anemia Panel: Recent Labs    05/29/22 1447 05/29/22 2057 05/30/22 0410  VITAMINB12  --   --  412  FOLATE  --  14.7  --   FERRITIN  --  5*  --   TIBC  --   --  440  IRON  --   --  33*  RETICCTPCT 0.7  --   --    Urine analysis: No results found for: "COLORURINE", "APPEARANCEUR", "LABSPEC", "PHURINE", "GLUCOSEU", "HGBUR", "BILIRUBINUR", "KETONESUR", "PROTEINUR", "UROBILINOGEN", "NITRITE", "LEUKOCYTESUR" Sepsis Labs: Invalid input(s): "PROCALCITONIN", "LACTICIDVEN"   SIGNED:  Mercy Riding, MD  [facility] 06/01/2022, 1:58 PM

## 2022-06-01 NOTE — Op Note (Signed)
[facility] Patient Name: Brett Vasquez Procedure Date : 06/01/2022 MRN: [id number] Attending MD: Georgian Co ,  Date of Birth: 1946/08/02 CSN: [id number] Age: 76 Admit Type: Inpatient Procedure:                Colonoscopy Indications:              Iron deficiency anemia Providers:                Adline Mango" Erenest Rasher. Tilden Dome, RN, Cherylynn Ridges, Technician, Eligha Bridegroom CRNA, CRNA Referring MD:             Hospitalist team Medicines:                Monitored Anesthesia Care Complications:            No immediate complications. Estimated Blood Loss:     Estimated blood loss was minimal. Procedure:                Pre-Anesthesia Assessment:                           - Prior to the procedure, a History and Physical                            was performed, and patient medications and                            allergies were reviewed. The patient's tolerance of                            previous anesthesia was also reviewed. The risks                            and benefits of the procedure and the sedation                            options and risks were discussed with the patient.                            All questions were answered, and informed consent                            was obtained. Prior Anticoagulants: The patient has                            taken no previous anticoagulant or antiplatelet                            agents. ASA Grade Assessment: III - A patient with                            severe systemic disease. After reviewing the risks  and benefits, the patient was deemed in                            satisfactory condition to undergo the procedure.                           After obtaining informed consent, the colonoscope                            was passed under direct vision. Throughout the                            procedure, the patient's blood pressure, pulse, and                             oxygen saturations were monitored continuously. The                            CF-HQ190L ([id number] Olympus colonoscope was                            introduced through the anus and advanced to the the                            cecum, identified by appendiceal orifice and                            ileocecal valve. The colonoscopy was performed                            without difficulty. The patient tolerated the                            procedure well. The quality of the bowel                            preparation was inadequate. The ileocecal valve,                            appendiceal orifice, and rectum were photographed. Scope In: 12:48:58 PM Scope Out: 1:09:38 PM Total Procedure Duration: 0 hours 20 minutes 40 seconds  Findings:      The terminal ileum appeared normal.      A 2 mm polyp was found in the transverse colon. The polyp was sessile.       The polyp was removed with a cold biopsy forceps. Resection and       retrieval were complete.      Multiple small and large-mouthed diverticula were found in the sigmoid       colon.      Semi-solid stool was found in the entire colon, interfering with       visualization.      Non-bleeding internal hemorrhoids were found during retroflexion. Impression:               - Preparation of the colon was inadequate.                           -  The examined portion of the ileum was normal.                           - One 2 mm polyp in the transverse colon, removed                            with a cold biopsy forceps. Resected and retrieved.                           - Diverticulosis in the sigmoid colon.                           - Stool in the entire examined colon.                           - Non-bleeding internal hemorrhoids. Recommendation:           - Return patient to hospital ward for ongoing care.                           - No source of blood loss was seen on today's exam.                             Able to rule out large colon masses.                           - Await pathology results.                           - Continue daily iron supplementation.                           - Follow up with GI clinic as an outpatient.                           - The findings and recommendations were discussed                            with the patient. Procedure Code(s):        --- Professional ---                           [phone], Colonoscopy, flexible; with biopsy, single                            or multiple Diagnosis Code(s):        --- Professional ---                           K64.8, Other hemorrhoids                           K63.5, Polyp of colon                           D50.9, Iron deficiency anemia, unspecified  K57.30, Diverticulosis of large intestine without                            perforation or abscess without bleeding CPT copyright 2019 [employer] Association. All rights reserved. The codes documented in this report are preliminary and upon coder review may  be revised to meet current compliance requirements. Dr Georgian Co "[name]" Lorenso Courier,  06/01/2022 1:24:18 PM Number of Addenda: 0

## 2022-06-01 NOTE — Interval H&P Note (Signed)
History and Physical Interval Note:  06/01/2022 12:26 PM  Brett Vasquez  has presented today for surgery, with the diagnosis of Iron deficiency anemia, possible melena.  The various methods of treatment have been discussed with the patient and family. After consideration of risks, benefits and other options for treatment, the patient has consented to  Procedure(s): COLONOSCOPY WITH PROPOFOL (N/A) as a surgical intervention.  The patient's history has been reviewed, patient examined, no change in status, stable for surgery.  I have reviewed the patient's chart and labs.  Questions were answered to the patient's satisfaction.     Sharyn Creamer

## 2022-06-01 NOTE — Anesthesia Procedure Notes (Signed)
Procedure Name: MAC Date/Time: 06/01/2022 12:36 PM  Performed by: Reece Agar, CRNAPre-anesthesia Checklist: Patient identified, Emergency Drugs available, Suction available and Patient being monitored Patient Re-evaluated:Patient Re-evaluated prior to induction Oxygen Delivery Method: Simple face mask

## 2022-06-02 ENCOUNTER — Encounter (HOSPITAL_COMMUNITY): Payer: Self-pay | Admitting: Internal Medicine

## 2022-06-02 LAB — H. PYLORI ANTIBODY, IGG: H Pylori IgG: 6.77 Index Value — ABNORMAL HIGH (ref 0.00–0.79)

## 2022-06-03 ENCOUNTER — Other Ambulatory Visit: Payer: Self-pay

## 2022-06-03 ENCOUNTER — Telehealth: Payer: Self-pay

## 2022-06-03 ENCOUNTER — Encounter: Payer: Self-pay | Admitting: Internal Medicine

## 2022-06-03 LAB — SURGICAL PATHOLOGY

## 2022-06-03 MED ORDER — TETRACYCLINE HCL 500 MG PO CAPS: 500.0000 mg | ORAL_CAPSULE | Freq: Four times a day (QID) | ORAL | 0 refills | Status: AC

## 2022-06-03 MED ORDER — BISMUTH SUBSALICYLATE 262 MG PO TABS: 2.0000 | ORAL_TABLET | Freq: Four times a day (QID) | ORAL | 0 refills | Status: AC

## 2022-06-03 MED ORDER — METRONIDAZOLE 250 MG PO TABS: 250.0000 mg | ORAL_TABLET | Freq: Four times a day (QID) | ORAL | 0 refills | Status: AC

## 2022-06-03 NOTE — Telephone Encounter (Signed)
Pt scheduled for f/u on 07/01/22 at 10:00 am with [name] PA. Called pt to let him know and also mailed letter to pt.

## 2022-06-03 NOTE — Telephone Encounter (Signed)
-----   Message from Sharyn Creamer, MD sent at 06/01/2022  1:24 PM EDT ----- Aileen Pilot, please arrange for 2-3 week follow up for IDA with Dr. Bryan Lemma or APP. Thanks.

## 2022-06-03 NOTE — Progress Notes (Signed)
Hi Beth, please call to let the patient know that his lab studies showed that he has an infection with a bacteria called H pylori. Recommend bismuth quadruple therapy for treatment: - Tetracycline 500 mg QID x 14 days - Flagyl 250 mg QID x 14 days - Bismuth subsalicylate 950 mg QID x 14 days - PPI BID x 14 days  About 4 weeks after completion of therapy, patient will need to be checked for eradication with a stool H pylori antigen. PPI therapy will need to be held 2 weeks prior to checking for eradication.

## 2022-06-16 DIAGNOSIS — D649 Anemia, unspecified: Secondary | ICD-10-CM | POA: Diagnosis not present

## 2022-06-16 DIAGNOSIS — E876 Hypokalemia: Secondary | ICD-10-CM | POA: Diagnosis not present

## 2022-06-16 DIAGNOSIS — K922 Gastrointestinal hemorrhage, unspecified: Secondary | ICD-10-CM | POA: Diagnosis not present

## 2022-06-19 ENCOUNTER — Other Ambulatory Visit: Payer: Self-pay | Admitting: Family Medicine

## 2022-06-19 DIAGNOSIS — J439 Emphysema, unspecified: Secondary | ICD-10-CM

## 2022-06-19 NOTE — Telephone Encounter (Signed)
Requested medication (s) are due for refill today - expired Rx  Requested medication (s) are on the active medication list -yes  Future visit scheduled -yes  Last refill: 06/10/21 10.2 18RF  Notes to clinic: Call to patient- scheduled annual physical- request for RF sent to office for review- expired Rx  Requested Prescriptions  Pending Prescriptions Disp Refills   SYMBICORT 160-4.5 MCG/ACT inhaler [Pharmacy Med Name: SYMBICORT 160-4.5 MCG INHALER] 10.2 each 18    Sig: INHALE 2 PUFFS INTO LUNGS TWICE A DAY     Pulmonology:  Combination Products Failed - 06/19/2022  2:52 AM      Failed - Valid encounter within last 12 months    Recent Outpatient Visits           1 year ago Benign essential HTN   [place] Pickard, Cammie Mcgee, MD   1 year ago Benign essential HTN   [place] Dennard Schaumann, Cammie Mcgee, MD   4 years ago Colon cancer screening   [place] Susy Frizzle, MD   4 years ago Benign essential HTN   [place] Susy Frizzle, MD   6 years ago Benign essential HTN   [place], Cammie Mcgee, MD       Future Appointments             In 1 week Pickard, Cammie Mcgee, MD [place], [employer]   In 2 weeks C[place]ovic, Collene Mares, [facility]               Requested Prescriptions  Pending Prescriptions Disp Refills   SYMBICORT 160-4.5 MCG/ACT inhaler [Pharmacy Med Name: SYMBICORT 160-4.5 MCG INHALER] 10.2 each 18    Sig: INHALE 2 PUFFS INTO LUNGS TWICE A DAY     Pulmonology:  Combination Products Failed - 06/19/2022  2:52 AM      Failed - Valid encounter within last 12 months    Recent Outpatient Visits           1 year ago Benign essential HTN   [place] Pickard, Cammie Mcgee, MD   1 year ago Benign essential HTN   [place] Dennard Schaumann, Cammie Mcgee, MD   4 years ago Colon cancer screening   [place] Susy Frizzle, MD   4 years ago Benign essential HTN   [place] Susy Frizzle, MD   6 years ago Benign essential HTN   [place] Pickard, Cammie Mcgee, MD       Future Appointments             In 1 week Pickard, Cammie Mcgee, MD [place], [facility]   In 2 weeks Custovic, [place], [place] [facility]

## 2022-06-26 ENCOUNTER — Inpatient Hospital Stay: Admission: RE | Admit: 2022-06-26 | Payer: No Typology Code available for payment source | Source: Ambulatory Visit

## 2022-07-01 ENCOUNTER — Encounter: Payer: No Typology Code available for payment source | Admitting: Family Medicine

## 2022-07-01 ENCOUNTER — Ambulatory Visit: Payer: Medicare Other | Admitting: Physician Assistant

## 2022-07-09 ENCOUNTER — Ambulatory Visit: Payer: Medicare Other | Admitting: Internal Medicine

## 2022-07-17 DIAGNOSIS — K922 Gastrointestinal hemorrhage, unspecified: Secondary | ICD-10-CM | POA: Diagnosis not present

## 2022-08-19 DIAGNOSIS — M1990 Unspecified osteoarthritis, unspecified site: Secondary | ICD-10-CM | POA: Diagnosis not present

## 2023-01-03 ENCOUNTER — Encounter (HOSPITAL_COMMUNITY): Payer: Self-pay

## 2023-01-03 ENCOUNTER — Other Ambulatory Visit: Payer: Self-pay

## 2023-01-03 ENCOUNTER — Emergency Department (HOSPITAL_COMMUNITY)
Admission: EM | Admit: 2023-01-03 | Discharge: 2023-01-03 | Discharge status: Against Medical Advice | Payer: Medicare Other | Source: Home / Self Care | Attending: Emergency Medicine | Admitting: Emergency Medicine

## 2023-01-03 ENCOUNTER — Emergency Department (HOSPITAL_COMMUNITY): Payer: Medicare Other

## 2023-01-03 DIAGNOSIS — E860 Dehydration: Secondary | ICD-10-CM | POA: Diagnosis not present

## 2023-01-03 DIAGNOSIS — R0602 Shortness of breath: Secondary | ICD-10-CM | POA: Diagnosis not present

## 2023-01-03 DIAGNOSIS — G9341 Metabolic encephalopathy: Secondary | ICD-10-CM | POA: Diagnosis not present

## 2023-01-03 DIAGNOSIS — J449 Chronic obstructive pulmonary disease, unspecified: Secondary | ICD-10-CM | POA: Insufficient documentation

## 2023-01-03 DIAGNOSIS — E876 Hypokalemia: Secondary | ICD-10-CM | POA: Diagnosis not present

## 2023-01-03 DIAGNOSIS — E7800 Pure hypercholesterolemia, unspecified: Secondary | ICD-10-CM | POA: Diagnosis not present

## 2023-01-03 DIAGNOSIS — I5033 Acute on chronic diastolic (congestive) heart failure: Secondary | ICD-10-CM | POA: Diagnosis not present

## 2023-01-03 DIAGNOSIS — J439 Emphysema, unspecified: Secondary | ICD-10-CM | POA: Diagnosis not present

## 2023-01-03 DIAGNOSIS — J9621 Acute and chronic respiratory failure with hypoxia: Secondary | ICD-10-CM | POA: Diagnosis not present

## 2023-01-03 DIAGNOSIS — Z7951 Long term (current) use of inhaled steroids: Secondary | ICD-10-CM | POA: Diagnosis not present

## 2023-01-03 DIAGNOSIS — Z743 Need for continuous supervision: Secondary | ICD-10-CM | POA: Diagnosis not present

## 2023-01-03 DIAGNOSIS — J441 Chronic obstructive pulmonary disease with (acute) exacerbation: Secondary | ICD-10-CM | POA: Diagnosis not present

## 2023-01-03 DIAGNOSIS — I499 Cardiac arrhythmia, unspecified: Secondary | ICD-10-CM | POA: Diagnosis not present

## 2023-01-03 DIAGNOSIS — I10 Essential (primary) hypertension: Secondary | ICD-10-CM | POA: Diagnosis not present

## 2023-01-03 DIAGNOSIS — D509 Iron deficiency anemia, unspecified: Secondary | ICD-10-CM | POA: Diagnosis not present

## 2023-01-03 DIAGNOSIS — R062 Wheezing: Secondary | ICD-10-CM | POA: Diagnosis not present

## 2023-01-03 DIAGNOSIS — R5381 Other malaise: Secondary | ICD-10-CM | POA: Diagnosis not present

## 2023-01-03 DIAGNOSIS — R64 Cachexia: Secondary | ICD-10-CM | POA: Diagnosis not present

## 2023-01-03 DIAGNOSIS — J9622 Acute and chronic respiratory failure with hypercapnia: Secondary | ICD-10-CM | POA: Diagnosis not present

## 2023-01-03 DIAGNOSIS — I110 Hypertensive heart disease with heart failure: Secondary | ICD-10-CM | POA: Diagnosis not present

## 2023-01-03 DIAGNOSIS — J440 Chronic obstructive pulmonary disease with acute lower respiratory infection: Secondary | ICD-10-CM | POA: Diagnosis not present

## 2023-01-03 DIAGNOSIS — N179 Acute kidney failure, unspecified: Secondary | ICD-10-CM | POA: Diagnosis not present

## 2023-01-03 DIAGNOSIS — J13 Pneumonia due to Streptococcus pneumoniae: Secondary | ICD-10-CM | POA: Diagnosis present

## 2023-01-03 DIAGNOSIS — Z79899 Other long term (current) drug therapy: Secondary | ICD-10-CM | POA: Diagnosis not present

## 2023-01-03 DIAGNOSIS — Z66 Do not resuscitate: Secondary | ICD-10-CM | POA: Diagnosis not present

## 2023-01-03 DIAGNOSIS — R0902 Hypoxemia: Secondary | ICD-10-CM | POA: Diagnosis not present

## 2023-01-03 DIAGNOSIS — F17210 Nicotine dependence, cigarettes, uncomplicated: Secondary | ICD-10-CM | POA: Diagnosis not present

## 2023-01-03 DIAGNOSIS — R079 Chest pain, unspecified: Secondary | ICD-10-CM | POA: Diagnosis not present

## 2023-01-03 DIAGNOSIS — R6889 Other general symptoms and signs: Secondary | ICD-10-CM | POA: Diagnosis not present

## 2023-01-03 LAB — BASIC METABOLIC PANEL
Anion gap: 14 (ref 5–15)
BUN: 11 mg/dL (ref 8–23)
CO2: 24 mmol/L (ref 22–32)
Calcium: 8.8 mg/dL — ABNORMAL LOW (ref 8.9–10.3)
Chloride: 101 mmol/L (ref 98–111)
Creatinine, Ser: 0.99 mg/dL (ref 0.61–1.24)
GFR, Estimated: >60 mL/min (ref >60)
Glucose, Bld: 119 mg/dL — ABNORMAL HIGH (ref 70–99)
Potassium: 2.2 mmol/L — CL (ref 3.5–5.1)
Sodium: 139 mmol/L (ref 135–145)

## 2023-01-03 LAB — TROPONIN I (HIGH SENSITIVITY): Troponin I (High Sensitivity): 34 ng/L — ABNORMAL HIGH (ref <18)

## 2023-01-03 LAB — CBC
HCT: 27.2 % — ABNORMAL LOW (ref 39.0–52.0)
Hemoglobin: 7.6 g/dL — ABNORMAL LOW (ref 13.0–17.0)
MCH: 18 pg — ABNORMAL LOW (ref 26.0–34.0)
MCHC: 27.9 g/dL — ABNORMAL LOW (ref 30.0–36.0)
MCV: 64.3 fL — ABNORMAL LOW (ref 80.0–100.0)
Platelets: 342 10*3/uL (ref 150–400)
RBC: 4.23 MIL/uL (ref 4.22–5.81)
RDW: 20 % — ABNORMAL HIGH (ref 11.5–15.5)
WBC: 7.9 10*3/uL (ref 4.0–10.5)
nRBC: 0 % (ref 0.0–0.2)

## 2023-01-03 MED ORDER — IPRATROPIUM-ALBUTEROL 0.5-2.5 (3) MG/3ML IN SOLN
3.0000 mL | Freq: Once | RESPIRATORY_TRACT | Status: AC
  Administered 2023-01-03: 3 mL via RESPIRATORY_TRACT
  Filled 2023-01-03: qty 3

## 2023-01-03 MED ORDER — POTASSIUM CHLORIDE CRYS ER 20 MEQ PO TBCR
40.0000 meq | EXTENDED_RELEASE_TABLET | Freq: Once | ORAL | Status: AC
  Administered 2023-01-03: 40 meq via ORAL
  Filled 2023-01-03: qty 2

## 2023-01-03 MED ORDER — PREDNISONE 10 MG PO TABS: 40.0000 mg | ORAL_TABLET | Freq: Every day | ORAL | 0 refills | Status: DC

## 2023-01-03 MED ORDER — METHYLPREDNISOLONE SODIUM SUCC 125 MG IJ SOLR
125.0000 mg | Freq: Once | INTRAMUSCULAR | Status: AC
  Administered 2023-01-03: 125 mg via INTRAVENOUS
  Filled 2023-01-03: qty 2

## 2023-01-03 NOTE — ED Provider Notes (Signed)
[place] EMERGENCY DEPARTMENT AT [facility] Provider Note   CSN: [id number] Arrival date & time: 01/03/23  1105     History  No chief complaint on file.   Brett Vasquez is a 77 y.o. male.  77 year old male with prior medical history as detailed below presents for evaluation.  Patient complains of increased wheezing and cough x 24 hours.  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  He received albuterol nebulizer treatment which helped somewhat.  Patient complains of persistent wheezing now.  He denies chest pain.  He denies fever.  He is requesting repeat breathing treatment.  Of note, patient is emphatic that he does not need to be admitted.  He reports that he has to go to "collect some money" from someone who he loaned money to.  The history is provided by the patient and medical records.       Home Medications Prior to Admission medications   Medication Sig Start Date End Date Taking? Authorizing Provider  acetaminophen (TYLENOL) 500 MG tablet Take 2 tablets (1,000 mg total) by mouth every 8 (eight) hours as needed for mild pain, headache, fever or moderate pain. 06/01/22   Almon Hercules, MD  albuterol (VENTOLIN HFA) 108 (90 Base) MCG/ACT inhaler Inhale 2 puffs into the lungs every 6 (six) hours as needed for wheezing or shortness of breath. 10/28/21   [provider]  amLODipine (NORVASC) 5 MG tablet Take 5 mg by mouth daily. Patient not taking: Reported on 05/29/2022 01/18/22   [provider]  budesonide-formoterol (SYMBICORT) 160-4.5 MCG/ACT inhaler INHALE 2 PUFFS INTO LUNGS TWICE A DAY 06/24/22   Donita Brooks, MD  ferrous sulfate 325 (65 FE) MG EC tablet Take 1 tablet (325 mg total) by mouth 2 (two) times daily. 06/01/22 11/28/22  Almon Hercules, MD  losartan (COZAAR) 50 MG tablet TAKE 1 TABLET (50 MG TOTAL) BY MOUTH DAILY AT 10 PM. 06/02/22 08/31/22  Tolia, Sunit, DO  pantoprazole (PROTONIX)  40 MG tablet Take 1 tablet (40 mg total) by mouth 2 (two) times daily for 60 days, THEN 1 tablet (40 mg total) daily. 06/01/22 09/29/22  Almon Hercules, MD  polyethylene glycol powder (MIRALAX) 17 GM/SCOOP powder Take 17 g by mouth 2 (two) times daily as needed for moderate constipation. 06/01/22   Almon Hercules, MD  senna-docusate (SENOKOT-S) 8.6-50 MG tablet Take 1 tablet by mouth 2 (two) times daily between meals as needed for mild constipation. 06/01/22   Almon Hercules, MD      Allergies    Patient has no known allergies.    Review of Systems   Review of Systems  All other systems reviewed and are negative.   Physical Exam Updated Vital Signs BP (!) 158/100 (BP Location: Right Arm)   Pulse (!) 115   Temp 99.6 F (37.6 C) (Oral)   Resp 17   SpO2 96%  Physical Exam Vitals and nursing note reviewed.  Constitutional:      General: He is not in acute distress.    Appearance: Normal appearance. He is well-developed.  HENT:     Head: Normocephalic and atraumatic.  Eyes:     Conjunctiva/sclera: Conjunctivae normal.     Pupils: Pupils are equal, round, and reactive to light.  Cardiovascular:     Rate and Rhythm: Normal rate and regular rhythm.     Heart sounds: Normal heart sounds.  Pulmonary:  Effort: Pulmonary effort is normal. No respiratory distress.     Comments: Trace expiratory wheezes in all lung fields. Abdominal:     General: There is no distension.     Palpations: Abdomen is soft.     Tenderness: There is no abdominal tenderness.  Musculoskeletal:        General: No deformity. Normal range of motion.     Cervical back: Normal range of motion and neck supple.  Skin:    General: Skin is warm and dry.  Neurological:     General: No focal deficit present.     Mental Status: He is alert and oriented to person, place, and time.     ED Results / Procedures / Treatments   Labs (all labs ordered are listed, but only abnormal results are displayed) Labs Reviewed  CBC  - Abnormal; Notable for the following components:      Result Value   Hemoglobin 7.6 (*)    HCT 27.2 (*)    MCV 64.3 (*)    MCH 18.0 (*)    MCHC 27.9 (*)    RDW 20.0 (*)    All other components within normal limits  BASIC METABOLIC PANEL  TROPONIN I (HIGH SENSITIVITY)    EKG EKG Interpretation  Date/Time:  Saturday January 03 2023 11:07:51 EDT Ventricular Rate:  114 PR Interval:  156 QRS Duration: 84 QT Interval:  354 QTC Calculation: 487 R Axis:   -36 Text Interpretation: Sinus tachycardia with Premature atrial complexes with Abberant conduction Possible Left atrial enlargement Left axis deviation Poor baseline When compared with ECG of 29-May-2022 12:49, PREVIOUS ECG IS PRESENT Confirmed by Kristine Royal [phone]) on 01/03/2023 11:46:57 AM  Radiology No results found.  Procedures Procedures    Medications Ordered in ED Medications  ipratropium-albuterol (DUONEB) 0.5-2.5 (3) MG/3ML nebulizer solution 3 mL (has no administration in time range)  methylPREDNISolone sodium succinate (SOLU-MEDROL) 125 mg/2 mL injection 125 mg (has no administration in time range)    ED Course/ Medical Decision Making/ A&P                             Medical Decision Making Amount and/or Complexity of Data Reviewed Labs: ordered. Radiology: ordered.  Risk Prescription drug management.    Medical Screen Complete  This patient presented to the ED with complaint of shortness of breath.  This complaint involves an extensive number of treatment options. The initial differential diagnosis includes, but is not limited to, COPD exacerbation, etc.  This presentation is: Acute, Chronic, Self-Limited, Previously Undiagnosed, Uncertain Prognosis, Complicated, Systemic Symptoms, and Threat to Life/Bodily Function  Patient with known history of COPD presents with increased cough and bronchospasm.  Patient improved with Solu-Medrol and bronchodilators.  Patient persistently declines additional  workup and/or admission.  Patient has capacity to refuse care.  Upon patient's daughter's arrival to the ED extensively discussed with both the patient and the daughter the indications for continued workup and/or admission.  Patient declined same.  He prefers to go home.  Patient is advised to return to the ED if he has any desire to have continued care.  Patient's daughter is also aware of the availability of care here at the hospital if the patient decides that he would like to return.  Patient is leaving AMA prior to completion of ED evaluation.  Importance of close follow-up was stressed.  Strict return precautions given and understood. Additional history obtained:  External records from outside  sources obtained and reviewed including prior ED visits and prior Inpatient records.    Lab Tests:  I ordered and personally interpreted labs.  The pertinent results include: CBC, BMP, troponin x 1   Imaging Studies ordered:  I ordered imaging studies including chest x-ray I independently visualized and interpreted obtained imaging which showed NAD I agree with the radiologist interpretation.   Cardiac Monitoring:  The patient was maintained on a cardiac monitor.  I personally viewed and interpreted the cardiac monitor which showed an underlying rhythm of: Sinus tach   Medicines ordered:  I ordered medication including potassium p.o., DuoNeb, Solu-Medrol for hypokalemia, bronchospasm Reevaluation of the patient after these medicines showed that the patient: improved    Problem List / ED Course:  COPD exacerbation, hypokalemia   Reevaluation:  After the interventions noted above, I reevaluated the patient and found that they have: improved  Disposition:  After consideration of the diagnostic results and the patients response to treatment, I feel that the patent would benefit from completion of ED evaluation.          Final Clinical Impression(s) / ED  Diagnoses Final diagnoses:  Chronic obstructive pulmonary disease, unspecified COPD type    Rx / DC Orders ED Discharge Orders          Ordered    predniSONE (DELTASONE) 10 MG tablet  Daily        01/03/23 1432              Wynetta Fines, MD 01/03/23 [phone]

## 2023-01-03 NOTE — ED Triage Notes (Signed)
Pt BIB by [facility] from the fire station where he went d/t c/o SOB for 2 days.He went initially to get help but then refused transport to hospital, then went back & both times received an albuterol neb tx. Upon arrival to ED he was still on NRB & when removed he was 95% on RA. A/Ox4, denies pain, Hx of COPD- wheezing heard in all fields (per EMS),was hypertensive @ 190/102, 112 bpm. No PIV.

## 2023-01-03 NOTE — Discharge Instructions (Signed)
Return for any problem.  ?

## 2023-01-04 ENCOUNTER — Emergency Department (HOSPITAL_COMMUNITY): Payer: Medicare Other

## 2023-01-04 ENCOUNTER — Inpatient Hospital Stay (HOSPITAL_COMMUNITY)
Admission: EM | Admit: 2023-01-04 | Discharge: 2023-01-10 | DRG: 193 | Disposition: A | Discharge status: Home Health | Payer: Medicare Other | Attending: Internal Medicine | Admitting: Internal Medicine

## 2023-01-04 ENCOUNTER — Other Ambulatory Visit: Payer: Self-pay

## 2023-01-04 ENCOUNTER — Encounter (HOSPITAL_COMMUNITY): Payer: Self-pay | Admitting: Internal Medicine

## 2023-01-04 DIAGNOSIS — I5033 Acute on chronic diastolic (congestive) heart failure: Secondary | ICD-10-CM | POA: Diagnosis present

## 2023-01-04 DIAGNOSIS — Z79899 Other long term (current) drug therapy: Secondary | ICD-10-CM

## 2023-01-04 DIAGNOSIS — I10 Essential (primary) hypertension: Secondary | ICD-10-CM | POA: Diagnosis present

## 2023-01-04 DIAGNOSIS — R64 Cachexia: Secondary | ICD-10-CM | POA: Diagnosis present

## 2023-01-04 DIAGNOSIS — J9622 Acute and chronic respiratory failure with hypercapnia: Secondary | ICD-10-CM | POA: Diagnosis not present

## 2023-01-04 DIAGNOSIS — D509 Iron deficiency anemia, unspecified: Secondary | ICD-10-CM | POA: Diagnosis present

## 2023-01-04 DIAGNOSIS — J440 Chronic obstructive pulmonary disease with acute lower respiratory infection: Secondary | ICD-10-CM | POA: Diagnosis present

## 2023-01-04 DIAGNOSIS — G9341 Metabolic encephalopathy: Secondary | ICD-10-CM | POA: Diagnosis present

## 2023-01-04 DIAGNOSIS — E860 Dehydration: Secondary | ICD-10-CM | POA: Diagnosis present

## 2023-01-04 DIAGNOSIS — F17200 Nicotine dependence, unspecified, uncomplicated: Secondary | ICD-10-CM | POA: Diagnosis present

## 2023-01-04 DIAGNOSIS — J441 Chronic obstructive pulmonary disease with (acute) exacerbation: Secondary | ICD-10-CM | POA: Diagnosis present

## 2023-01-04 DIAGNOSIS — F17210 Nicotine dependence, cigarettes, uncomplicated: Secondary | ICD-10-CM | POA: Diagnosis present

## 2023-01-04 DIAGNOSIS — J439 Emphysema, unspecified: Secondary | ICD-10-CM

## 2023-01-04 DIAGNOSIS — N179 Acute kidney failure, unspecified: Secondary | ICD-10-CM | POA: Diagnosis present

## 2023-01-04 DIAGNOSIS — R5381 Other malaise: Secondary | ICD-10-CM | POA: Diagnosis present

## 2023-01-04 DIAGNOSIS — E7800 Pure hypercholesterolemia, unspecified: Secondary | ICD-10-CM | POA: Diagnosis present

## 2023-01-04 DIAGNOSIS — Z7951 Long term (current) use of inhaled steroids: Secondary | ICD-10-CM

## 2023-01-04 DIAGNOSIS — R062 Wheezing: Secondary | ICD-10-CM | POA: Diagnosis not present

## 2023-01-04 DIAGNOSIS — R0902 Hypoxemia: Secondary | ICD-10-CM | POA: Diagnosis not present

## 2023-01-04 DIAGNOSIS — Z66 Do not resuscitate: Secondary | ICD-10-CM | POA: Diagnosis present

## 2023-01-04 DIAGNOSIS — J449 Chronic obstructive pulmonary disease, unspecified: Secondary | ICD-10-CM | POA: Diagnosis not present

## 2023-01-04 DIAGNOSIS — E876 Hypokalemia: Secondary | ICD-10-CM | POA: Diagnosis present

## 2023-01-04 DIAGNOSIS — Z743 Need for continuous supervision: Secondary | ICD-10-CM | POA: Diagnosis not present

## 2023-01-04 DIAGNOSIS — R0602 Shortness of breath: Secondary | ICD-10-CM | POA: Diagnosis present

## 2023-01-04 DIAGNOSIS — J9621 Acute and chronic respiratory failure with hypoxia: Secondary | ICD-10-CM | POA: Diagnosis present

## 2023-01-04 DIAGNOSIS — R079 Chest pain, unspecified: Secondary | ICD-10-CM | POA: Diagnosis not present

## 2023-01-04 DIAGNOSIS — I110 Hypertensive heart disease with heart failure: Secondary | ICD-10-CM | POA: Diagnosis present

## 2023-01-04 DIAGNOSIS — I499 Cardiac arrhythmia, unspecified: Secondary | ICD-10-CM | POA: Diagnosis not present

## 2023-01-04 DIAGNOSIS — J189 Pneumonia, unspecified organism: Secondary | ICD-10-CM | POA: Diagnosis present

## 2023-01-04 DIAGNOSIS — J13 Pneumonia due to Streptococcus pneumoniae: Secondary | ICD-10-CM | POA: Diagnosis present

## 2023-01-04 DIAGNOSIS — R6889 Other general symptoms and signs: Secondary | ICD-10-CM | POA: Diagnosis not present

## 2023-01-04 LAB — CBC WITH DIFFERENTIAL/PLATELET
Abs Immature Granulocytes: 0 10*3/uL (ref 0.00–0.07)
Basophils Absolute: 0 10*3/uL (ref 0.0–0.1)
Basophils Relative: 0 %
Eosinophils Absolute: 0 10*3/uL (ref 0.0–0.5)
Eosinophils Relative: 0 %
HCT: 28.2 % — ABNORMAL LOW (ref 39.0–52.0)
Hemoglobin: 7.4 g/dL — ABNORMAL LOW (ref 13.0–17.0)
Lymphocytes Relative: 3 %
Lymphs Abs: 0.5 10*3/uL — ABNORMAL LOW (ref 0.7–4.0)
MCH: 17.5 pg — ABNORMAL LOW (ref 26.0–34.0)
MCHC: 26.2 g/dL — ABNORMAL LOW (ref 30.0–36.0)
MCV: 66.5 fL — ABNORMAL LOW (ref 80.0–100.0)
Monocytes Absolute: 0.4 10*3/uL (ref 0.1–1.0)
Monocytes Relative: 2 %
Neutro Abs: 17.1 10*3/uL — ABNORMAL HIGH (ref 1.7–7.7)
Neutrophils Relative %: 95 %
Platelets: 389 10*3/uL (ref 150–400)
RBC: 4.24 MIL/uL (ref 4.22–5.81)
RDW: 20.1 % — ABNORMAL HIGH (ref 11.5–15.5)
WBC: 18 10*3/uL — ABNORMAL HIGH (ref 4.0–10.5)
nRBC: 0 /100 WBC
nRBC: 0.2 % (ref 0.0–0.2)

## 2023-01-04 LAB — I-STAT VENOUS BLOOD GAS, ED
Acid-base deficit: 2 mmol/L (ref 0.0–2.0)
Bicarbonate: 23.3 mmol/L (ref 20.0–28.0)
Calcium, Ion: 1.03 mmol/L — ABNORMAL LOW (ref 1.15–1.40)
HCT: 29 % — ABNORMAL LOW (ref 39.0–52.0)
Hemoglobin: 9.9 g/dL — ABNORMAL LOW (ref 13.0–17.0)
O2 Saturation: 89 %
Potassium: 2.5 mmol/L — CL (ref 3.5–5.1)
Sodium: 140 mmol/L (ref 135–145)
TCO2: 25 mmol/L (ref 22–32)
pCO2, Ven: 42 mmHg — ABNORMAL LOW (ref 44–60)
pH, Ven: 7.351 (ref 7.25–7.43)
pO2, Ven: 59 mmHg — ABNORMAL HIGH (ref 32–45)

## 2023-01-04 LAB — BASIC METABOLIC PANEL
Anion gap: 15 (ref 5–15)
BUN: 22 mg/dL (ref 8–23)
CO2: 22 mmol/L (ref 22–32)
Calcium: 8.4 mg/dL — ABNORMAL LOW (ref 8.9–10.3)
Chloride: 103 mmol/L (ref 98–111)
Creatinine, Ser: 1.32 mg/dL — ABNORMAL HIGH (ref 0.61–1.24)
GFR, Estimated: 55 mL/min — ABNORMAL LOW (ref >60)
Glucose, Bld: 175 mg/dL — ABNORMAL HIGH (ref 70–99)
Potassium: 2.6 mmol/L — CL (ref 3.5–5.1)
Sodium: 140 mmol/L (ref 135–145)

## 2023-01-04 LAB — TROPONIN I (HIGH SENSITIVITY)
Troponin I (High Sensitivity): 137 ng/L (ref <18)
Troponin I (High Sensitivity): 323 ng/L (ref <18)

## 2023-01-04 LAB — STREP PNEUMONIAE URINARY ANTIGEN: Strep Pneumo Urinary Antigen: POSITIVE — AB

## 2023-01-04 LAB — BRAIN NATRIURETIC PEPTIDE: B Natriuretic Peptide: 380.2 pg/mL — ABNORMAL HIGH (ref 0.0–100.0)

## 2023-01-04 MED ORDER — MOMETASONE FURO-FORMOTEROL FUM 200-5 MCG/ACT IN AERO
2.0000 | INHALATION_SPRAY | Freq: Two times a day (BID) | RESPIRATORY_TRACT | Status: DC
  Administered 2023-01-05 – 2023-01-10 (×10): 2 via RESPIRATORY_TRACT
  Filled 2023-01-04 (×4): qty 8.8

## 2023-01-04 MED ORDER — AMLODIPINE BESYLATE 5 MG PO TABS
5.0000 mg | ORAL_TABLET | Freq: Every day | ORAL | Status: DC
  Administered 2023-01-04 – 2023-01-07 (×4): 5 mg via ORAL
  Filled 2023-01-04 (×4): qty 1

## 2023-01-04 MED ORDER — METHYLPREDNISOLONE SODIUM SUCC 125 MG IJ SOLR
120.0000 mg | INTRAMUSCULAR | Status: AC
  Administered 2023-01-04: 120 mg via INTRAVENOUS
  Filled 2023-01-04: qty 2

## 2023-01-04 MED ORDER — MAGNESIUM SULFATE 2 GM/50ML IV SOLN
2.0000 g | Freq: Once | INTRAVENOUS | Status: AC
  Administered 2023-01-04: 2 g via INTRAVENOUS
  Filled 2023-01-04: qty 50

## 2023-01-04 MED ORDER — HALOPERIDOL LACTATE 5 MG/ML IJ SOLN: 2.0000 mg | Freq: Four times a day (QID) | INTRAMUSCULAR | Status: DC | PRN

## 2023-01-04 MED ORDER — BISACODYL 5 MG PO TBEC: 5.0000 mg | DELAYED_RELEASE_TABLET | Freq: Every day | ORAL | Status: DC | PRN

## 2023-01-04 MED ORDER — ONDANSETRON HCL 4 MG/2ML IJ SOLN: 4.0000 mg | Freq: Four times a day (QID) | INTRAMUSCULAR | Status: DC | PRN

## 2023-01-04 MED ORDER — SODIUM CHLORIDE 0.9 % IV SOLN: 500.0000 mg | INTRAVENOUS | Status: DC

## 2023-01-04 MED ORDER — ONDANSETRON HCL 4 MG PO TABS: 4.0000 mg | ORAL_TABLET | Freq: Four times a day (QID) | ORAL | Status: DC | PRN

## 2023-01-04 MED ORDER — ALBUTEROL SULFATE (2.5 MG/3ML) 0.083% IN NEBU: 2.5000 mg | INHALATION_SOLUTION | RESPIRATORY_TRACT | Status: DC | PRN

## 2023-01-04 MED ORDER — SODIUM CHLORIDE 0.9 % IV SOLN
500.0000 mg | Freq: Once | INTRAVENOUS | Status: AC
  Filled 2023-01-04: qty 5

## 2023-01-04 MED ORDER — IPRATROPIUM-ALBUTEROL 0.5-2.5 (3) MG/3ML IN SOLN
3.0000 mL | RESPIRATORY_TRACT | Status: DC
  Administered 2023-01-04: 3 mL via RESPIRATORY_TRACT

## 2023-01-04 MED ORDER — OXYCODONE HCL 5 MG PO TABS: 5.0000 mg | ORAL_TABLET | ORAL | Status: DC | PRN

## 2023-01-04 MED ORDER — IPRATROPIUM-ALBUTEROL 0.5-2.5 (3) MG/3ML IN SOLN
3.0000 mL | Freq: Four times a day (QID) | RESPIRATORY_TRACT | Status: DC
  Administered 2023-01-04 – 2023-01-10 (×18): 3 mL via RESPIRATORY_TRACT
  Filled 2023-01-04 (×21): qty 3

## 2023-01-04 MED ORDER — NICOTINE 14 MG/24HR TD PT24
14.0000 mg | MEDICATED_PATCH | Freq: Every day | TRANSDERMAL | Status: DC
  Administered 2023-01-04 – 2023-01-07 (×4): 14 mg via TRANSDERMAL
  Filled 2023-01-04 (×6): qty 1

## 2023-01-04 MED ORDER — IPRATROPIUM-ALBUTEROL 0.5-2.5 (3) MG/3ML IN SOLN
RESPIRATORY_TRACT | Status: AC
  Administered 2023-01-04: 3 mL via RESPIRATORY_TRACT
  Filled 2023-01-04: qty 3

## 2023-01-04 MED ORDER — HYDRALAZINE HCL 20 MG/ML IJ SOLN: 5.0000 mg | INTRAMUSCULAR | Status: DC | PRN

## 2023-01-04 MED ORDER — POLYETHYLENE GLYCOL 3350 17 G PO PACK: 17.0000 g | PACK | Freq: Every day | ORAL | Status: DC | PRN

## 2023-01-04 MED ORDER — POTASSIUM CHLORIDE 10 MEQ/100ML IV SOLN
10.0000 meq | INTRAVENOUS | Status: AC
  Administered 2023-01-04 (×6): 10 meq via INTRAVENOUS
  Filled 2023-01-04 (×6): qty 100

## 2023-01-04 MED ORDER — ACETAMINOPHEN 650 MG RE SUPP: 650.0000 mg | Freq: Four times a day (QID) | RECTAL | Status: DC | PRN

## 2023-01-04 MED ORDER — SODIUM CHLORIDE 0.9% FLUSH
3.0000 mL | Freq: Two times a day (BID) | INTRAVENOUS | Status: DC
  Administered 2023-01-04 – 2023-01-09 (×11): 3 mL via INTRAVENOUS

## 2023-01-04 MED ORDER — ASPIRIN 81 MG PO CHEW
324.0000 mg | CHEWABLE_TABLET | Freq: Once | ORAL | Status: AC
  Administered 2023-01-04: 324 mg via ORAL
  Filled 2023-01-04: qty 4

## 2023-01-04 MED ORDER — PREDNISONE 20 MG PO TABS
40.0000 mg | ORAL_TABLET | Freq: Every day | ORAL | Status: DC
  Administered 2023-01-05 – 2023-01-06 (×2): 40 mg via ORAL
  Filled 2023-01-04 (×2): qty 2

## 2023-01-04 MED ORDER — SODIUM CHLORIDE 0.9 % IV SOLN
2.0000 g | INTRAVENOUS | Status: DC
  Administered 2023-01-05 – 2023-01-07 (×3): 2 g via INTRAVENOUS
  Filled 2023-01-04 (×3): qty 20

## 2023-01-04 MED ORDER — ENOXAPARIN SODIUM 40 MG/0.4ML IJ SOSY
40.0000 mg | PREFILLED_SYRINGE | INTRAMUSCULAR | Status: DC
  Administered 2023-01-04 – 2023-01-09 (×6): 40 mg via SUBCUTANEOUS
  Filled 2023-01-04 (×6): qty 0.4

## 2023-01-04 MED ORDER — SODIUM CHLORIDE 0.9 % IV SOLN: INTRAVENOUS | Status: DC

## 2023-01-04 MED ORDER — ACETAMINOPHEN 325 MG PO TABS: 650.0000 mg | ORAL_TABLET | Freq: Four times a day (QID) | ORAL | Status: DC | PRN

## 2023-01-04 MED ORDER — DOCUSATE SODIUM 100 MG PO CAPS
100.0000 mg | ORAL_CAPSULE | Freq: Two times a day (BID) | ORAL | Status: DC
  Administered 2023-01-04 – 2023-01-10 (×10): 100 mg via ORAL
  Filled 2023-01-04 (×11): qty 1

## 2023-01-04 MED ORDER — SODIUM CHLORIDE 0.9 % IV SOLN
1.0000 g | Freq: Once | INTRAVENOUS | Status: AC
  Filled 2023-01-04: qty 10

## 2023-01-04 NOTE — ED Notes (Addendum)
Patient hit call light and reported that he needed to pee. RN entered room and the patient had unhooked the bipap mask from the vent. RN reconnected bipap and assisted patient to stand to use urinal. Patient was able to stand independently but was unable to pee at this time. Patient requested to sit in the chair by the bed and remains sitting in it at this time. Call bell and urinal within reach.

## 2023-01-04 NOTE — Progress Notes (Signed)
Patient arrived by EMS in respiratory distress.  Patient was placed on bipap once arrived to room. Patient tolerating well at this time.  Will continue to monitor.

## 2023-01-04 NOTE — ED Notes (Signed)
Lab called a critical Potassium 2.6 and troponin 137 at 1509 MD in the emergency room was made aware.

## 2023-01-04 NOTE — ED Notes (Signed)
ED TO INPATIENT HANDOFF REPORT  ED Nurse Name and Phone #: Madysun Thall  S Name/Age/Gender Brett Vasquez 77 y.o. male Room/Bed: 035C/035C  Code Status   Code Status: DNR  Home/SNF/Other Home Patient oriented to: self, place, time, and situation Is this baseline? Yes   Triage Complete: Triage complete  Chief Complaint COPD with acute exacerbation [J44.1]  Triage Note Pt BIB [facility] from home from home. Per EMS patient call them after taking inhaler. He refuse to go to the hospital this morning. Firefighter was on scene gave him Albuterol 5 and when EMS got there they gave him another Albuterol 5  and Atrovent 0.5 and Solumedrol 125. EMS reported before treating saturation was 75% on RA.  BP 180/100, HR 140, Saturation 100 when he  received breathing treatment.   Allergies No Known Allergies  Level of Care/Admitting Diagnosis ED Disposition     ED Disposition  Admit   Condition  --   Comment  Hospital [facility]: [facility] [[id number]]  Level of Care: Progressive [102]  Admit to Progressive based on following criteria: RESPIRATORY PROBLEMS hypoxemic/hypercapnic respiratory failure that is responsive to NIPPV (BiPAP) or High Flow Nasal Cannula (6-80 lpm). Frequent assessment/intervention, no > Q2 hrs < Q4 hrs, to maintain oxygenation and pulmonary hygiene.  May admit patient to [facility] or [facility] if equivalent level of care is available:: Yes  Covid Evaluation: Symptomatic Person Under Investigation (PUI) or recent exposure (last 10 days) *Testing Required*  Diagnosis: COPD with acute exacerbation [409811]  Admitting Physician: Jonah Blue [[id number]]  Attending Physician: Jonah Blue [[id number]]  Certification:: I certify this patient will need inpatient services for at least 2 midnights  Estimated Length of Stay: 3          B Medical/Surgery History Past Medical History:  Diagnosis Date   Anemia    COPD (chronic obstructive pulmonary disease)    High  cholesterol    Hypertension    Smoker unmotivated to quit    Past Surgical History:  Procedure Laterality Date   COLONOSCOPY WITH PROPOFOL N/A 06/01/2022   Procedure: COLONOSCOPY WITH PROPOFOL;  Surgeon: Imogene Burn, MD;  Location: [facility] ENDOSCOPY;  Service: Gastroenterology;  Laterality: N/A;   ESOPHAGOGASTRODUODENOSCOPY (EGD) WITH PROPOFOL N/A 05/31/2022   Procedure: ESOPHAGOGASTRODUODENOSCOPY (EGD) WITH PROPOFOL;  Surgeon: Imogene Burn, MD;  Location: [facility] ENDOSCOPY;  Service: Gastroenterology;  Laterality: N/A;   POLYPECTOMY  06/01/2022   Procedure: POLYPECTOMY;  Surgeon: Imogene Burn, MD;  Location: [facility] ENDOSCOPY;  Service: Gastroenterology;;     A IV Location/Drains/Wounds Patient Lines/Drains/Airways Status     Active Line/Drains/Airways     Name Placement date Placement time Site Days   Peripheral IV 01/04/23 20 G Distal;Posterior;Right Forearm 01/04/23  1335  Forearm  less than 1   Peripheral IV 01/04/23 20 G Anterior;Left;Proximal Forearm 01/04/23  1616  Forearm  less than 1            Intake/Output Last 24 hours No intake or output data in the 24 hours ending 01/04/23 1929  Labs/Imaging Results for orders placed or performed during the hospital encounter of 01/04/23 (from the past 48 hour(s))  Basic metabolic panel     Status: Abnormal   Collection Time: 01/04/23  2:00 PM  Result Value Ref Range   Sodium 140 135 - 145 mmol/L   Potassium 2.6 (LL) 3.5 - 5.1 mmol/L    Comment: CRITICAL RESULT CALLED TO, READ BACK BY AND VERIFIED WITH N,WELLINGTON RN  01/04/23 E,BENTON  Chloride 103 98 - 111 mmol/L   CO2 22 22 - 32 mmol/L   Glucose, Bld 175 (H) 70 - 99 mg/dL    Comment: Glucose reference range applies only to samples taken after fasting for at least 8 hours.   BUN 22 8 - 23 mg/dL   Creatinine, Ser 7.82 (H) 0.61 - 1.24 mg/dL   Calcium 8.4 (L) 8.9 - 10.3 mg/dL   GFR, Estimated 55 (L) >60 mL/min    Comment: (NOTE) Calculated using the CKD-EPI Creatinine  Equation (2021)    Anion gap 15 5 - 15    Comment: Performed at [facility], [address] [place], [place] [address]  CBC with Differential     Status: Abnormal   Collection Time: 01/04/23  2:00 PM  Result Value Ref Range   WBC 18.0 (H) 4.0 - 10.5 K/uL   RBC 4.24 4.22 - 5.81 MIL/uL   Hemoglobin 7.4 (L) 13.0 - 17.0 g/dL    Comment: Reticulocyte Hemoglobin testing may be clinically indicated, consider ordering this additional test [id number]    HCT 28.2 (L) 39.0 - 52.0 %   MCV 66.5 (L) 80.0 - 100.0 fL   MCH 17.5 (L) 26.0 - 34.0 pg   MCHC 26.2 (L) 30.0 - 36.0 g/dL   RDW 69.6 (H) 29.5 - 28.4 %   Platelets 389 150 - 400 K/uL    Comment: REPEATED TO VERIFY   nRBC 0.2 0.0 - 0.2 %   Neutrophils Relative % 95 %   Neutro Abs 17.1 (H) 1.7 - 7.7 K/uL   Lymphocytes Relative 3 %   Lymphs Abs 0.5 (L) 0.7 - 4.0 K/uL   Monocytes Relative 2 %   Monocytes Absolute 0.4 0.1 - 1.0 K/uL   Eosinophils Relative 0 %   Eosinophils Absolute 0.0 0.0 - 0.5 K/uL   Basophils Relative 0 %   Basophils Absolute 0.0 0.0 - 0.1 K/uL   nRBC 0 0 /100 WBC   Abs Immature Granulocytes 0.00 0.00 - 0.07 K/uL    Comment: Performed at [facility], [address], [place], [place] [address]  Brain natriuretic peptide     Status: Abnormal   Collection Time: 01/04/23  2:00 PM  Result Value Ref Range   B Natriuretic Peptide 380.2 (H) 0.0 - 100.0 pg/mL    Comment: Performed at [facility], [address] [place], [place] [address]  Troponin I (High Sensitivity)     Status: Abnormal   Collection Time: 01/04/23  2:00 PM  Result Value Ref Range   Troponin I (High Sensitivity) 137 (HH) <18 ng/L    Comment: CRITICAL RESULT CALLED TO, READ BACK BY AND VERIFIED WITH N,WELLINGTON RN @1509  01/04/23 E,BENTON (NOTE) Elevated high sensitivity troponin I (hsTnI) values and significant  changes across serial measurements may suggest ACS but many other  chronic and acute conditions are known to elevate hsTnI  results.  Refer to the "Links" section for chest pain algorithms and additional  guidance. Performed at [facility], [address] [place], [place] [address]   I-Stat venous blood gas, ED     Status: Abnormal   Collection Time: 01/04/23  2:12 PM  Result Value Ref Range   pH, Ven 7.351 7.25 - 7.43   pCO2, Ven 42.0 (L) 44 - 60 mmHg   pO2, Ven 59 (H) 32 - 45 mmHg   Bicarbonate 23.3 20.0 - 28.0 mmol/L   TCO2 25 22 - 32 mmol/L  O2 Saturation 89 %   Acid-base deficit 2.0 0.0 - 2.0 mmol/L   Sodium 140 135 - 145 mmol/L   Potassium 2.5 (LL) 3.5 - 5.1 mmol/L   Calcium, Ion 1.03 (L) 1.15 - 1.40 mmol/L   HCT 29.0 (L) 39.0 - 52.0 %   Hemoglobin 9.9 (L) 13.0 - 17.0 g/dL   Sample type VENOUS    Comment NOTIFIED PHYSICIAN   Troponin I (High Sensitivity)     Status: Abnormal   Collection Time: 01/04/23  3:19 PM  Result Value Ref Range   Troponin I (High Sensitivity) 323 (HH) <18 ng/L    Comment: CRITICAL VALUE NOTED. VALUE IS CONSISTENT WITH PREVIOUSLY REPORTED/CALLED VALUE (NOTE) Elevated high sensitivity troponin I (hsTnI) values and significant  changes across serial measurements may suggest ACS but many other  chronic and acute conditions are known to elevate hsTnI results.  Refer to the "Links" section for chest pain algorithms and additional  guidance. Performed at [facility], [address] [place], [place] [address]    DG Chest Port 1 View  Result Date: 01/04/2023 CLINICAL DATA:  Dyspnea.  Shortness of breath EXAM: PORTABLE CHEST 1 VIEW COMPARISON:  X-ray 01/03/2023 and older FINDINGS: Hyperinflation. No pneumothorax or effusion. Chronic lung changes. There is some asymmetric increased density at the left lung base. A subtle acute process is possible recommend follow-up. Borderline cardiopericardial silhouette. Tortuous ectatic aorta. Overlapping cardiac leads. IMPRESSION: Subtle opacity left lung base. An acute process is possible. Recommend follow-up.  Hyperinflation with underlying chronic lung disease Electronically Signed   By: Karen Kays M.D.   On: 01/04/2023 13:49   DG Chest 2 View  Result Date: 01/03/2023 CLINICAL DATA:  77 year old male with shortness of breath. EXAM: CHEST - 2 VIEW COMPARISON:  Chest radiographs 05/29/2022 and earlier. FINDINGS: Chronically tortuous aortic arch and emphysema demonstrated on prior CTA. Lung volumes and mediastinal contours are stable from last year. Visualized tracheal air column is within normal limits. No pneumothorax, pulmonary edema, pleural effusion or acute pulmonary opacity. No acute osseous abnormality identified. Negative visible bowel gas. IMPRESSION: 1. No acute cardiopulmonary abnormality. 2. Chronic Emphysema (ICD10-J43.9), tortuous aortic arch. Electronically Signed   By: Odessa Fleming M.D.   On: 01/03/2023 11:51    Pending Labs Unresulted Labs (From admission, onward)     Start     Ordered   01/05/23 0500  Basic metabolic panel  Tomorrow morning,   R        01/04/23 1620   01/05/23 0500  CBC  Tomorrow morning,   R        01/04/23 1620   01/04/23 1619  Culture, blood (routine x 2) Call MD if unable to obtain prior to antibiotics being given  (COPD / Pneumonia / Cellulitis / Lower Extremity Wound)  BLOOD CULTURE X 2,   R     Comments: If blood cultures drawn in Emergency Department - Do not draw and cancel order    01/04/23 1620   01/04/23 1619  Legionella Pneumophila Serogp 1 Ur Ag  (COPD / Pneumonia / Cellulitis / Lower Extremity Wound)  Once,   R        01/04/23 1620   01/04/23 1619  Strep pneumoniae urinary antigen  (COPD / Pneumonia / Cellulitis / Lower Extremity Wound)  Once,   R        01/04/23 1620   01/04/23 1618  Expectorated Sputum Assessment w Gram Stain, Rflx to Resp Cult  (COPD / Pneumonia /  Cellulitis / Lower Extremity Wound)  Once,   R        01/04/23 1620   01/04/23 1533  SARS Coronavirus 2 by RT PCR (hospital order, performed in [facility] hospital lab) *cepheid single  result test* Anterior Nasal Swab  (Tier 2 - SARS Coronavirus 2 by RT PCR (hospital order, performed in [facility] hospital lab) *cepheid single result test*)  Once,   R        01/04/23 1532            Vitals/Pain Today's Vitals   01/04/23 1851 01/04/23 1900 01/04/23 1915 01/04/23 1917  BP: (!) 162/79 (!) 163/112 (!) 159/99   Pulse:  (!) 108 99   Resp:  (!) 22 (!) 22   Temp:    98.4 F (36.9 C)  TempSrc:    Oral  SpO2:  100% 100%   PainSc:        Isolation Precautions No active isolations  Medications Medications  potassium chloride 10 mEq in 100 mL IVPB (10 mEq Intravenous New Bag/Given 01/04/23 1856)  mometasone-formoterol (DULERA) 200-5 MCG/ACT inhaler 2 puff (has no administration in time range)  0.9 %  sodium chloride infusion ( Intravenous New Bag/Given 01/04/23 1854)  acetaminophen (TYLENOL) tablet 650 mg (has no administration in time range)    Or  acetaminophen (TYLENOL) suppository 650 mg (has no administration in time range)  oxyCODONE (Oxy IR/ROXICODONE) immediate release tablet 5 mg (has no administration in time range)  docusate sodium (COLACE) capsule 100 mg (has no administration in time range)  polyethylene glycol (MIRALAX / GLYCOLAX) packet 17 g (has no administration in time range)  bisacodyl (DULCOLAX) EC tablet 5 mg (has no administration in time range)  ondansetron (ZOFRAN) tablet 4 mg (has no administration in time range)    Or  ondansetron (ZOFRAN) injection 4 mg (has no administration in time range)  nicotine (NICODERM CQ - dosed in mg/24 hours) patch 14 mg (14 mg Transdermal Patch Applied 01/04/23 1748)  hydrALAZINE (APRESOLINE) injection 5 mg (has no administration in time range)  cefTRIAXone (ROCEPHIN) 2 g in sodium chloride 0.9 % 100 mL IVPB (has no administration in time range)  azithromycin (ZITHROMAX) 500 mg in sodium chloride 0.9 % 250 mL IVPB (has no administration in time range)  methylPREDNISolone sodium succinate (SOLU-MEDROL) 125 mg/2 mL  injection 120 mg (120 mg Intravenous Given 01/04/23 1749)    Followed by  predniSONE (DELTASONE) tablet 40 mg (has no administration in time range)  ipratropium-albuterol (DUONEB) 0.5-2.5 (3) MG/3ML nebulizer solution 3 mL (3 mLs Nebulization Given 01/04/23 1918)  albuterol (PROVENTIL) (2.5 MG/3ML) 0.083% nebulizer solution 2.5 mg (has no administration in time range)  enoxaparin (LOVENOX) injection 40 mg (has no administration in time range)  sodium chloride flush (NS) 0.9 % injection 3 mL (has no administration in time range)  haloperidol lactate (HALDOL) injection 2-5 mg (has no administration in time range)  amLODipine (NORVASC) tablet 5 mg (5 mg Oral Given 01/04/23 1851)  magnesium sulfate IVPB 2 g 50 mL (0 g Intravenous Stopped 01/04/23 1534)  cefTRIAXone (ROCEPHIN) 1 g in sodium chloride 0.9 % 100 mL IVPB (0 g Intravenous Stopped 01/04/23 1608)  azithromycin (ZITHROMAX) 500 mg in sodium chloride 0.9 % 250 mL IVPB (0 mg Intravenous Stopped 01/04/23 1717)  aspirin chewable tablet 324 mg (324 mg Oral Given 01/04/23 1853)    Mobility walks with device     Focused Assessments Cardiac Assessment Handoff:    No results found for: "CKTOTAL", "  CKMB", "CKMBINDEX", "TROPONINI" No results found for: "DDIMER" Does the Patient currently have chest pain? No    R Recommendations: See Admitting Provider Note  Report given to:   Additional Notes:

## 2023-01-04 NOTE — ED Triage Notes (Signed)
Pt BIB [facility] from home from home. Per EMS patient call them after taking inhaler. He refuse to go to the hospital this morning. Firefighter was on scene gave him Albuterol 5 and when EMS got there they gave him another Albuterol 5  and Atrovent 0.5 and Solumedrol 125. EMS reported before treating saturation was 75% on RA.  BP 180/100, HR 140, Saturation 100 when he  received breathing treatment.

## 2023-01-04 NOTE — H&P (Signed)
History and Physical    Patient: Brett Vasquez WJX:[id number] DOB: 10-24-1945 DOA: 01/04/2023 DOS: the patient was seen and examined on 01/04/2023 PCP: Patient, No Pcp Per  Patient coming from: Home - lives alone; NOK: None   Chief Complaint: SOB  HPI: Brett Vasquez is a 77 y.o. male with medical history significant of COPD, HTN, HLD, and persistent tobacco dependence presenting with SOB.  He was seen in the ER yesterday and recommended for admission but declined.  He reports that he was feeling SOB more and it comes in waves and he can't breathe.  He decided that he will stop smoking this AM.  Some cough, nonproductive.  As soon as the BIPAP comes off, he is planning to leave AMA.    ER Course:  COPD exacerbation - seen yesterday and left AMA.  On BIPAP.  K+ low, WBC 18.  On Mag++.     Review of Systems: As mentioned in the history of present illness. All other systems reviewed and are negative.  Past Medical History:  Diagnosis Date   Anemia    COPD (chronic obstructive pulmonary disease)    High cholesterol    Hypertension    Smoker unmotivated to quit    Past Surgical History:  Procedure Laterality Date   COLONOSCOPY WITH PROPOFOL N/A 06/01/2022   Procedure: COLONOSCOPY WITH PROPOFOL;  Surgeon: Imogene Burn, MD;  Location: [facility] ENDOSCOPY;  Service: Gastroenterology;  Laterality: N/A;   ESOPHAGOGASTRODUODENOSCOPY (EGD) WITH PROPOFOL N/A 05/31/2022   Procedure: ESOPHAGOGASTRODUODENOSCOPY (EGD) WITH PROPOFOL;  Surgeon: Imogene Burn, MD;  Location: [facility] ENDOSCOPY;  Service: Gastroenterology;  Laterality: N/A;   POLYPECTOMY  06/01/2022   Procedure: POLYPECTOMY;  Surgeon: Imogene Burn, MD;  Location: [facility] ENDOSCOPY;  Service: Gastroenterology;;   Social History:  reports that he has been smoking cigarettes. He has been smoking an average of .5 packs per day. He has never used smokeless tobacco. He reports that he does not currently use drugs. He reports that he does not drink  alcohol.  No Known Allergies  Family History  Problem Relation Age of Onset   Cancer Sister     Prior to Admission medications   Medication Sig Start Date End Date Taking? Authorizing Provider  lisinopril-hydrochlorothiazide (ZESTORETIC) 10-12.5 MG tablet Take 1 tablet by mouth daily. 11/22/22  Yes [provider]  meloxicam (MOBIC) 7.5 MG tablet Take 7.5 mg by mouth at bedtime. 12/08/22  Yes [provider]  acetaminophen (TYLENOL) 500 MG tablet Take 2 tablets (1,000 mg total) by mouth every 8 (eight) hours as needed for mild pain, headache, fever or moderate pain. 06/01/22   Almon Hercules, MD  albuterol (VENTOLIN HFA) 108 (90 Base) MCG/ACT inhaler Inhale 2 puffs into the lungs every 6 (six) hours as needed for wheezing or shortness of breath. 10/28/21   [provider]  amLODipine (NORVASC) 5 MG tablet Take 5 mg by mouth daily. Patient not taking: Reported on 05/29/2022 01/18/22   [provider]  budesonide-formoterol (SYMBICORT) 160-4.5 MCG/ACT inhaler INHALE 2 PUFFS INTO LUNGS TWICE A DAY 06/24/22   Donita Brooks, MD  ferrous sulfate 325 (65 FE) MG EC tablet Take 1 tablet (325 mg total) by mouth 2 (two) times daily. 06/01/22 11/28/22  Almon Hercules, MD  losartan (COZAAR) 50 MG tablet TAKE 1 TABLET (50 MG TOTAL) BY MOUTH DAILY AT 10 PM. 06/02/22 08/31/22  Tolia, Sunit, DO  pantoprazole (PROTONIX) 40 MG tablet Take 1 tablet (40 mg total) by  mouth 2 (two) times daily for 60 days, THEN 1 tablet (40 mg total) daily. 06/01/22 09/29/22  Almon Hercules, MD  polyethylene glycol powder (MIRALAX) 17 GM/SCOOP powder Take 17 g by mouth 2 (two) times daily as needed for moderate constipation. 06/01/22   Almon Hercules, MD  predniSONE (DELTASONE) 10 MG tablet Take 4 tablets (40 mg total) by mouth daily for 4 days. 01/03/23 01/07/23  Wynetta Fines, MD  senna-docusate (SENOKOT-S) 8.6-50 MG tablet Take 1 tablet by mouth 2 (two) times daily between meals as needed for mild  constipation. 06/01/22   Almon Hercules, MD    Physical Exam: Vitals:   01/04/23 1315 01/04/23 1317 01/04/23 1330 01/04/23 1339  BP: (!) 173/92 (!) 173/92 (!) 181/125 (!) 129/99  Pulse: (!) 141 (!) 141 (!) 140 (!) 133  Resp: (!) 30 (!) 29 (!) 32 (!) 29  Temp:    99 F (37.2 C)  TempSrc:    Axillary  SpO2: 90% 100% 100% 100%   General:  Appears mildly anxious, on BIPAP Eyes:  EOMI, normal lids, iris ENT:  grossly normal hearing, lips & tongue; BIPAP in place Neck:  no LAD, masses or thyromegaly Cardiovascular:  RR with tachycardia. No LE edema.  Respiratory:   Diffuse wheezing, poor air movement. Mildly increased respiratory effort. Abdomen:  soft, NT, ND Skin:  no rash or induration seen on limited exam Musculoskeletal:  grossly normal tone BUE/BLE, good ROM, no bony abnormality Psychiatric:  blunted mood and affect, speech fluent and appropriate but cantankerous Neurologic:  CN 2-12 grossly intact, moves all extremities in coordinated fashion,limited by BIPAP   Radiological Exams on Admission: Independently reviewed - see discussion in A/P where applicable  DG Chest Port 1 View  Result Date: 01/04/2023 CLINICAL DATA:  Dyspnea.  Shortness of breath EXAM: PORTABLE CHEST 1 VIEW COMPARISON:  X-ray 01/03/2023 and older FINDINGS: Hyperinflation. No pneumothorax or effusion. Chronic lung changes. There is some asymmetric increased density at the left lung base. A subtle acute process is possible recommend follow-up. Borderline cardiopericardial silhouette. Tortuous ectatic aorta. Overlapping cardiac leads. IMPRESSION: Subtle opacity left lung base. An acute process is possible. Recommend follow-up. Hyperinflation with underlying chronic lung disease Electronically Signed   By: Karen Kays M.D.   On: 01/04/2023 13:49   DG Chest 2 View  Result Date: 01/03/2023 CLINICAL DATA:  77 year old male with shortness of breath. EXAM: CHEST - 2 VIEW COMPARISON:  Chest radiographs 05/29/2022 and  earlier. FINDINGS: Chronically tortuous aortic arch and emphysema demonstrated on prior CTA. Lung volumes and mediastinal contours are stable from last year. Visualized tracheal air column is within normal limits. No pneumothorax, pulmonary edema, pleural effusion or acute pulmonary opacity. No acute osseous abnormality identified. Negative visible bowel gas. IMPRESSION: 1. No acute cardiopulmonary abnormality. 2. Chronic Emphysema (ICD10-J43.9), tortuous aortic arch. Electronically Signed   By: Odessa Fleming M.D.   On: 01/03/2023 11:51    EKG: Independently reviewed.  Sinus tachycardia with rate 122; nonspecific ST changes with no evidence of acute ischemia   Labs on Admission: I have personally reviewed the available labs and imaging studies at the time of the admission.  Pertinent labs:    VBG: 7.351/42/23.3 K+ 2.2, 2.6 Glucose 175 BUN 22/Creatinine 1.32/GFR 55; 11/0.99/>60 on 4/20 BNP 380.2 HS troponin 32, 137 WBC 18 Hgb 7.4; 8.4 in 05/2022    Assessment and Plan: Principal Problem:   COPD with acute exacerbation Active Problems:   Tobacco use disorder   Essential hypertension  Left lower lobe pneumonia   AKI (acute kidney injury)   DNR (do not resuscitate)    Acute on chronic respiratory failure associated with a COPD exacerbation/LLL PNA -Patient's shortness of breath and cough are most likely caused by acute COPD exacerbation.  -Hypoxic to 90% with persistent tachypnea throughout hospitalization and despite BIPAP -He was seen in the ER yesterday and recommended for admission but refused. -He does not have fever but has leukocytosis.  -Chest x-ray shows possible LLL pneumonia -will admit patient to progressive care - with his quick need for return to the ER and markedly increased WOB, it seems likely that he will need several days of hospitalization to show sufficient improvement for discharge.  That said, he is very likely to sign out AMA when he no longer needs BIPAP and is at  significant risk of having to return and even out of facility death should he refuse to remain hospitalized. -Nebulizers: scheduled Duoneb and prn albuterol -Solu-Medrol 60 mg IV BID -> Prednisone 40 mg PO daily -IV Azithromycin and Rocephin for empiric CAP coverage -Continue Symbicort -Coordinated care with [facility] team/PT/OT/Nutrition/RT consults  AKI -Patient without baseline compromised renal function  -Current creatinine is >0.3 within 48 hours  -Likely due to prerenal secondary to severe dehydration in the setting of acute infection with respiratory distress -IVF -Follow up renal function by BMP -Avoid ACEI and NSAIDs   HTN -It is not clear which medications he is taking at home -He has not been taking amlodipine but will restart this -Hold ACE/ARB/diuretic in the setting of AKI  Tobacco dependence -Encourage cessation.   -This was discussed with the patient and should be reviewed on an ongoing basis.   -Patch declined by patient  DNR -I have discussed code status with the patient and her would not desire resuscitation and would prefer to die a natural death should that situation arise.  He was very clear in his desire to simply go home and deal with it rather than to remain hospitalized.   Total critical care time: 45 minutes Critical care time was exclusive of separately billable procedures and treating other patients. Critical care was necessary to treat or prevent imminent or life-threatening deterioration. Critical care was time spent personally by me on the following activities: development of treatment plan with patient and/or surrogate as well as nursing, discussions with consultants, evaluation of patient's response to treatment, examination of patient, obtaining history from patient or surrogate, ordering and performing treatments and interventions, ordering and review of laboratory studies, ordering and review of radiographic studies, pulse oximetry and re-evaluation of  patient's condition.     Advance Care Planning:   Code Status: DNR   Consults: RT; TOC team; nutrition; PT/OT  DVT Prophylaxis: Lovenox  Family Communication: None present; he declined to identify a NOK or have me call any family members  Severity of Illness: The appropriate patient status for this patient is INPATIENT. Inpatient status is judged to be reasonable and necessary in order to provide the required intensity of service to ensure the patient's safety. The patient's presenting symptoms, physical exam findings, and initial radiographic and laboratory data in the context of their chronic comorbidities is felt to place them at high risk for further clinical deterioration. Furthermore, it is not anticipated that the patient will be medically stable for discharge from the hospital within 2 midnights of admission.   * I certify that at the point of admission it is my clinical judgment that the patient will require inpatient  hospital care spanning beyond 2 midnights from the point of admission due to high intensity of service, high risk for further deterioration and high frequency of surveillance required.*  Author: Jonah Blue, MD 01/04/2023 4:34 PM  For on call review [email or web address].

## 2023-01-04 NOTE — ED Provider Notes (Addendum)
[place] EMERGENCY DEPARTMENT AT [facility] Provider Note  CSN: [id number] Arrival date & time: 01/04/23  1306  History  Chief Complaint  Patient presents with   Shortness of Breath   Brett Vasquez is a 77 y.o. male.  77 yo man presents via EMS for SOB requiring BIPAP. On EMS arrival, SpO2 found to be in mid-70s. En route with EMS, received Albuterol 5 x2, Atrovent 0.5, and Solumedrol 125.   PMH COPD with exacerbations (last seen in ED one day ago for same, refused admission, left AMA), IDA, osteoporosis, GI bleed, TIA, and current heavy tobacco use.   Patient dyspneic on BIPAP, unable to clearly answer questions. When asked how long his breathing has been labored, he replies "3 years" even when asked to repeat. He reports he has been taking his controller inhalers without missing doses or running out.    Home Medications Prior to Admission medications   Medication Sig Start Date End Date Taking? Authorizing Provider  lisinopril-hydrochlorothiazide (ZESTORETIC) 10-12.5 MG tablet Take 1 tablet by mouth daily. 11/22/22  Yes [provider]  meloxicam (MOBIC) 7.5 MG tablet Take 7.5 mg by mouth at bedtime. 12/08/22  Yes [provider]  acetaminophen (TYLENOL) 500 MG tablet Take 2 tablets (1,000 mg total) by mouth every 8 (eight) hours as needed for mild pain, headache, fever or moderate pain. 06/01/22   Almon Hercules, MD  albuterol (VENTOLIN HFA) 108 (90 Base) MCG/ACT inhaler Inhale 2 puffs into the lungs every 6 (six) hours as needed for wheezing or shortness of breath. 10/28/21   [provider]  amLODipine (NORVASC) 5 MG tablet Take 5 mg by mouth daily. Patient not taking: Reported on 05/29/2022 01/18/22   [provider]  budesonide-formoterol (SYMBICORT) 160-4.5 MCG/ACT inhaler INHALE 2 PUFFS INTO LUNGS TWICE A DAY 06/24/22   Donita Brooks, MD  ferrous sulfate 325 (65 FE) MG EC tablet Take 1 tablet (325 mg total) by mouth 2 (two) times  daily. 06/01/22 11/28/22  Almon Hercules, MD  losartan (COZAAR) 50 MG tablet TAKE 1 TABLET (50 MG TOTAL) BY MOUTH DAILY AT 10 PM. 06/02/22 08/31/22  Tolia, Sunit, DO  pantoprazole (PROTONIX) 40 MG tablet Take 1 tablet (40 mg total) by mouth 2 (two) times daily for 60 days, THEN 1 tablet (40 mg total) daily. 06/01/22 09/29/22  Almon Hercules, MD  polyethylene glycol powder (MIRALAX) 17 GM/SCOOP powder Take 17 g by mouth 2 (two) times daily as needed for moderate constipation. 06/01/22   Almon Hercules, MD  predniSONE (DELTASONE) 10 MG tablet Take 4 tablets (40 mg total) by mouth daily for 4 days. 01/03/23 01/07/23  Wynetta Fines, MD  senna-docusate (SENOKOT-S) 8.6-50 MG tablet Take 1 tablet by mouth 2 (two) times daily between meals as needed for mild constipation. 06/01/22   Almon Hercules, MD     Allergies    Patient has no known allergies.    Review of Systems   Review of Systems  Constitutional:  Positive for activity change. Negative for appetite change, chills, diaphoresis and fever.  Respiratory:  Positive for cough, chest tightness, shortness of breath and wheezing. Negative for choking and stridor.   Cardiovascular:  Negative for chest pain.   Physical Exam Updated Vital Signs BP (!) 129/99 (BP Location: Right Arm)   Pulse (!) 133   Temp 99 F (37.2 C) (Axillary)   Resp (!) 29   SpO2 100%  Physical Exam Constitutional:  General: He is awake. He is in acute distress.     Appearance: He is cachectic. He is ill-appearing. He is not diaphoretic.  Cardiovascular:     Rate and Rhythm: Regular rhythm. Tachycardia present.     Pulses: Normal pulses.     Heart sounds: Normal heart sounds. No murmur heard.    No friction rub.  Pulmonary:     Effort: Respiratory distress present.     Breath sounds: Wheezing present.  Chest:     Chest wall: No tenderness.  Abdominal:     General: Abdomen is flat. There is no distension.     Palpations: Abdomen is soft.     Tenderness: There is no  abdominal tenderness. There is no guarding or rebound.  Skin:    General: Skin is warm.     Capillary Refill: Capillary refill takes less than 2 seconds.  Neurological:     General: No focal deficit present.  Psychiatric:        Behavior: Behavior is cooperative.    ED Results / Procedures / Treatments   Labs (all labs ordered are listed, but only abnormal results are displayed) Labs Reviewed  BASIC METABOLIC PANEL - Abnormal; Notable for the following components:      Result Value   Potassium 2.6 (*)    Glucose, Bld 175 (*)    Creatinine, Ser 1.32 (*)    Calcium 8.4 (*)    GFR, Estimated 55 (*)    All other components within normal limits  CBC WITH DIFFERENTIAL/PLATELET - Abnormal; Notable for the following components:   WBC 18.0 (*)    Hemoglobin 7.4 (*)    HCT 28.2 (*)    MCV 66.5 (*)    MCH 17.5 (*)    MCHC 26.2 (*)    RDW 20.1 (*)    Neutro Abs 17.1 (*)    Lymphs Abs 0.5 (*)    All other components within normal limits  BRAIN NATRIURETIC PEPTIDE - Abnormal; Notable for the following components:   B Natriuretic Peptide 380.2 (*)    All other components within normal limits  I-STAT VENOUS BLOOD GAS, ED - Abnormal; Notable for the following components:   pCO2, Ven 42.0 (*)    pO2, Ven 59 (*)    Potassium 2.5 (*)    Calcium, Ion 1.03 (*)    HCT 29.0 (*)    Hemoglobin 9.9 (*)    All other components within normal limits  TROPONIN I (HIGH SENSITIVITY) - Abnormal; Notable for the following components:   Troponin I (High Sensitivity) 137 (*)    All other components within normal limits  BLOOD GAS, VENOUS  TROPONIN I (HIGH SENSITIVITY)   EKG EKG Interpretation  Date/Time:  Sunday January 04 2023 14:00:54 EDT Ventricular Rate:  122 PR Interval:  144 QRS Duration: 91 QT Interval:  323 QTC Calculation: 461 R Axis:   -29 Text Interpretation: Sinus tachycardia Ventricular premature complex Borderline left axis deviation Abnormal R-wave progression, early transition  Confirmed by Tanda Rockers ([id number]) on 01/04/2023 2:35:29 PM  Radiology DG Chest Port 1 View  Result Date: 01/04/2023 CLINICAL DATA:  Dyspnea.  Shortness of breath EXAM: PORTABLE CHEST 1 VIEW COMPARISON:  X-ray 01/03/2023 and older FINDINGS: Hyperinflation. No pneumothorax or effusion. Chronic lung changes. There is some asymmetric increased density at the left lung base. A subtle acute process is possible recommend follow-up. Borderline cardiopericardial silhouette. Tortuous ectatic aorta. Overlapping cardiac leads. IMPRESSION: Subtle opacity left lung base. An acute process is possible.  Recommend follow-up. Hyperinflation with underlying chronic lung disease Electronically Signed   By: Karen Kays M.D.   On: 01/04/2023 13:49   DG Chest 2 View  Result Date: 01/03/2023 CLINICAL DATA:  77 year old male with shortness of breath. EXAM: CHEST - 2 VIEW COMPARISON:  Chest radiographs 05/29/2022 and earlier. FINDINGS: Chronically tortuous aortic arch and emphysema demonstrated on prior CTA. Lung volumes and mediastinal contours are stable from last year. Visualized tracheal air column is within normal limits. No pneumothorax, pulmonary edema, pleural effusion or acute pulmonary opacity. No acute osseous abnormality identified. Negative visible bowel gas. IMPRESSION: 1. No acute cardiopulmonary abnormality. 2. Chronic Emphysema (ICD10-J43.9), tortuous aortic arch. Electronically Signed   By: Odessa Fleming M.D.   On: 01/03/2023 11:51    Procedures Procedures   Medications Ordered in ED Medications  ipratropium-albuterol (DUONEB) 0.5-2.5 (3) MG/3ML nebulizer solution 3 mL (3 mLs Nebulization Given 01/04/23 1300)  cefTRIAXone (ROCEPHIN) 1 g in sodium chloride 0.9 % 100 mL IVPB (has no administration in time range)  azithromycin (ZITHROMAX) 500 mg in sodium chloride 0.9 % 250 mL IVPB (has no administration in time range)  aspirin chewable tablet 324 mg (has no administration in time range)  potassium chloride 10 mEq in  100 mL IVPB (has no administration in time range)  magnesium sulfate IVPB 2 g 50 mL (2 g Intravenous New Bag/Given 01/04/23 1427)   ED Course/ Medical Decision Making/ A&P  Medical Decision Making 77 yo man here with worsening COPD exacerbation. Was last seen in ED yesterday for the same, but refused admission and left AMA. Here today via EMS, requiring BIPAP and Albuterol 5 x2, Atrovent 0.5, and Solumedrol 125 en route. Dyspneic on BIPAP here in ED as well. Will administer magnesium IV once and duoneb Q4. Will continue on BIPAP. Plan for admission once labs and CXR complete.   2:15 pm: Interval improvement of respiratory symptoms, still on BIPAP. Patient less dyspneic and asking for food, saying he hasn't eaten in 3 days. Awaiting labs prior to admission.   3:10 pm: CBC back, leukocytosis to 18. Likely due to steroids received recently for COPD. BMP shows potassium to 2.6 - wonder if element of iatrogenic given recent albuterol. Will order IV potassium 6 runs and call for admission.   3:30 pm: Spoke with [facility] hospitalist Dr. Ophelia Charter who will admit.   Amount and/or Complexity of Data Reviewed Independent Historian: EMS Labs: ordered. Decision-making details documented in ED Course.    Details: CBC, BMP, BNP, troponin Radiology: ordered. Decision-making details documented in ED Course.    Details: CXR ECG/medicine tests: ordered. Decision-making details documented in ED Course.  Risk OTC drugs. Prescription drug management. Decision regarding hospitalization.   Final Clinical Impression(s) / ED Diagnoses Final diagnoses:  COPD exacerbation  Acute on chronic respiratory failure with hypoxia and hypercapnia  Hypokalemia   Rx / DC Orders ED Discharge Orders     None      Fayette Pho, MD   Fayette Pho, MD 01/04/23 1512    Fayette Pho, MD 01/04/23 1531    Sloan Leiter, DO 01/04/23 1535

## 2023-01-04 NOTE — ED Notes (Signed)
Pt was taken off BIPAP and placed on Nasal cannula 4 Liters and he is satting 99%. Respiratory was made aware.

## 2023-01-05 DIAGNOSIS — J441 Chronic obstructive pulmonary disease with (acute) exacerbation: Secondary | ICD-10-CM | POA: Diagnosis not present

## 2023-01-05 LAB — BASIC METABOLIC PANEL
Anion gap: 14 (ref 5–15)
BUN: 19 mg/dL (ref 8–23)
CO2: 25 mmol/L (ref 22–32)
Calcium: 8.7 mg/dL — ABNORMAL LOW (ref 8.9–10.3)
Chloride: 100 mmol/L (ref 98–111)
Creatinine, Ser: 0.96 mg/dL (ref 0.61–1.24)
GFR, Estimated: >60 mL/min (ref >60)
Glucose, Bld: 130 mg/dL — ABNORMAL HIGH (ref 70–99)
Potassium: 2.8 mmol/L — ABNORMAL LOW (ref 3.5–5.1)
Sodium: 139 mmol/L (ref 135–145)

## 2023-01-05 LAB — CBC
HCT: 26.8 % — ABNORMAL LOW (ref 39.0–52.0)
Hemoglobin: 7.2 g/dL — ABNORMAL LOW (ref 13.0–17.0)
MCH: 17.6 pg — ABNORMAL LOW (ref 26.0–34.0)
MCHC: 26.9 g/dL — ABNORMAL LOW (ref 30.0–36.0)
MCV: 65.4 fL — ABNORMAL LOW (ref 80.0–100.0)
Platelets: 306 10*3/uL (ref 150–400)
RBC: 4.1 MIL/uL — ABNORMAL LOW (ref 4.22–5.81)
RDW: 19.9 % — ABNORMAL HIGH (ref 11.5–15.5)
WBC: 17 10*3/uL — ABNORMAL HIGH (ref 4.0–10.5)
nRBC: 0 % (ref 0.0–0.2)

## 2023-01-05 MED ORDER — POTASSIUM CHLORIDE CRYS ER 20 MEQ PO TBCR
40.0000 meq | EXTENDED_RELEASE_TABLET | Freq: Two times a day (BID) | ORAL | Status: AC
  Administered 2023-01-05 – 2023-01-06 (×4): 40 meq via ORAL
  Filled 2023-01-05 (×4): qty 2

## 2023-01-05 MED ORDER — ADULT MULTIVITAMIN W/MINERALS CH
1.0000 | ORAL_TABLET | Freq: Every day | ORAL | Status: DC
  Administered 2023-01-05 – 2023-01-07 (×3): 1 via ORAL
  Filled 2023-01-05 (×3): qty 1

## 2023-01-05 NOTE — Progress Notes (Signed)
Mobility Specialist - Progress Note   01/05/23 1531  Mobility  Activity Ambulated with assistance in room  Level of Assistance Contact guard assist, steadying assist  Assistive Device None  Distance Ambulated (ft) 5 ft  Activity Response Tolerated well  Mobility Referral Yes  $Mobility charge 1 Mobility   Pt received setting off chair alarm and standing at the end of bed with oxygen unplugged. Pt stating he was trying to get his urinal. Pt was assisted back to chair and alarm was reset. Pt was educated on using call bell when needing assistance instead of getting up himself. RN present throughout.   Alda Lea  Mobility Specialist Please contact via Special educational needs teacher or Rehab office at [phone]

## 2023-01-05 NOTE — ED Notes (Signed)
ED TO INPATIENT HANDOFF REPORT  ED Nurse Name and Phone #: Percival Spanish [phone]  S Name/Age/Gender Brett Vasquez 77 y.o. male Room/Bed: 010C/010C  Code Status   Code Status: DNR  Home/SNF/Other Home Patient oriented to: self, place, time, and situation Is this baseline? Yes   Triage Complete: Triage complete  Chief Complaint COPD with acute exacerbation [J44.1]  Triage Note Pt BIB [facility] from home from home. Per EMS patient call them after taking inhaler. He refuse to go to the hospital this morning. Firefighter was on scene gave him Albuterol 5 and when EMS got there they gave him another Albuterol 5  and Atrovent 0.5 and Solumedrol 125. EMS reported before treating saturation was 75% on RA.  BP 180/100, HR 140, Saturation 100 when he  received breathing treatment.   Allergies No Known Allergies  Level of Care/Admitting Diagnosis ED Disposition     ED Disposition  Admit   Condition  --   Comment  Hospital Area: [facility] [[id number]]  Level of Care: Progressive [102]  Admit to Progressive based on following criteria: RESPIRATORY PROBLEMS hypoxemic/hypercapnic respiratory failure that is responsive to NIPPV (BiPAP) or High Flow Nasal Cannula (6-80 lpm). Frequent assessment/intervention, no > Q2 hrs < Q4 hrs, to maintain oxygenation and pulmonary hygiene.  May admit patient to [facility] or [facility] if equivalent level of care is available:: Yes  Covid Evaluation: Symptomatic Person Under Investigation (PUI) or recent exposure (last 10 days) *Testing Required*  Diagnosis: COPD with acute exacerbation [454098]  Admitting Physician: Jonah Blue [[id number]]  Attending Physician: Jonah Blue [[id number]]  Certification:: I certify this patient will need inpatient services for at least 2 midnights  Estimated Length of Stay: 3          B Medical/Surgery History Past Medical History:  Diagnosis Date   Anemia    COPD (chronic obstructive pulmonary disease)     High cholesterol    Hypertension    Smoker unmotivated to quit    Past Surgical History:  Procedure Laterality Date   COLONOSCOPY WITH PROPOFOL N/A 06/01/2022   Procedure: COLONOSCOPY WITH PROPOFOL;  Surgeon: Imogene Burn, MD;  Location: [facility] ENDOSCOPY;  Service: Gastroenterology;  Laterality: N/A;   ESOPHAGOGASTRODUODENOSCOPY (EGD) WITH PROPOFOL N/A 05/31/2022   Procedure: ESOPHAGOGASTRODUODENOSCOPY (EGD) WITH PROPOFOL;  Surgeon: Imogene Burn, MD;  Location: [facility] ENDOSCOPY;  Service: Gastroenterology;  Laterality: N/A;   POLYPECTOMY  06/01/2022   Procedure: POLYPECTOMY;  Surgeon: Imogene Burn, MD;  Location: [facility] ENDOSCOPY;  Service: Gastroenterology;;     A IV Location/Drains/Wounds Patient Lines/Drains/Airways Status     Active Line/Drains/Airways     Name Placement date Placement time Site Days   Peripheral IV 01/04/23 20 G Distal;Posterior;Right Forearm 01/04/23  1335  Forearm  1   Peripheral IV 01/04/23 20 G Anterior;Left;Proximal Forearm 01/04/23  1616  Forearm  1            Intake/Output Last 24 hours  Intake/Output Summary (Last 24 hours) at 01/05/2023 1135 Last data filed at 01/05/2023 1191 Gross per 24 hour  Intake 400 ml  Output --  Net 400 ml    Labs/Imaging Results for orders placed or performed during the hospital encounter of 01/04/23 (from the past 48 hour(s))  Basic metabolic panel     Status: Abnormal   Collection Time: 01/04/23  2:00 PM  Result Value Ref Range   Sodium 140 135 - 145 mmol/L   Potassium 2.6 (LL) 3.5 - 5.1 mmol/L  Comment: CRITICAL RESULT CALLED TO, READ BACK BY AND VERIFIED WITH N,WELLINGTON RN @1509  01/04/23 E,BENTON   Chloride 103 98 - 111 mmol/L   CO2 22 22 - 32 mmol/L   Glucose, Bld 175 (H) 70 - 99 mg/dL    Comment: Glucose reference range applies only to samples taken after fasting for at least 8 hours.   BUN 22 8 - 23 mg/dL   Creatinine, Ser 7.82 (H) 0.61 - 1.24 mg/dL   Calcium 8.4 (L) 8.9 - 10.3 mg/dL   GFR, Estimated 55  (L) >60 mL/min    Comment: (NOTE) Calculated using the CKD-EPI Creatinine Equation (2021)    Anion gap 15 5 - 15    Comment: Performed at [facility], [address] [place], [place] [address]  CBC with Differential     Status: Abnormal   Collection Time: 01/04/23  2:00 PM  Result Value Ref Range   WBC 18.0 (H) 4.0 - 10.5 K/uL   RBC 4.24 4.22 - 5.81 MIL/uL   Hemoglobin 7.4 (L) 13.0 - 17.0 g/dL    Comment: Reticulocyte Hemoglobin testing may be clinically indicated, consider ordering this additional test [id number]    HCT 28.2 (L) 39.0 - 52.0 %   MCV 66.5 (L) 80.0 - 100.0 fL   MCH 17.5 (L) 26.0 - 34.0 pg   MCHC 26.2 (L) 30.0 - 36.0 g/dL   RDW 69.6 (H) 29.5 - 28.4 %   Platelets 389 150 - 400 K/uL    Comment: REPEATED TO VERIFY   nRBC 0.2 0.0 - 0.2 %   Neutrophils Relative % 95 %   Neutro Abs 17.1 (H) 1.7 - 7.7 K/uL   Lymphocytes Relative 3 %   Lymphs Abs 0.5 (L) 0.7 - 4.0 K/uL   Monocytes Relative 2 %   Monocytes Absolute 0.4 0.1 - 1.0 K/uL   Eosinophils Relative 0 %   Eosinophils Absolute 0.0 0.0 - 0.5 K/uL   Basophils Relative 0 %   Basophils Absolute 0.0 0.0 - 0.1 K/uL   nRBC 0 0 /100 WBC   Abs Immature Granulocytes 0.00 0.00 - 0.07 K/uL    Comment: Performed at [facility], [address] [place], [place] [address]  Brain natriuretic peptide     Status: Abnormal   Collection Time: 01/04/23  2:00 PM  Result Value Ref Range   B Natriuretic Peptide 380.2 (H) 0.0 - 100.0 pg/mL    Comment: Performed at [facility], [address], [place], [place] [address]  Troponin I (High Sensitivity)     Status: Abnormal   Collection Time: 01/04/23  2:00 PM  Result Value Ref Range   Troponin I (High Sensitivity) 137 (HH) <18 ng/L    Comment: CRITICAL RESULT CALLED TO, READ BACK BY AND VERIFIED WITH N,WELLINGTON RN @1509  01/04/23 E,BENTON (NOTE) Elevated high sensitivity troponin I (hsTnI) values and significant  changes across serial measurements may suggest ACS  but many other  chronic and acute conditions are known to elevate hsTnI results.  Refer to the "Links" section for chest pain algorithms and additional  guidance. Performed at [facility], [address], [place], [place] [address]   I-Stat venous blood gas, ED     Status: Abnormal   Collection Time: 01/04/23  2:12 PM  Result Value Ref Range   pH, Ven 7.351 7.25 - 7.43   pCO2, Ven 42.0 (L) 44 - 60 mmHg   pO2, Ven 59 (H) 32 - 45 mmHg  Bicarbonate 23.3 20.0 - 28.0 mmol/L   TCO2 25 22 - 32 mmol/L   O2 Saturation 89 %   Acid-base deficit 2.0 0.0 - 2.0 mmol/L   Sodium 140 135 - 145 mmol/L   Potassium 2.5 (LL) 3.5 - 5.1 mmol/L   Calcium, Ion 1.03 (L) 1.15 - 1.40 mmol/L   HCT 29.0 (L) 39.0 - 52.0 %   Hemoglobin 9.9 (L) 13.0 - 17.0 g/dL   Sample type VENOUS    Comment NOTIFIED PHYSICIAN   Troponin I (High Sensitivity)     Status: Abnormal   Collection Time: 01/04/23  3:19 PM  Result Value Ref Range   Troponin I (High Sensitivity) 323 (HH) <18 ng/L    Comment: CRITICAL VALUE NOTED. VALUE IS CONSISTENT WITH PREVIOUSLY REPORTED/CALLED VALUE (NOTE) Elevated high sensitivity troponin I (hsTnI) values and significant  changes across serial measurements may suggest ACS but many other  chronic and acute conditions are known to elevate hsTnI results.  Refer to the "Links" section for chest pain algorithms and additional  guidance. Performed at [facility], [address], [place], [place] [address]   Strep pneumoniae urinary antigen     Status: Abnormal   Collection Time: 01/04/23  6:21 PM  Result Value Ref Range   Strep Pneumo Urinary Antigen POSITIVE (A) NEGATIVE    Comment: Performed at [facility], [address] [place], [place] [address]  Basic metabolic panel     Status: Abnormal   Collection Time: 01/05/23  3:52 AM  Result Value Ref Range   Sodium 139 135 - 145 mmol/L   Potassium 2.8 (L) 3.5 - 5.1 mmol/L   Chloride 100 98 - 111 mmol/L   CO2 25 22 - 32  mmol/L   Glucose, Bld 130 (H) 70 - 99 mg/dL    Comment: Glucose reference range applies only to samples taken after fasting for at least 8 hours.   BUN 19 8 - 23 mg/dL   Creatinine, Ser 8.65 0.61 - 1.24 mg/dL   Calcium 8.7 (L) 8.9 - 10.3 mg/dL   GFR, Estimated >78 >46 mL/min    Comment: (NOTE) Calculated using the CKD-EPI Creatinine Equation (2021)    Anion gap 14 5 - 15    Comment: Performed at [facility], [address] [place], [place] [address]  CBC     Status: Abnormal   Collection Time: 01/05/23  3:52 AM  Result Value Ref Range   WBC 17.0 (H) 4.0 - 10.5 K/uL   RBC 4.10 (L) 4.22 - 5.81 MIL/uL   Hemoglobin 7.2 (L) 13.0 - 17.0 g/dL    Comment: Reticulocyte Hemoglobin testing may be clinically indicated, consider ordering this additional test [id number]    HCT 26.8 (L) 39.0 - 52.0 %   MCV 65.4 (L) 80.0 - 100.0 fL   MCH 17.6 (L) 26.0 - 34.0 pg   MCHC 26.9 (L) 30.0 - 36.0 g/dL   RDW 01.0 (H) 27.2 - 53.6 %   Platelets 306 150 - 400 K/uL    Comment: REPEATED TO VERIFY   nRBC 0.0 0.0 - 0.2 %    Comment: Performed at [facility], [address], [place], [place] [address]   DG Chest Port 1 View  Result Date: 01/04/2023 CLINICAL DATA:  Dyspnea.  Shortness of breath EXAM: PORTABLE CHEST 1 VIEW COMPARISON:  X-ray 01/03/2023 and older FINDINGS: Hyperinflation. No pneumothorax or effusion. Chronic lung changes. There is some asymmetric increased density at the left  lung base. A subtle acute process is possible recommend follow-up. Borderline cardiopericardial silhouette. Tortuous ectatic aorta. Overlapping cardiac leads. IMPRESSION: Subtle opacity left lung base. An acute process is possible. Recommend follow-up. Hyperinflation with underlying chronic lung disease Electronically Signed   By: Karen Kays M.D.   On: 01/04/2023 13:49   DG Chest 2 View  Result Date: 01/03/2023 CLINICAL DATA:  77 year old male with shortness of breath. EXAM: CHEST - 2 VIEW COMPARISON:  Chest  radiographs 05/29/2022 and earlier. FINDINGS: Chronically tortuous aortic arch and emphysema demonstrated on prior CTA. Lung volumes and mediastinal contours are stable from last year. Visualized tracheal air column is within normal limits. No pneumothorax, pulmonary edema, pleural effusion or acute pulmonary opacity. No acute osseous abnormality identified. Negative visible bowel gas. IMPRESSION: 1. No acute cardiopulmonary abnormality. 2. Chronic Emphysema (ICD10-J43.9), tortuous aortic arch. Electronically Signed   By: Odessa Fleming M.D.   On: 01/03/2023 11:51    Pending Labs Unresulted Labs (From admission, onward)     Start     Ordered   01/04/23 1619  Culture, blood (routine x 2) Call MD if unable to obtain prior to antibiotics being given  (COPD / Pneumonia / Cellulitis / Lower Extremity Wound)  BLOOD CULTURE X 2,   R (with TIMED occurrences)     Comments: If blood cultures drawn in Emergency Department - Do not draw and cancel order    01/04/23 1620   01/04/23 1619  Legionella Pneumophila Serogp 1 Ur Ag  (COPD / Pneumonia / Cellulitis / Lower Extremity Wound)  Once,   R        01/04/23 1620   01/04/23 1618  Expectorated Sputum Assessment w Gram Stain, Rflx to Resp Cult  (COPD / Pneumonia / Cellulitis / Lower Extremity Wound)  Once,   R        01/04/23 1620   01/04/23 1533  SARS Coronavirus 2 by RT PCR (hospital order, performed in [facility] hospital lab) *cepheid single result test* Anterior Nasal Swab  (Tier 2 - SARS Coronavirus 2 by RT PCR (hospital order, performed in [facility] hospital lab) *cepheid single result test*)  Once,   R        01/04/23 1532            Vitals/Pain Today's Vitals   01/05/23 0717 01/05/23 0718 01/05/23 0900 01/05/23 0930  BP: (!) 168/93  (!) 164/93 (!) 151/84  Pulse: 94  (!) 102 94  Resp: (!) 22  (!) 24 (!) 22  Temp: 97.6 F (36.4 C)     TempSrc: Oral     SpO2: 100% 100% 100% 100%  Weight:      Height:      PainSc:        Isolation  Precautions No active isolations  Medications Medications  mometasone-formoterol (DULERA) 200-5 MCG/ACT inhaler 2 puff (2 puffs Inhalation Given 01/05/23 0721)  0.9 %  sodium chloride infusion ( Intravenous New Bag/Given 01/04/23 1854)  acetaminophen (TYLENOL) tablet 650 mg (has no administration in time range)    Or  acetaminophen (TYLENOL) suppository 650 mg (has no administration in time range)  oxyCODONE (Oxy IR/ROXICODONE) immediate release tablet 5 mg (has no administration in time range)  docusate sodium (COLACE) capsule 100 mg (100 mg Oral Given 01/05/23 1011)  polyethylene glycol (MIRALAX / GLYCOLAX) packet 17 g (has no administration in time range)  bisacodyl (DULCOLAX) EC tablet 5 mg (has no administration in time range)  ondansetron (ZOFRAN) tablet 4 mg (has no  administration in time range)    Or  ondansetron (ZOFRAN) injection 4 mg (has no administration in time range)  nicotine (NICODERM CQ - dosed in mg/24 hours) patch 14 mg (14 mg Transdermal Patch Applied 01/05/23 1011)  hydrALAZINE (APRESOLINE) injection 5 mg (has no administration in time range)  cefTRIAXone (ROCEPHIN) 2 g in sodium chloride 0.9 % 100 mL IVPB (0 g Intravenous Stopped 01/05/23 0437)  azithromycin (ZITHROMAX) 500 mg in sodium chloride 0.9 % 250 mL IVPB (has no administration in time range)  methylPREDNISolone sodium succinate (SOLU-MEDROL) 125 mg/2 mL injection 120 mg (120 mg Intravenous Given 01/04/23 1749)    Followed by  predniSONE (DELTASONE) tablet 40 mg (has no administration in time range)  ipratropium-albuterol (DUONEB) 0.5-2.5 (3) MG/3ML nebulizer solution 3 mL (3 mLs Nebulization Given 01/05/23 0722)  albuterol (PROVENTIL) (2.5 MG/3ML) 0.083% nebulizer solution 2.5 mg (has no administration in time range)  enoxaparin (LOVENOX) injection 40 mg (40 mg Subcutaneous Given 01/04/23 2113)  sodium chloride flush (NS) 0.9 % injection 3 mL (3 mLs Intravenous Given 01/05/23 1014)  haloperidol lactate (HALDOL)  injection 2-5 mg (has no administration in time range)  amLODipine (NORVASC) tablet 5 mg (5 mg Oral Given 01/05/23 1011)  magnesium sulfate IVPB 2 g 50 mL (0 g Intravenous Stopped 01/04/23 1534)  cefTRIAXone (ROCEPHIN) 1 g in sodium chloride 0.9 % 100 mL IVPB (0 g Intravenous Stopped 01/04/23 1608)  azithromycin (ZITHROMAX) 500 mg in sodium chloride 0.9 % 250 mL IVPB (0 mg Intravenous Stopped 01/04/23 1717)  aspirin chewable tablet 324 mg (324 mg Oral Given 01/04/23 1853)  potassium chloride 10 mEq in 100 mL IVPB (0 mEq Intravenous Stopped 01/04/23 2337)    Mobility walks     Focused Assessments Pulmonary Assessment Handoff:  Lung sounds: Bilateral Breath Sounds: Expiratory wheezes L Breath Sounds: Expiratory wheezes R Breath Sounds: Expiratory wheezes O2 Device: Nasal Cannula O2 Flow Rate (L/min): 5 L/min    R Recommendations: See Admitting Provider Note  Report given to:   Additional Notes:

## 2023-01-05 NOTE — Evaluation (Signed)
Physical Therapy Evaluation Patient Details Name: Brett Vasquez MRN: [id number] DOB: 1945/12/30 Today's Date: 01/05/2023  History of Present Illness  77 y.o. male presents to [facility] hospital on 01/04/2023 with SOB. Pt admitted for chronic respiratory failure associate with COPD exacerbation and LLL PNA. PMH: chronic iron deficiency anemia secondary to GI bleed, COPD, HTN, HLD, coronary hypokalemia, cigarette smoke  Clinical Impression  Pt presents to PT with deficits in endurance, cardiopulmonary function, gait, balance. Pt demonstrates mild lateral drift when ambulating, but otherwise appears to ambulate well. Pt does become tachycardic and tachypneic when ambulating during this session, requiring 4L of supplemental oxygen to maintain sats in low 90s. Pt will benefit from frequent mobilization in an effort to improve activity tolerance and cardiopulmonary function.       Recommendations for follow up therapy are one component of a multi-disciplinary discharge planning process, led by the attending physician.  Recommendations may be updated based on patient status, additional functional criteria and insurance authorization.  Follow Up Recommendations       Assistance Recommended at Discharge PRN  Patient can return home with the following  A little help with bathing/dressing/bathroom;Assistance with cooking/housework;Help with stairs or ramp for entrance    Equipment Recommendations None recommended by PT  Recommendations for Other Services       Functional Status Assessment Patient has had a recent decline in their functional status and demonstrates the ability to make significant improvements in function in a reasonable and predictable amount of time.     Precautions / Restrictions Precautions Precautions: Fall Precaution Comments: tachycardic, tachypneic Restrictions Weight Bearing Restrictions: No      Mobility  Bed Mobility Overal bed mobility: Independent                   Transfers Overall transfer level: Independent Equipment used: None                    Ambulation/Gait Ambulation/Gait assistance: Supervision Gait Distance (Feet): 200 Feet Assistive device: None Gait Pattern/deviations: Step-through pattern, Drifts right/left Gait velocity: reduced Gait velocity interpretation: <1.8 ft/sec, indicate of risk for recurrent falls   General Gait Details: pt with increased lateral drift, no significant losses of balance noted  Stairs            Wheelchair Mobility    Modified Rankin (Stroke Patients Only)       Balance Overall balance assessment: Needs assistance Sitting-balance support: No upper extremity supported, Feet supported Sitting balance-Leahy Scale: Good     Standing balance support: No upper extremity supported, During functional activity Standing balance-Leahy Scale: Fair                               Pertinent Vitals/Pain Pain Assessment Pain Assessment: No/denies pain    Home Living Family/patient expects to be discharged to:: Private residence Living Arrangements: Other relatives Available Help at Discharge: Family;Available PRN/intermittently Type of Home: House Home Access: Stairs to enter Entrance Stairs-Rails: Can reach both Entrance Stairs-Number of Steps: 4   Home Layout: One level Home Equipment: None      Prior Function Prior Level of Function : Independent/Modified Independent                     Hand Dominance   Dominant Hand: Left    Extremity/Trunk Assessment   Upper Extremity Assessment Upper Extremity Assessment: Overall WFL for tasks assessed    Lower Extremity  Assessment Lower Extremity Assessment: Overall WFL for tasks assessed    Cervical / Trunk Assessment Cervical / Trunk Assessment: Normal  Communication   Communication: No difficulties  Cognition Arousal/Alertness: Awake/alert Behavior During Therapy: WFL for tasks  assessed/performed Overall Cognitive Status: Within Functional Limits for tasks assessed                                          General Comments General comments (skin integrity, edema, etc.): pt on 5L [place] at rest, sats from 97-100%. PT weans to 4L with activity, desats to 90%. Pt tachy to 145 when ambulating, PT also notes tachypnea into high 20s    Exercises     Assessment/Plan    PT Assessment Patient needs continued PT services  PT Problem List Decreased activity tolerance;Cardiopulmonary status limiting activity;Decreased mobility       PT Treatment Interventions DME instruction;Gait training;Functional mobility training;Therapeutic activities;Therapeutic exercise;Balance training;Patient/family education    PT Goals (Current goals can be found in the Care Plan section)  Acute Rehab PT Goals Patient Stated Goal: to improve activity tolerance PT Goal Formulation: With patient Time For Goal Achievement: 01/19/23 Potential to Achieve Goals: Good Additional Goals Additional Goal #1: Pt will score >19/24 on the DGI to indicate a reduced risk for falls Additional Goal #2: Pt will report 1/4 DOE or less when ambulating for >500' to demonstrate improved activity tolerance    Frequency Min 3X/week     Co-evaluation               AM-PAC PT "6 Clicks" Mobility  Outcome Measure Help needed turning from your back to your side while in a flat bed without using bedrails?: None Help needed moving from lying on your back to sitting on the side of a flat bed without using bedrails?: None Help needed moving to and from a bed to a chair (including a wheelchair)?: None Help needed standing up from a chair using your arms (e.g., wheelchair or bedside chair)?: None Help needed to walk in hospital room?: A Little Help needed climbing 3-5 steps with a railing? : A Little 6 Click Score: 22    End of Session Equipment Utilized During Treatment: Oxygen Activity  Tolerance: Patient limited by fatigue Patient left: in bed;with call bell/phone within reach Nurse Communication: Mobility status PT Visit Diagnosis: Other abnormalities of gait and mobility (R26.89)    Time: 6045-4098 PT Time Calculation (min) (ACUTE ONLY): 21 min   Charges:   PT Evaluation $PT Eval Low Complexity: 1 Low          Arlyss Gandy, PT, DPT Acute Rehabilitation Office [phone]   Arlyss Gandy 01/05/2023, 10:44 AM

## 2023-01-05 NOTE — Progress Notes (Signed)
Patient resting comfortably on 6L [place] with no respiratory distress noted.  Turned [place] down to 5L due to sats of 100%.  Audible wheezing noted but patient received AM nebulizer treatment.  Bipap ordered for PRN.  Will continue to monitor and assess for bipap needs.

## 2023-01-05 NOTE — Progress Notes (Signed)
OT Cancellation Note  Patient Details Name: Brett Vasquez MRN: [id number] DOB: 1946-09-05   Cancelled Treatment:    Reason Eval/Treat Not Completed: Other (comment) Lunch just being delivered on OT entry. Pt reported he had moved around enough already and requested OT follow up tomorrow.   Lorre Munroe 01/05/2023, 2:06 PM

## 2023-01-05 NOTE — Progress Notes (Signed)
Initial Nutrition Assessment  DOCUMENTATION CODES:   Not applicable  INTERVENTION:   Multivitamin w/ minerals daily Encourage good PO intake Meal ordering with assist  NUTRITION DIAGNOSIS:   Increased nutrient needs related to chronic illness (COPD) as evidenced by estimated needs.  GOAL:   Patient will meet greater than or equal to 90% of their needs  MONITOR:   PO intake, Labs, Weight trends, I & O's  REASON FOR ASSESSMENT:   Consult  (Nutritional Goals)  ASSESSMENT:   77 y.o. male presented to the ED with SOB. PMH include COPD, HTN, and HLD. Pt admitted with COPD exacerbation, LLL PNA, and AKI.   Pt sitting in chair in room. Lunch tray in room, pt reports that he ate fairly well, about half the sandwich. Reports he eats well at home, when he is hungry he eats. Denies any weight changes at home. Denies any needs from RD from a nutrition standpoint. Ok with having someone come up to take meal order.  Medications reviewed and include: Potassium Chloride, Prednisone, IV antibiotics  Labs reviewed: Sodium 139, Potassium 2.8  NUTRITION - FOCUSED PHYSICAL EXAM:  Deferred to follow-up.  Diet Order:   Diet Order             Diet regular Room service appropriate? Yes; Fluid consistency: Thin  Diet effective now                   EDUCATION NEEDS:   No education needs have been identified at this time  Skin:  Skin Assessment: Reviewed RN Assessment  Last BM:  4/20  Height:  Ht Readings from Last 1 Encounters:  01/04/23  (1.676 m)   Weight:  Wt Readings from Last 1 Encounters:  01/04/23 66.5 kg   Ideal Body Weight:  64.6 kg  BMI:  Body mass index is 23.66 kg/m.  Estimated Nutritional Needs:  Kcal:  1700-1900 Protein:  85-100 grams Fluid:  >/= 1.7 L   Kirby Crigler RD, LDN Clinical Dietitian See [facility] for contact information.

## 2023-01-05 NOTE — Progress Notes (Signed)
PROGRESS NOTE    Brett Vasquez  BJY:[id number] DOB: Apr 25, 1946 DOA: 01/04/2023 PCP: Patient, No Pcp Per    Brief Narrative:  77 year old with history of COPD, active smoker, hypertension hyperlipidemia presented to the ER with many days of shortness of breath.  He had presented to ER 1 day prior, recommended admission but declined and went home.  Patient has chronic shortness of breath but this was worse so he came to the ER. At the emergency room, blood pressure stable.  Heart rate 140.  Saturation 100% on breathing treatments.  Initially started on BiPAP due to work of breathing and admitted to the hospital.  Potassium was very low.   Assessment & Plan:   COPD with acute exacerbation: Agree with admission to monitored unit because of severity of symptoms. IV steroids to oral steroids, inhalational steroids, scheduled and as needed bronchodilators, deep breathing exercises, incentive spirometry, chest physiotherapy.  Antibiotics due to severity of symptoms.  On Rocephin and azithromycin that we will continue. Supplemental oxygen to keep saturations more than 92%. BiPAP as needed for respiratory distress.  Acute kidney injury: Due to above.  On maintenance IV fluids.  Improved.  Smoker/nicotine dependence: Encourage cessation.  Nicotine patch declined.  Essential hypertension: Blood pressures stable.  Not taking home medications.  Start with amlodipine.  DVT prophylaxis: enoxaparin (LOVENOX) injection 40 mg Start: 01/04/23 2200   Code Status: DNR, documented Family Communication: None at the bedside Disposition Plan: Status is: Inpatient Remains inpatient appropriate because: Significant shortness of breath     Consultants:  None  Procedures:  None  Antimicrobials:  Rocephin and azithromycin 4/21---   Subjective: Patient seen in the morning rounds.  He desires to go home.  He is obviously short of breath even on talking.  He agreed to stay back until his breathing is  better.  No family at the bedside.  He lives at home with his brother.  Objective: Vitals:   01/05/23 0930 01/05/23 1130 01/05/23 1142 01/05/23 1251  BP: (!) 151/84 (!) 156/87    Pulse: 94 91    Resp: (!) 22 (!) 21    Temp:   99.2 F (37.3 C) 98.6 F (37 C)  TempSrc:   Oral Oral  SpO2: 100% 100%  99%  Weight:      Height:        Intake/Output Summary (Last 24 hours) at 01/05/2023 1319 Last data filed at 01/05/2023 0437 Gross per 24 hour  Intake 400 ml  Output --  Net 400 ml   Filed Weights   01/04/23 1932  Weight: 66.5 kg    Examination:  General exam: Appears chronically sick looking.  Frail and debilitated. Respiratory system: Poor inspiratory effort.SpO2: 99 % O2 Flow Rate (L/min): 4 L/min FiO2 (%): 50 %  Cardiovascular system: S1 & S2 heard, RRR. Gastrointestinal system: Soft and nontender.  Bowel sound present.   Central nervous system: Alert and oriented. No focal neurological deficits. Generalized weakness.  Slight resting tremors.    Data Reviewed: I have personally reviewed following labs and imaging studies  CBC: Recent Labs  Lab 01/03/23 1125 01/04/23 1400 01/04/23 1412 01/05/23 0352  WBC 7.9 18.0*  --  17.0*  NEUTROABS  --  17.1*  --   --   HGB 7.6* 7.4* 9.9* 7.2*  HCT 27.2* 28.2* 29.0* 26.8*  MCV 64.3* 66.5*  --  65.4*  PLT 342 389  --  306   Basic Metabolic Panel: Recent Labs  Lab 01/03/23 1125 01/04/23 1400  01/04/23 1412 01/05/23 0352  NA 139 140 140 139  K 2.2* 2.6* 2.5* 2.8*  CL 101 103  --  100  CO2 24 22  --  25  GLUCOSE 119* 175*  --  130*  BUN 11 22  --  19  CREATININE 0.99 1.32*  --  0.96  CALCIUM 8.8* 8.4*  --  8.7*   GFR: Estimated Creatinine Clearance: 56.3 mL/min (by C-G formula based on SCr of 0.96 mg/dL). Liver Function Tests: No results for input(s): "AST", "ALT", "ALKPHOS", "BILITOT", "PROT", "ALBUMIN" in the last 168 hours. No results for input(s): "LIPASE", "AMYLASE" in the last 168 hours. No results for  input(s): "AMMONIA" in the last 168 hours. Coagulation Profile: No results for input(s): "INR", "PROTIME" in the last 168 hours. Cardiac Enzymes: No results for input(s): "CKTOTAL", "CKMB", "CKMBINDEX", "TROPONINI" in the last 168 hours. BNP (last 3 results) No results for input(s): "PROBNP" in the last 8760 hours. HbA1C: No results for input(s): "HGBA1C" in the last 72 hours. CBG: No results for input(s): "GLUCAP" in the last 168 hours. Lipid Profile: No results for input(s): "CHOL", "HDL", "LDLCALC", "TRIG", "CHOLHDL", "LDLDIRECT" in the last 72 hours. Thyroid Function Tests: No results for input(s): "TSH", "T4TOTAL", "FREET4", "T3FREE", "THYROIDAB" in the last 72 hours. Anemia Panel: No results for input(s): "VITAMINB12", "FOLATE", "FERRITIN", "TIBC", "IRON", "RETICCTPCT" in the last 72 hours. Sepsis Labs: No results for input(s): "PROCALCITON", "LATICACIDVEN" in the last 168 hours.  No results found for this or any previous visit (from the past 240 hour(s)).       Radiology Studies: DG Chest Port 1 View  Result Date: 01/04/2023 CLINICAL DATA:  Dyspnea.  Shortness of breath EXAM: PORTABLE CHEST 1 VIEW COMPARISON:  X-ray 01/03/2023 and older FINDINGS: Hyperinflation. No pneumothorax or effusion. Chronic lung changes. There is some asymmetric increased density at the left lung base. A subtle acute process is possible recommend follow-up. Borderline cardiopericardial silhouette. Tortuous ectatic aorta. Overlapping cardiac leads. IMPRESSION: Subtle opacity left lung base. An acute process is possible. Recommend follow-up. Hyperinflation with underlying chronic lung disease Electronically Signed   By: Karen Kays M.D.   On: 01/04/2023 13:49        Scheduled Meds:  amLODipine  5 mg Oral Daily   docusate sodium  100 mg Oral BID   enoxaparin (LOVENOX) injection  40 mg Subcutaneous Q24H   ipratropium-albuterol  3 mL Nebulization Q6H   mometasone-formoterol  2 puff Inhalation BID    nicotine  14 mg Transdermal Daily   potassium chloride  40 mEq Oral BID   predniSONE  40 mg Oral Q breakfast   sodium chloride flush  3 mL Intravenous Q12H   Continuous Infusions:  azithromycin     cefTRIAXone (ROCEPHIN)  IV Stopped (01/05/23 0437)     LOS: 1 day    Time spent: 35 minutes    Dorcas Carrow, MD [facility] Pager [phone]

## 2023-01-05 NOTE — Progress Notes (Addendum)
  Transition of Care [facility]) Screening Note   Patient Details  Name: Brett Vasquez Date of Birth: 1946-08-02   Transition of Care [facility]) CM/SW Contact:    Kermit Balo, RN Phone Number: 01/05/2023, 3:57 PM   Pt is from home with relatives. PCP: [facility]. No f/u per PT. Transition of Care Department [facility]) has reviewed patient. We will continue to monitor patient advancement through interdisciplinary progression rounds. If new patient transition needs arise, please place a TOC consult.

## 2023-01-06 ENCOUNTER — Inpatient Hospital Stay (HOSPITAL_COMMUNITY): Payer: Medicare Other

## 2023-01-06 DIAGNOSIS — J441 Chronic obstructive pulmonary disease with (acute) exacerbation: Secondary | ICD-10-CM | POA: Diagnosis not present

## 2023-01-06 LAB — CBC WITH DIFFERENTIAL/PLATELET
Abs Immature Granulocytes: 0.06 10*3/uL (ref 0.00–0.07)
Basophils Absolute: 0 10*3/uL (ref 0.0–0.1)
Basophils Relative: 0 %
Eosinophils Absolute: 0 10*3/uL (ref 0.0–0.5)
Eosinophils Relative: 0 %
HCT: 27.1 % — ABNORMAL LOW (ref 39.0–52.0)
Hemoglobin: 7.7 g/dL — ABNORMAL LOW (ref 13.0–17.0)
Immature Granulocytes: 0 %
Lymphocytes Relative: 5 %
Lymphs Abs: 0.8 10*3/uL (ref 0.7–4.0)
MCH: 18.2 pg — ABNORMAL LOW (ref 26.0–34.0)
MCHC: 28.4 g/dL — ABNORMAL LOW (ref 30.0–36.0)
MCV: 63.9 fL — ABNORMAL LOW (ref 80.0–100.0)
Monocytes Absolute: 0.5 10*3/uL (ref 0.1–1.0)
Monocytes Relative: 3 %
Neutro Abs: 13.7 10*3/uL — ABNORMAL HIGH (ref 1.7–7.7)
Neutrophils Relative %: 92 %
Platelets: 357 10*3/uL (ref 150–400)
RBC: 4.24 MIL/uL (ref 4.22–5.81)
RDW: 20.3 % — ABNORMAL HIGH (ref 11.5–15.5)
WBC: 15 10*3/uL — ABNORMAL HIGH (ref 4.0–10.5)
nRBC: 0 % (ref 0.0–0.2)

## 2023-01-06 LAB — IRON AND TIBC
Iron: <5 ug/dL — ABNORMAL LOW (ref 45–182)
TIBC: 421 ug/dL (ref 250–450)

## 2023-01-06 LAB — RETICULOCYTES
Immature Retic Fract: 32.8 % — ABNORMAL HIGH (ref 2.3–15.9)
RBC.: 4.32 MIL/uL (ref 4.22–5.81)
Retic Count, Absolute: 56.6 10*3/uL (ref 19.0–186.0)
Retic Ct Pct: 1.3 % (ref 0.4–3.1)

## 2023-01-06 LAB — BRAIN NATRIURETIC PEPTIDE: B Natriuretic Peptide: 414.1 pg/mL — ABNORMAL HIGH (ref 0.0–100.0)

## 2023-01-06 LAB — FERRITIN: Ferritin: 10 ng/mL — ABNORMAL LOW (ref 24–336)

## 2023-01-06 LAB — COMPREHENSIVE METABOLIC PANEL
ALT: 31 U/L (ref 0–44)
AST: 53 U/L — ABNORMAL HIGH (ref 15–41)
Albumin: 3.4 g/dL — ABNORMAL LOW (ref 3.5–5.0)
Alkaline Phosphatase: 74 U/L (ref 38–126)
Anion gap: 11 (ref 5–15)
BUN: 23 mg/dL (ref 8–23)
CO2: 26 mmol/L (ref 22–32)
Calcium: 9 mg/dL (ref 8.9–10.3)
Chloride: 103 mmol/L (ref 98–111)
Creatinine, Ser: 0.81 mg/dL (ref 0.61–1.24)
GFR, Estimated: >60 mL/min (ref >60)
Glucose, Bld: 100 mg/dL — ABNORMAL HIGH (ref 70–99)
Potassium: 3.2 mmol/L — ABNORMAL LOW (ref 3.5–5.1)
Sodium: 140 mmol/L (ref 135–145)
Total Bilirubin: 0.7 mg/dL (ref 0.3–1.2)
Total Protein: 6.2 g/dL — ABNORMAL LOW (ref 6.5–8.1)

## 2023-01-06 LAB — LEGIONELLA PNEUMOPHILA SEROGP 1 UR AG: L. pneumophila Serogp 1 Ur Ag: NEGATIVE

## 2023-01-06 LAB — C-REACTIVE PROTEIN: CRP: 6.7 mg/dL — ABNORMAL HIGH (ref <1.0)

## 2023-01-06 LAB — MAGNESIUM: Magnesium: 2.1 mg/dL (ref 1.7–2.4)

## 2023-01-06 LAB — CULTURE, BLOOD (ROUTINE X 2): Culture: NO GROWTH

## 2023-01-06 LAB — FOLATE: Folate: 11.3 ng/mL (ref >5.9)

## 2023-01-06 LAB — PROCALCITONIN: Procalcitonin: 3.15 ng/mL

## 2023-01-06 LAB — VITAMIN B12: Vitamin B-12: 301 pg/mL (ref 180–914)

## 2023-01-06 MED ORDER — CARVEDILOL 3.125 MG PO TABS
3.1250 mg | ORAL_TABLET | Freq: Two times a day (BID) | ORAL | Status: DC
  Administered 2023-01-06 – 2023-01-07 (×3): 3.125 mg via ORAL
  Filled 2023-01-06 (×3): qty 1

## 2023-01-06 MED ORDER — PREDNISONE 5 MG PO TABS
50.0000 mg | ORAL_TABLET | Freq: Every day | ORAL | Status: DC
  Administered 2023-01-07: 50 mg via ORAL
  Filled 2023-01-06: qty 2

## 2023-01-06 MED ORDER — FERROUS SULFATE 325 (65 FE) MG PO TABS
325.0000 mg | ORAL_TABLET | Freq: Two times a day (BID) | ORAL | Status: DC
  Administered 2023-01-06 – 2023-01-10 (×8): 325 mg via ORAL
  Filled 2023-01-06 (×8): qty 1

## 2023-01-06 MED ORDER — FUROSEMIDE 10 MG/ML IJ SOLN
40.0000 mg | Freq: Once | INTRAMUSCULAR | Status: AC
  Administered 2023-01-06: 40 mg via INTRAVENOUS
  Filled 2023-01-06: qty 4

## 2023-01-06 MED ORDER — LEVOFLOXACIN 750 MG PO TABS
750.0000 mg | ORAL_TABLET | Freq: Every day | ORAL | Status: DC
  Administered 2023-01-06 – 2023-01-07 (×2): 750 mg via ORAL
  Filled 2023-01-06 (×2): qty 1

## 2023-01-06 MED ORDER — FOLIC ACID 1 MG PO TABS
1.0000 mg | ORAL_TABLET | Freq: Every day | ORAL | Status: DC
  Administered 2023-01-06 – 2023-01-10 (×5): 1 mg via ORAL
  Filled 2023-01-06 (×5): qty 1

## 2023-01-06 MED ORDER — PANTOPRAZOLE SODIUM 40 MG PO TBEC
40.0000 mg | DELAYED_RELEASE_TABLET | Freq: Every day | ORAL | Status: DC
  Administered 2023-01-06 – 2023-01-10 (×5): 40 mg via ORAL
  Filled 2023-01-06 (×5): qty 1

## 2023-01-06 NOTE — Evaluation (Signed)
Occupational Therapy Evaluation Patient Details Name: Brett Vasquez MRN: [id number] DOB: [birth year] Today's Date: 01/06/2023   History of Present Illness 77 y.o. male presents to [facility] hospital on 01/04/2023 with SOB. Pt admitted for chronic respiratory failure associate with COPD exacerbation and LLL PNA. PMH: chronic iron deficiency anemia secondary to GI bleed, COPD, HTN, HLD, coronary hypokalemia, cigarette smoke   Clinical Impression   At baseline PLOF pt completed ADLs and IADLs Independent to Mod I, performed functional mobility Independently without an AD and was driving. Pt not presents with decreased general UE strength and decreased activity tolerance. Pt currently requires Set up to Min guard assist for UB ADLs and Min guard assist for LB ADLs. Pt currently performs functional transfers and mobility with Mod I to Supervision. Pt will benefit from acute skilled OT services to increase general UE strength, activity tolerance, and safety and independence with ADLs. Post discharge, pt will benefit from use of a shower chair in the home. No additional skilled OT services indicated at this time post acute discharge.      Recommendations for follow up therapy are one component of a multi-disciplinary discharge planning process, led by the attending physician.  Recommendations may be updated based on patient status, additional functional criteria and insurance authorization.   Assistance Recommended at Discharge PRN  Patient can return home with the following A little help with bathing/dressing/bathroom;A little help with walking and/or transfers;Assistance with cooking/housework;Assist for transportation;Help with stairs or ramp for entrance    Functional Status Assessment  Patient has had a recent decline in their functional status and demonstrates the ability to make significant improvements in function in a reasonable and predictable amount of time.  Equipment Recommendations  Tub/shower  seat    Recommendations for Other Services       Precautions / Restrictions Precautions Precautions: Fall Precaution Comments: tachycardic, tachypneic Restrictions Weight Bearing Restrictions: No      Mobility Bed Mobility Overal bed mobility: Independent                  Transfers Overall transfer level: Needs assistance Equipment used: None Transfers: Sit to/from Stand, Bed to chair/wheelchair/BSC Sit to Stand: Modified independent (Device/Increase time) (Increased time) Stand pivot transfers: Supervision                Balance Overall balance assessment: Needs assistance Sitting-balance support: No upper extremity supported, Feet supported Sitting balance-Leahy Scale: Good     Standing balance support: No upper extremity supported, During functional activity Standing balance-Leahy Scale: Fair                             ADL either performed or assessed with clinical judgement   ADL Overall ADL's : Needs assistance/impaired Eating/Feeding: Independent   Grooming: Sitting;Set up   Upper Body Bathing: Sitting;Min guard   Lower Body Bathing: Min guard;Cueing for safety;Sit to/from stand   Upper Body Dressing : Set up;Sitting   Lower Body Dressing: Min guard;Sit to/from stand   Toilet Transfer: Min guard;Ambulation;Regular Social worker and Hygiene: Min guard;Sit to/from stand       Functional mobility during ADLs: Min guard;Cueing for safety General ADL Comments: Pt with decreased activity tolerance affecting funcitonal level with ADLs.     Vision Baseline Vision/History: 1 Wears glasses (Pt reports he wears progressives, but he needs a new pair as his broke.) Ability to See in Adequate Light: 0 Adequate Patient Visual Report:  No change from baseline Vision Assessment?: No apparent visual deficits (with glasses)     Perception     Praxis Praxis Praxis tested?: Within functional limits     Pertinent Vitals/Pain Pain Assessment Pain Assessment: No/denies pain Faces Pain Scale: No hurt     Hand Dominance Left   Extremity/Trunk Assessment Upper Extremity Assessment Upper Extremity Assessment: Generalized weakness   Lower Extremity Assessment Lower Extremity Assessment: Defer to PT evaluation       Communication Communication Communication: No difficulties   Cognition Arousal/Alertness: Awake/alert Behavior During Therapy: WFL for tasks assessed/performed Overall Cognitive Status: Within Functional Limits for tasks assessed                                       General Comments  VSS on 4L continuous O2 through nasal canula    Exercises     Shoulder Instructions      Home Living Family/patient expects to be discharged to:: Private residence Living Arrangements: Other relatives Available Help at Discharge: Family;Available PRN/intermittently Type of Home: House Home Access: Stairs to enter [employer] of Steps: 4 Entrance Stairs-Rails: Can reach both Home Layout: One level     Bathroom Shower/Tub: Chief Strategy Officer: Standard     Home Equipment: Cane - single point;Other (comment) (Pt reports he has a SPC but does not use it. No other equipment reported.)          Prior Functioning/Environment Prior Level of Function : Independent/Modified Independent;Driving                        OT Problem List: Decreased strength;Decreased activity tolerance;Impaired balance (sitting and/or standing);Cardiopulmonary status limiting activity      OT Treatment/Interventions: Self-care/ADL training;Therapeutic exercise;Energy conservation;Therapeutic activities;Patient/family education;Balance training    OT Goals(Current goals can be found in the care plan section) Acute Rehab OT Goals Patient Stated Goal: Pt want to return home at [facility]. OT Goal Formulation: With patient Time For Goal Achievement:  01/14/23 Potential to Achieve Goals: Good ADL Goals Pt Will Perform Grooming: Independently;standing Pt Will Perform Lower Body Dressing: with modified independence;sit to/from stand Pt Will Transfer to Toilet: with modified independence;regular height toilet;ambulating Pt Will Perform Toileting - Clothing Manipulation and hygiene: with modified independence;sit to/from stand Pt/caregiver will Perform Home Exercise Program: Increased strength;Both right and left upper extremity;With theraband;With written HEP provided;Independently (Increase activity tolerance) Additional ADL Goal #1: Pt will Independently state three energy conservation techniques for increased safety and independence with functional tasks.  OT Frequency: Min 2X/week    Co-evaluation              AM-PAC OT "6 Clicks" Daily Activity     Outcome Measure Help from another person eating meals?: None Help from another person taking care of personal grooming?: A Little Help from another person toileting, which includes using toliet, bedpan, or urinal?: A Little Help from another person bathing (including washing, rinsing, drying)?: A Little Help from another person to put on and taking off regular upper body clothing?: A Little Help from another person to put on and taking off regular lower body clothing?: A Little 6 Click Score: 19   End of Session Equipment Utilized During Treatment: Gait belt;Oxygen Nurse Communication: Mobility status  Activity Tolerance: Patient tolerated treatment well Patient left: in chair;with call bell/phone within reach;with chair alarm set  OT Visit Diagnosis: Unsteadiness  on feet (R26.81);Muscle weakness (generalized) (M62.81)                Time: 0981-1914 OT Time Calculation (min): 27 min Charges:  OT General Charges $OT Visit: 1 Visit OT Evaluation $OT Eval Low Complexity: 1 Low  Jakhari Space "Kyle" M., OTR/L, MA Acute Rehab [phone]   [name] 01/06/2023, 4:18 PM

## 2023-01-06 NOTE — Plan of Care (Signed)

## 2023-01-06 NOTE — Progress Notes (Signed)
Pt resting comfortably on 3L [place] at this time. Bipap not indicated.

## 2023-01-06 NOTE — Evaluation (Signed)
Clinical/Bedside Swallow Evaluation Patient Details  Name: Brett Vasquez MRN: [id number] Date of Birth: 04-27-1946  Today's Date: 01/06/2023 Time: SLP Start Time (ACUTE ONLY): 1348 SLP Stop Time (ACUTE ONLY): 1408 SLP Time Calculation (min) (ACUTE ONLY): 20 min  Past Medical History:  Past Medical History:  Diagnosis Date   Anemia    COPD (chronic obstructive pulmonary disease)    High cholesterol    Hypertension    Smoker unmotivated to quit    Past Surgical History:  Past Surgical History:  Procedure Laterality Date   COLONOSCOPY WITH PROPOFOL N/A 06/01/2022   Procedure: COLONOSCOPY WITH PROPOFOL;  Surgeon: Imogene Burn, MD;  Location: [facility] ENDOSCOPY;  Service: Gastroenterology;  Laterality: N/A;   ESOPHAGOGASTRODUODENOSCOPY (EGD) WITH PROPOFOL N/A 05/31/2022   Procedure: ESOPHAGOGASTRODUODENOSCOPY (EGD) WITH PROPOFOL;  Surgeon: Imogene Burn, MD;  Location: [facility] ENDOSCOPY;  Service: Gastroenterology;  Laterality: N/A;   POLYPECTOMY  06/01/2022   Procedure: POLYPECTOMY;  Surgeon: Imogene Burn, MD;  Location: [facility] ENDOSCOPY;  Service: Gastroenterology;;   HPI:  77 y.o. male presents to [facility] hospital on 01/04/2023 with SOB. Pt admitted for chronic respiratory failure associate with COPD exacerbation and LLL PNA. PMH: chronic iron deficiency anemia secondary to GI bleed, COPD, HTN, HLD, coronary hypokalemia, cigarette smoke    Assessment / Plan / Recommendation  Clinical Impression  Pt's oropharyngeal swallow appears to be [facility] without overt evidence to suggest dysphagia or aspiration. Pt denies having had any episodes of PNA in the past. Education was provided about connection between breathing and swallowing and potential correlation with PNA. MBS was offered as an option to more directly assess swallowing but pt declienes at this time. SLP will leave on regular solids and thin liquids and s/o at this time. SLP Visit Diagnosis: Dysphagia, unspecified (R13.10)    Aspiration Risk  Mild  aspiration risk    Diet Recommendation Regular;Thin liquid   Liquid Administration via: Cup;Straw Medication Administration: Whole meds with liquid (pt says large pills sometimes feel like they clear slowly from his esophagus; could try in puree (whole or crushed) per pt preference) Supervision: Patient able to self feed Compensations: Slow rate;Small sips/bites Postural Changes: Seated upright at 90 degrees    Other  Recommendations Oral Care Recommendations: Oral care BID    Recommendations for follow up therapy are one component of a multi-disciplinary discharge planning process, led by the attending physician.  Recommendations may be updated based on patient status, additional functional criteria and insurance authorization.  Follow up Recommendations No SLP follow up      Assistance Recommended at Discharge    Functional Status Assessment Patient has not had a recent decline in their functional status  Frequency and Duration            Prognosis        Swallow Study   General HPI: 77 y.o. male presents to [facility] hospital on 01/04/2023 with SOB. Pt admitted for chronic respiratory failure associate with COPD exacerbation and LLL PNA. PMH: chronic iron deficiency anemia secondary to GI bleed, COPD, HTN, HLD, coronary hypokalemia, cigarette smoke Type of Study: Bedside Swallow Evaluation Previous Swallow Assessment: none in chart Diet Prior to this Study: Regular;Thin liquids (Level 0) Temperature Spikes Noted: No Respiratory Status: Nasal cannula History of Recent Intubation: No Behavior/Cognition: Alert;Cooperative Oral Cavity Assessment: Within Functional Limits Oral Care Completed by SLP: No Oral Cavity - Dentition: Edentulous (does not wear dentures) Vision: Functional for self-feeding Self-Feeding Abilities: Able to feed self Patient Positioning: Other (comment) (  EOB) Baseline Vocal Quality: Normal Volitional Cough: Strong Volitional Swallow: Able to elicit     Oral/Motor/Sensory Function Overall Oral Motor/Sensory Function: Within functional limits   Ice Chips Ice chips: Not tested   Thin Liquid Thin Liquid: Within functional limits Presentation: Cup;Self Fed;Straw    Nectar Thick Nectar Thick Liquid: Not tested   Honey Thick Honey Thick Liquid: Not tested   Puree Puree: Within functional limits Presentation: Spoon;Self Fed   Solid     Solid: Within functional limits Presentation: Self Fed      Mahala Menghini., M.A. CCC-SLP Acute Rehabilitation Services Office [phone]  Secure chat preferred  01/06/2023,2:13 PM

## 2023-01-06 NOTE — Progress Notes (Signed)
PROGRESS NOTE                                                                                                                                                                                                             Patient Demographics:    Brett Vasquez, is a 77 y.o. male, DOB - [birth year], ZOX:[id number]  Outpatient Primary MD for the patient is Patient, No Pcp Per    LOS - 2  Admit date - 01/04/2023    Chief Complaint  Patient presents with   Shortness of Breath       Brief Narrative (HPI from H&P)   77 year old with history of COPD, active smoker, hypertension hyperlipidemia presented to the ER with many days of shortness of breath.  In the ER he was diagnosed with acute hypoxic respiratory failure due to combination of pneumonia and COPD exacerbation and admitted to the hospital.   Subjective:    Brett Vasquez today has, No headache, No chest pain, No abdominal pain - No Nausea, No new weakness tingling or numbness, +ve SOB.   Assessment  & Plan :    Acute on chronic hypoxic respiratory failure caused by strep pneumonia community-acquired pneumonia and COPD exacerbation in a patient with underlying COPD and still smoking. He initially required BiPAP, currently on IV steroids, empiric antibiotics along with diuretics, currently off of BiPAP and on 4 L nasal cannula oxygen, overall tenuous strictly counseled to quit alcohol.  Encouraged to sit in chair use I-S and flutter valve for pulmonary toiletry and daytime.  Advance activity titrate down oxygen and monitor cultures.  So far strep pneumo urinary antigen is positive.  Was threatening to leave AMA but counseled to stay.   Ongoing smoking.  Counseled to quit.  Acute on chronic diastolic CHF.  Diuresed with IV Lasix, has edema, add TED stockings, repeat echo ordered last echo is from 2019.  Hypertension.  Currently on Norvasc, add low-dose Coreg and  monitor.  Microcytic anemia.  Present on admission, placed on PPI, anemia panel, placed on oral ferrous sulfate and folic acid, age-appropriate outpatient workup to be done by PCP postdischarge.  Type screen done monitor.  AKI.  Resolved after hydration.  Hypokalemia.  Replaced.      Condition - Extremely Guarded  Family Communication  :  None present  Code Status :  DNR  Consults  :  None  PUD Prophylaxis :     Procedures  :     CT      Disposition Plan  :    Status is: Inpatient   DVT Prophylaxis  :    Place TED hose Start: 01/06/23 1014 enoxaparin (LOVENOX) injection 40 mg Start: 01/04/23 2200     Lab Results  Component Value Date   PLT 357 01/06/2023    Diet :  Diet Order             Diet regular Room service appropriate? Yes; Fluid consistency: Thin  Diet effective now                    Inpatient Medications  Scheduled Meds:  amLODipine  5 mg Oral Daily   docusate sodium  100 mg Oral BID   enoxaparin (LOVENOX) injection  40 mg Subcutaneous Q24H   ferrous sulfate  325 mg Oral BID WC   folic acid  1 mg Oral Daily   furosemide  40 mg Intravenous Once   ipratropium-albuterol  3 mL Nebulization Q6H   levofloxacin  750 mg Oral Daily   mometasone-formoterol  2 puff Inhalation BID   multivitamin with minerals  1 tablet Oral Daily   nicotine  14 mg Transdermal Daily   potassium chloride  40 mEq Oral BID   predniSONE  40 mg Oral Q breakfast   sodium chloride flush  3 mL Intravenous Q12H   Continuous Infusions:  cefTRIAXone (ROCEPHIN)  IV 2 g (01/06/23 0430)   PRN Meds:.acetaminophen **OR** acetaminophen, albuterol, bisacodyl, haloperidol lactate, hydrALAZINE, ondansetron **OR** ondansetron (ZOFRAN) IV, oxyCODONE, polyethylene glycol    Objective:   Vitals:   01/06/23 0400 01/06/23 0800 01/06/23 0820 01/06/23 0822  BP: (!) 152/99     Pulse: 92     Resp: 20     Temp: 98.2 F (36.8 C) 98.2 F (36.8 C)    TempSrc: Oral Oral    SpO2:  98% 99% 100% 100%  Weight:      Height:        Wt Readings from Last 3 Encounters:  01/04/23 66.5 kg  06/01/22 66.5 kg  04/04/22 65.8 kg     Intake/Output Summary (Last 24 hours) at 01/06/2023 1027 Last data filed at 01/06/2023 0600 Gross per 24 hour  Intake --  Output 500 ml  Net -500 ml     Physical Exam  Awake Alert, No new F.N deficits, Normal affect .AT,PERRAL Supple Neck, No JVD,   Symmetrical Chest wall movement, minimal air movement bilaterally, +ve rales, 1 + leg edema RRR,No Gallops,Rubs or new Murmurs,  +ve B.Sounds, Abd Soft, No tenderness,   No Cyanosis, Clubbing          Data Review:    Recent Labs  Lab 01/03/23 1125 01/04/23 1400 01/04/23 1412 01/05/23 0352 01/06/23 0718  WBC 7.9 18.0*  --  17.0* 15.0*  HGB 7.6* 7.4* 9.9* 7.2* 7.7*  HCT 27.2* 28.2* 29.0* 26.8* 27.1*  PLT 342 389  --  306 357  MCV 64.3* 66.5*  --  65.4* 63.9*  MCH 18.0* 17.5*  --  17.6* 18.2*  MCHC 27.9* 26.2*  --  26.9* 28.4*  RDW 20.0* 20.1*  --  19.9* 20.3*  LYMPHSABS  --  0.5*  --   --  0.8  MONOABS  --  0.4  --   --  0.5  EOSABS  --  0.0  --   --  0.0  BASOSABS  --  0.0  --   --  0.0    Recent Labs  Lab 01/03/23 1125 01/04/23 1400 01/04/23 1412 01/05/23 0352 01/06/23 0718  NA 139 140 140 139 140  K 2.2* 2.6* 2.5* 2.8* 3.2*  CL 101 103  --  100 103  CO2 24 22  --  25 26  ANIONGAP 14 15  --  14 11  GLUCOSE 119* 175*  --  130* 100*  BUN 11 22  --  19 23  CREATININE 0.99 1.32*  --  0.96 0.81  AST  --   --   --   --  53*  ALT  --   --   --   --  31  ALKPHOS  --   --   --   --  74  BILITOT  --   --   --   --  0.7  ALBUMIN  --   --   --   --  3.4*  CRP  --   --   --   --  6.7*  PROCALCITON  --   --   --   --  3.15  BNP  --  380.2*  --   --  414.1*  MG  --   --   --   --  2.1  CALCIUM 8.8* 8.4*  --  8.7* 9.0      Recent Labs  Lab 01/03/23 1125 01/04/23 1400 01/05/23 0352 01/06/23 0718  CRP  --   --   --  6.7*  PROCALCITON  --   --   --  3.15  BNP   --  380.2*  --  414.1*  MG  --   --   --  2.1  CALCIUM 8.8* 8.4* 8.7* 9.0     Radiology Reports DG Chest Port 1 View  Result Date: 01/06/2023 CLINICAL DATA:  Shortness of breath.  COPD. EXAM: PORTABLE CHEST 1 VIEW COMPARISON:  01/04/2023 FINDINGS: Heart size and mediastinal contours are stable. Lungs are hyperinflated. Advanced changes of emphysema. Similar appearance of left basilar scarring with surrounding architectural distortion and slight asymmetric elevation of the left hemidiaphragm. No superimposed pleural effusion or interstitial edema. Subtle opacity scratch set superimposed opacity within the left base may represent atelectasis or pneumonia. Visualized osseous structures are unremarkable. IMPRESSION: 1. Subtle opacity within the left base may represent atelectasis or pneumonia. 2. Advanced changes of emphysema. 3. Chronic left basilar scarring. Electronically Signed   By: Signa Kell M.D.   On: 01/06/2023 08:26   DG Chest Port 1 View  Result Date: 01/04/2023 CLINICAL DATA:  Dyspnea.  Shortness of breath EXAM: PORTABLE CHEST 1 VIEW COMPARISON:  X-ray 01/03/2023 and older FINDINGS: Hyperinflation. No pneumothorax or effusion. Chronic lung changes. There is some asymmetric increased density at the left lung base. A subtle acute process is possible recommend follow-up. Borderline cardiopericardial silhouette. Tortuous ectatic aorta. Overlapping cardiac leads. IMPRESSION: Subtle opacity left lung base. An acute process is possible. Recommend follow-up. Hyperinflation with underlying chronic lung disease Electronically Signed   By: Karen Kays M.D.   On: 01/04/2023 13:49   DG Chest 2 View  Result Date: 01/03/2023 CLINICAL DATA:  77 year old male with shortness of breath. EXAM: CHEST - 2 VIEW COMPARISON:  Chest radiographs 05/29/2022 and earlier. FINDINGS: Chronically tortuous aortic arch and emphysema demonstrated on prior CTA. Lung volumes and mediastinal contours are stable from last  year. Visualized tracheal air column is within normal limits.  No pneumothorax, pulmonary edema, pleural effusion or acute pulmonary opacity. No acute osseous abnormality identified. Negative visible bowel gas. IMPRESSION: 1. No acute cardiopulmonary abnormality. 2. Chronic Emphysema (ICD10-J43.9), tortuous aortic arch. Electronically Signed   By: Odessa Fleming M.D.   On: 01/03/2023 11:51      Signature  -   Susa Raring M.D on 01/06/2023 at 10:27 AM   -  To page go to [email or web address]

## 2023-01-07 ENCOUNTER — Inpatient Hospital Stay (HOSPITAL_COMMUNITY): Payer: Medicare Other

## 2023-01-07 DIAGNOSIS — J441 Chronic obstructive pulmonary disease with (acute) exacerbation: Secondary | ICD-10-CM | POA: Diagnosis not present

## 2023-01-07 DIAGNOSIS — R079 Chest pain, unspecified: Secondary | ICD-10-CM | POA: Diagnosis not present

## 2023-01-07 LAB — CULTURE, BLOOD (ROUTINE X 2): Special Requests: ADEQUATE

## 2023-01-07 LAB — ECHOCARDIOGRAM COMPLETE
AR max vel: 3.93 cm2
AV Area VTI: 3.35 cm2
AV Area mean vel: 3.75 cm2
AV Mean grad: 3 mmHg
AV Peak grad: 5.9 mmHg
Ao pk vel: 1.21 m/s
Area-P 1/2: 4.89 cm2
Height: 66 in
MV VTI: 3.78 cm2
S' Lateral: 3.7 cm
Weight: 2345.69 oz

## 2023-01-07 LAB — CBC WITH DIFFERENTIAL/PLATELET
Abs Immature Granulocytes: 0.05 10*3/uL (ref 0.00–0.07)
Basophils Absolute: 0 10*3/uL (ref 0.0–0.1)
Basophils Relative: 0 %
Eosinophils Absolute: 0 10*3/uL (ref 0.0–0.5)
Eosinophils Relative: 0 %
HCT: 26.7 % — ABNORMAL LOW (ref 39.0–52.0)
Hemoglobin: 7.2 g/dL — ABNORMAL LOW (ref 13.0–17.0)
Immature Granulocytes: 0 %
Lymphocytes Relative: 12 %
Lymphs Abs: 1.5 10*3/uL (ref 0.7–4.0)
MCH: 17.6 pg — ABNORMAL LOW (ref 26.0–34.0)
MCHC: 27 g/dL — ABNORMAL LOW (ref 30.0–36.0)
MCV: 65.3 fL — ABNORMAL LOW (ref 80.0–100.0)
Monocytes Absolute: 0.5 10*3/uL (ref 0.1–1.0)
Monocytes Relative: 4 %
Neutro Abs: 10.5 10*3/uL — ABNORMAL HIGH (ref 1.7–7.7)
Neutrophils Relative %: 84 %
Platelets: 333 10*3/uL (ref 150–400)
RBC: 4.09 MIL/uL — ABNORMAL LOW (ref 4.22–5.81)
RDW: 19.9 % — ABNORMAL HIGH (ref 11.5–15.5)
WBC: 12.6 10*3/uL — ABNORMAL HIGH (ref 4.0–10.5)
nRBC: 0 % (ref 0.0–0.2)

## 2023-01-07 LAB — BASIC METABOLIC PANEL
Anion gap: 13 (ref 5–15)
BUN: 26 mg/dL — ABNORMAL HIGH (ref 8–23)
CO2: 29 mmol/L (ref 22–32)
Calcium: 9 mg/dL (ref 8.9–10.3)
Chloride: 99 mmol/L (ref 98–111)
Creatinine, Ser: 0.86 mg/dL (ref 0.61–1.24)
GFR, Estimated: >60 mL/min (ref >60)
Glucose, Bld: 88 mg/dL (ref 70–99)
Potassium: 2.8 mmol/L — ABNORMAL LOW (ref 3.5–5.1)
Sodium: 141 mmol/L (ref 135–145)

## 2023-01-07 LAB — PROCALCITONIN: Procalcitonin: 2.23 ng/mL

## 2023-01-07 LAB — MAGNESIUM: Magnesium: 1.7 mg/dL (ref 1.7–2.4)

## 2023-01-07 LAB — BRAIN NATRIURETIC PEPTIDE: B Natriuretic Peptide: 236.7 pg/mL — ABNORMAL HIGH (ref 0.0–100.0)

## 2023-01-07 LAB — C-REACTIVE PROTEIN: CRP: 3.7 mg/dL — ABNORMAL HIGH (ref <1.0)

## 2023-01-07 MED ORDER — POTASSIUM CHLORIDE 10 MEQ/100ML IV SOLN
10.0000 meq | INTRAVENOUS | Status: AC
  Administered 2023-01-07 (×3): 10 meq via INTRAVENOUS
  Filled 2023-01-07 (×3): qty 100

## 2023-01-07 MED ORDER — AMLODIPINE BESYLATE 10 MG PO TABS: 10.0000 mg | ORAL_TABLET | Freq: Every day | ORAL | Status: DC

## 2023-01-07 MED ORDER — ISOSORBIDE MONONITRATE ER 30 MG PO TB24
15.0000 mg | ORAL_TABLET | Freq: Every day | ORAL | Status: DC
  Administered 2023-01-07: 15 mg via ORAL
  Filled 2023-01-07: qty 1

## 2023-01-07 MED ORDER — LEVOFLOXACIN 750 MG PO TABS
750.0000 mg | ORAL_TABLET | Freq: Every day | ORAL | Status: AC
  Administered 2023-01-08 – 2023-01-10 (×3): 750 mg via ORAL
  Filled 2023-01-07 (×3): qty 1

## 2023-01-07 MED ORDER — FUROSEMIDE 10 MG/ML IJ SOLN
40.0000 mg | Freq: Once | INTRAMUSCULAR | Status: AC
  Administered 2023-01-07: 40 mg via INTRAVENOUS
  Filled 2023-01-07: qty 4

## 2023-01-07 MED ORDER — ADULT MULTIVITAMIN W/MINERALS CH
1.0000 | ORAL_TABLET | Freq: Every day | ORAL | Status: DC
  Administered 2023-01-09: 1 via ORAL
  Filled 2023-01-07: qty 1

## 2023-01-07 MED ORDER — PREDNISONE 5 MG PO TABS
30.0000 mg | ORAL_TABLET | Freq: Every day | ORAL | Status: DC
  Administered 2023-01-08: 30 mg via ORAL
  Filled 2023-01-07: qty 2

## 2023-01-07 MED ORDER — HYDRALAZINE HCL 20 MG/ML IJ SOLN
10.0000 mg | Freq: Four times a day (QID) | INTRAMUSCULAR | Status: DC | PRN
  Administered 2023-01-09: 10 mg via INTRAVENOUS
  Filled 2023-01-07: qty 1

## 2023-01-07 MED ORDER — AMLODIPINE BESYLATE 5 MG PO TABS: 5.0000 mg | ORAL_TABLET | Freq: Once | ORAL | Status: DC

## 2023-01-07 MED ORDER — AMLODIPINE BESYLATE 5 MG PO TABS
5.0000 mg | ORAL_TABLET | Freq: Every day | ORAL | Status: DC
  Administered 2023-01-08 – 2023-01-09 (×2): 5 mg via ORAL
  Filled 2023-01-07 (×2): qty 1

## 2023-01-07 MED ORDER — POTASSIUM CHLORIDE CRYS ER 20 MEQ PO TBCR
40.0000 meq | EXTENDED_RELEASE_TABLET | Freq: Two times a day (BID) | ORAL | Status: DC
  Administered 2023-01-07: 40 meq via ORAL
  Filled 2023-01-07 (×2): qty 2

## 2023-01-07 MED ORDER — SPIRONOLACTONE 25 MG PO TABS
25.0000 mg | ORAL_TABLET | Freq: Once | ORAL | Status: AC
  Administered 2023-01-07: 25 mg via ORAL
  Filled 2023-01-07: qty 1

## 2023-01-07 MED ORDER — MAGNESIUM SULFATE 2 GM/50ML IV SOLN
2.0000 g | Freq: Once | INTRAVENOUS | Status: AC
  Administered 2023-01-07: 2 g via INTRAVENOUS
  Filled 2023-01-07: qty 50

## 2023-01-07 MED ORDER — CARVEDILOL 6.25 MG PO TABS
6.2500 mg | ORAL_TABLET | Freq: Two times a day (BID) | ORAL | Status: DC
  Administered 2023-01-07 – 2023-01-10 (×6): 6.25 mg via ORAL
  Filled 2023-01-07 (×6): qty 1

## 2023-01-07 NOTE — Progress Notes (Signed)
Mobility Specialist - Progress Note   01/07/23 1207  Mobility  Activity Ambulated with assistance in hallway  Level of Assistance Contact guard assist, steadying assist  Assistive Device Front wheel walker  Distance Ambulated (ft) 80 ft  Activity Response Tolerated well  Mobility Referral Yes  $Mobility charge 1 Mobility   Pt was received in chair and agreeable to mobility. No complaints throughout session. Pt was returned to chair with all needs met and chair alarm on.  Alda Lea  Mobility Specialist Please contact via Special educational needs teacher or Rehab office at [phone]

## 2023-01-07 NOTE — Plan of Care (Signed)

## 2023-01-07 NOTE — Progress Notes (Signed)
PROGRESS NOTE                                                                                                                                                                                                             Patient Demographics:    Brett Vasquez, is a 77 y.o. male, DOB - Apr 06, 1946, ZOX:[id number]  Outpatient Primary MD for the patient is Patient, No Pcp Per    LOS - 3  Admit date - 01/04/2023    Chief Complaint  Patient presents with   Shortness of Breath       Brief Narrative (HPI from H&P)   77 year old with history of COPD, active smoker, hypertension hyperlipidemia presented to the ER with many days of shortness of breath.  In the ER he was diagnosed with acute hypoxic respiratory failure due to combination of pneumonia and COPD exacerbation and admitted to the hospital.   Subjective:   Patient in bed, appears comfortable, denies any headache, no fever, no chest pain or pressure, some improvement in shortness of breath , no abdominal pain. No new focal weakness.   Assessment  & Plan :    Acute on chronic hypoxic respiratory failure caused by strep pneumonia community-acquired pneumonia and COPD exacerbation in a patient with underlying COPD and still smoking. He initially required BiPAP, currently on IV steroids, empiric antibiotics along with diuretics, currently off of BiPAP and on 4 L nasal cannula oxygen, overall tenuous strictly counseled to quit alcohol.  Encouraged to sit in chair use I-S and flutter valve for pulmonary toiletry and daytime.  Advance activity titrate down oxygen and monitor cultures.  So far strep pneumo urinary antigen is positive.  Was threatening to leave AMA but counseled to stay.   Ongoing smoking.  Counseled to quit.  Acute on chronic diastolic CHF.  Diuresed with IV Lasix, has edema, add TED stockings, repeat echo ordered last echo is from 2019.  Hypertension.  Currently on  Norvasc, add low-dose Coreg and monitor.  Microcytic anemia.  Present on admission, placed on PPI, anemia panel, placed on oral ferrous sulfate and folic acid, age-appropriate outpatient workup to be done by PCP postdischarge.  Type screen done monitor.  AKI.  Resolved after hydration.  Hypokalemia.  Replaced.      Condition - Extremely Guarded  Family Communication  :  None present  Code Status :  DNR  Consults  :  None  PUD Prophylaxis :     Procedures  :     TTE -  CT - 1. Mild right-greater-than-left basilar ground-glass opacities with associated cystic change and small scattered ground-glass nodules. Findings are likely due to infection with associated postinfectious pneumatoceles. Primary lung adenocarcinoma could also have this appearance. Follow-up chest CT is recommended in 3 months to ensure resolution. 2. Mildly enlarged ascending thoracic aorta, measuring up to 4.0 cm. Recommend annual imaging followup by CTA or MRA. This recommendation follows 2010 ACCF/AHA/AATS/ACR/ASA/SCA/SCAI/SIR/STS/SVM Guidelines for the Diagnosis and Management of Patients with Thoracic Aortic Disease.       Disposition Plan  :    Status is: Inpatient   DVT Prophylaxis  :    Place TED hose Start: 01/06/23 1014 enoxaparin (LOVENOX) injection 40 mg Start: 01/04/23 2200     Lab Results  Component Value Date   PLT 333 01/07/2023    Diet :  Diet Order             Diet regular Room service appropriate? Yes; Fluid consistency: Thin  Diet effective now                    Inpatient Medications  Scheduled Meds:  amLODipine  5 mg Oral Daily   carvedilol  3.125 mg Oral BID WC   docusate sodium  100 mg Oral BID   enoxaparin (LOVENOX) injection  40 mg Subcutaneous Q24H   ferrous sulfate  325 mg Oral BID WC   folic acid  1 mg Oral Daily   furosemide  40 mg Intravenous Once   ipratropium-albuterol  3 mL Nebulization Q6H   levofloxacin  750 mg Oral Daily   mometasone-formoterol   2 puff Inhalation BID   multivitamin with minerals  1 tablet Oral Daily   nicotine  14 mg Transdermal Daily   pantoprazole  40 mg Oral Daily   potassium chloride  40 mEq Oral BID   predniSONE  50 mg Oral Q breakfast   sodium chloride flush  3 mL Intravenous Q12H   spironolactone  25 mg Oral Once   Continuous Infusions:  cefTRIAXone (ROCEPHIN)  IV 2 g (01/07/23 0348)   potassium chloride 10 mEq (01/07/23 0926)   PRN Meds:.acetaminophen **OR** acetaminophen, albuterol, bisacodyl, haloperidol lactate, hydrALAZINE, ondansetron **OR** ondansetron (ZOFRAN) IV, oxyCODONE, polyethylene glycol    Objective:   Vitals:   01/07/23 0115 01/07/23 0318 01/07/23 0806 01/07/23 0842  BP:  (!) 143/91 (!) 166/106   Pulse:  85    Resp:   (!) 95   Temp:  98.4 F (36.9 C) (!) 97.4 F (36.3 C)   TempSrc:  Oral Oral   SpO2: 99%  95% 96%  Weight:      Height:        Wt Readings from Last 3 Encounters:  01/04/23 66.5 kg  06/01/22 66.5 kg  04/04/22 65.8 kg     Intake/Output Summary (Last 24 hours) at 01/07/2023 0950 Last data filed at 01/07/2023 0916 Gross per 24 hour  Intake 120 ml  Output 1600 ml  Net -1480 ml     Physical Exam  Awake Alert, No new F.N deficits, Normal affect [place].AT,PERRAL Supple Neck, No JVD,   Symmetrical Chest wall movement, minimal air movement bilaterally, +ve rales, 1 + leg edema RRR,No Gallops,Rubs or new Murmurs,  +ve B.Sounds, Abd Soft, No tenderness,   No Cyanosis, Clubbing  Data Review:    Recent Labs  Lab 01/03/23 1125 01/04/23 1400 01/04/23 1412 01/05/23 0352 01/06/23 0718 01/07/23 0303  WBC 7.9 18.0*  --  17.0* 15.0* 12.6*  HGB 7.6* 7.4* 9.9* 7.2* 7.7* 7.2*  HCT 27.2* 28.2* 29.0* 26.8* 27.1* 26.7*  PLT 342 389  --  306 357 333  MCV 64.3* 66.5*  --  65.4* 63.9* 65.3*  MCH 18.0* 17.5*  --  17.6* 18.2* 17.6*  MCHC 27.9* 26.2*  --  26.9* 28.4* 27.0*  RDW 20.0* 20.1*  --  19.9* 20.3* 19.9*  LYMPHSABS  --  0.5*  --   --  0.8 1.5   MONOABS  --  0.4  --   --  0.5 0.5  EOSABS  --  0.0  --   --  0.0 0.0  BASOSABS  --  0.0  --   --  0.0 0.0    Recent Labs  Lab 01/03/23 1125 01/04/23 1400 01/04/23 1412 01/05/23 0352 01/06/23 0718 01/07/23 0303  NA 139 140 140 139 140 141  K 2.2* 2.6* 2.5* 2.8* 3.2* 2.8*  CL 101 103  --  100 103 99  CO2 24 22  --  ANIONGAP 14 15  --  GLUCOSE 119* 175*  --  130* 100* 88  BUN 11 22  --  19 23 26*  CREATININE 0.99 1.32*  --  0.96 0.81 0.86  AST  --   --   --   --  53*  --   ALT  --   --   --   --  31  --   ALKPHOS  --   --   --   --  74  --   BILITOT  --   --   --   --  0.7  --   ALBUMIN  --   --   --   --  3.4*  --   CRP  --   --   --   --  6.7* 3.7*  PROCALCITON  --   --   --   --  3.15 2.23  BNP  --  380.2*  --   --  414.1* 236.7*  MG  --   --   --   --  2.1 1.7  CALCIUM 8.8* 8.4*  --  8.7* 9.0 9.0      Recent Labs  Lab 01/03/23 1125 01/04/23 1400 01/05/23 0352 01/06/23 0718 01/07/23 0303  CRP  --   --   --  6.7* 3.7*  PROCALCITON  --   --   --  3.15 2.23  BNP  --  380.2*  --  414.1* 236.7*  MG  --   --   --  2.1 1.7  CALCIUM 8.8* 8.4* 8.7* 9.0 9.0     Radiology Reports CT CHEST WO CONTRAST  Result Date: 01/06/2023 CLINICAL DATA:  Persistent cough EXAM: CT CHEST WITHOUT CONTRAST TECHNIQUE: Multidetector CT imaging of the chest was performed following the standard protocol without IV contrast. RADIATION DOSE REDUCTION: This exam was performed according to the departmental dose-optimization program which includes automated exposure control, adjustment of the mA and/or kV according to patient size and/or use of iterative reconstruction technique. COMPARISON:  Chest CT dated December 06, 2010 FINDINGS: Cardiovascular: Normal heart size. No pericardial effusion. Mild dilation of the ascending thoracic aorta, measuring up to 4.0 cm. Mild calcified plaque of the thoracic aorta. Moderate coronary artery calcifications.  Mediastinum/Nodes: Esophagus and  thyroid are unremarkable. No enlarged lymph nodes seen in the chest. Lungs/Pleura: Central airways are patent. Severe centrilobular emphysema. Mild right-greater-than-left ground-glass opacities with associated mild cystic change and scattered small ground-glass nodules. Reference thin-walled cyst of the right lower lobe measuring 10 x 9 mm on series 4, image 144. No pleural effusion or pneumothorax. Upper Abdomen: No acute abnormality. Musculoskeletal: Degenerative changes of the cervical spine and sternomanubrial joint, unchanged when compared with the prior exam. No aggressive appearing osseous lesions. IMPRESSION: 1. Mild right-greater-than-left basilar ground-glass opacities with associated cystic change and small scattered ground-glass nodules. Findings are likely due to infection with associated postinfectious pneumatoceles. Primary lung adenocarcinoma could also have this appearance. Follow-up chest CT is recommended in 3 months to ensure resolution. 2. Mildly enlarged ascending thoracic aorta, measuring up to 4.0 cm. Recommend annual imaging followup by CTA or MRA. This recommendation follows 2010 ACCF/AHA/AATS/ACR/ASA/SCA/SCAI/SIR/STS/SVM Guidelines for the Diagnosis and Management of Patients with Thoracic Aortic Disease. Circulation. 2010; 121: Z610-R604. Aortic aneurysm NOS (ICD10-I71.9) 3. Aortic Atherosclerosis (ICD10-I70.0) and Emphysema (ICD10-J43.9). Electronically Signed   By: Allegra Lai M.D.   On: 01/06/2023 16:17   DG Chest Port 1 View  Result Date: 01/06/2023 CLINICAL DATA:  Shortness of breath.  COPD. EXAM: PORTABLE CHEST 1 VIEW COMPARISON:  01/04/2023 FINDINGS: Heart size and mediastinal contours are stable. Lungs are hyperinflated. Advanced changes of emphysema. Similar appearance of left basilar scarring with surrounding architectural distortion and slight asymmetric elevation of the left hemidiaphragm. No superimposed pleural effusion or interstitial edema. Subtle opacity scratch  set superimposed opacity within the left base may represent atelectasis or pneumonia. Visualized osseous structures are unremarkable. IMPRESSION: 1. Subtle opacity within the left base may represent atelectasis or pneumonia. 2. Advanced changes of emphysema. 3. Chronic left basilar scarring. Electronically Signed   By: Signa Kell M.D.   On: 01/06/2023 08:26   DG Chest Port 1 View  Result Date: 01/04/2023 CLINICAL DATA:  Dyspnea.  Shortness of breath EXAM: PORTABLE CHEST 1 VIEW COMPARISON:  X-ray 01/03/2023 and older FINDINGS: Hyperinflation. No pneumothorax or effusion. Chronic lung changes. There is some asymmetric increased density at the left lung base. A subtle acute process is possible recommend follow-up. Borderline cardiopericardial silhouette. Tortuous ectatic aorta. Overlapping cardiac leads. IMPRESSION: Subtle opacity left lung base. An acute process is possible. Recommend follow-up. Hyperinflation with underlying chronic lung disease Electronically Signed   By: Karen Kays M.D.   On: 01/04/2023 13:49   DG Chest 2 View  Result Date: 01/03/2023 CLINICAL DATA:  77 year old male with shortness of breath. EXAM: CHEST - 2 VIEW COMPARISON:  Chest radiographs 05/29/2022 and earlier. FINDINGS: Chronically tortuous aortic arch and emphysema demonstrated on prior CTA. Lung volumes and mediastinal contours are stable from last year. Visualized tracheal air column is within normal limits. No pneumothorax, pulmonary edema, pleural effusion or acute pulmonary opacity. No acute osseous abnormality identified. Negative visible bowel gas. IMPRESSION: 1. No acute cardiopulmonary abnormality. 2. Chronic Emphysema (ICD10-J43.9), tortuous aortic arch. Electronically Signed   By: Odessa Fleming M.D.   On: 01/03/2023 11:51      Signature  -   Susa Raring M.D on 01/07/2023 at 9:50 AM   -  To page go to [email or web address]

## 2023-01-07 NOTE — Care Management Important Message (Signed)
Important Message  Patient Details  Name: Brett Vasquez MRN: [id number] Date of Birth: Apr 11, 1946   Medicare Important Message Given:  Yes     Telisha Zawadzki Stefan Church 01/07/2023, 3:17 PM

## 2023-01-07 NOTE — TOC Initial Note (Signed)
Transition of Care [facility]) - Initial/Assessment Note    Patient Details  Name: Brett Vasquez MRN: [id number] Date of Birth: October 26, 1945  [facility]) CM/SW Contact:    Gordy Clement, RN Phone Number: 01/07/2023, 11:31 AM  Clinical Narrative:      CM met with Patient bedside to complete IA. Patient lives at home with his Brother.   He was not on O2 at baseline but is requiring 3-4L here.  CM is following for O2 needs at DC.   Patient owns a cane and a shower bench is being recommended. Patient is agreeable and choice is for [employer].  Patient does not have a PCP and is agreeable to establishing one. Request made for new PCP.   Patient denies concerns for medication affordability.                Brother will transport home   Expected Discharge Plan: Home/Self Care Barriers to Discharge: Continued Medical Work up   Patient Goals and CMS Choice Patient states their goals for this hospitalization and ongoing recovery are:: Go home CMS Medicare.gov Compare Post Acute Care list provided to:: Patient Choice offered to / list presented to : Patient      Expected Discharge Plan and Services In-house Referral: PCP / [facility]   Post Acute Care Choice: Durable Medical Equipment                   DME Arranged: Shower stool, Oxygen (O2 needs TBD) DME Agency: [employer] Date DME Agency Contacted: 01/07/23 Time DME Agency Contacted: [id number] Representative spoke with at DME Agency: Vaughan Basta HH Arranged: NA HH Agency: NA        Prior Living Arrangements/Services   Lives with:: Siblings (Brother) Patient language and need for interpreter reviewed:: Yes Do you feel safe going back to the place where you live?: Yes      Need for Family Participation in Patient Care: No (Comment)   Current home services:  (NONE) Criminal Activity/Legal Involvement Pertinent to Current Situation/Hospitalization: No - Comment as needed  Activities of Daily Living Home  Assistive Devices/Equipment: None ADL Screening (condition at time of admission) Patient's cognitive ability adequate to safely complete daily activities?: Yes Is the patient deaf or have difficulty hearing?: No Does the patient have difficulty seeing, even when wearing glasses/contacts?: Yes Does the patient have difficulty concentrating, remembering, or making decisions?: No Patient able to express need for assistance with ADLs?: Yes Does the patient have difficulty dressing or bathing?: No Independently performs ADLs?: Yes (appropriate for developmental age) Does the patient have difficulty walking or climbing stairs?: No Weakness of Legs: Both Weakness of Arms/Hands: None  Permission Sought/Granted Permission sought to share information with : Other (comment) (DME Agency) Permission granted to share information with : Yes, Verbal Permission Granted     Permission granted to share info w AGENCY: [employer]        Emotional Assessment Appearance:: Appears stated age Attitude/Demeanor/Rapport: Gracious, Engaged Affect (typically observed): Pleasant Orientation: : Oriented to Self, Oriented to Place, Oriented to  Time, Oriented to Situation Alcohol / Substance Use: Not Applicable Psych Involvement: No (comment)  Admission diagnosis:  Hypokalemia [E87.6] COPD exacerbation [J44.1] COPD with acute exacerbation [J44.1] Acute on chronic respiratory failure with hypoxia and hypercapnia [J96.21, J96.22] Patient Active Problem List   Diagnosis Date Noted   COPD with acute exacerbation 01/04/2023   Left lower lobe pneumonia 01/04/2023   AKI (acute kidney injury) 01/04/2023   DNR (do  not resuscitate) 01/04/2023   Delirium 05/30/2022   Fatigue 05/30/2022   Shortness of breath 05/30/2022   GI bleed 05/29/2022   Hypokalemia    Symptomatic anemia 04/04/2022   Iron deficiency anemia 04/04/2022   Osteoarthritis 04/03/2022   Chronic obstructive lung disease 04/06/2021   Essential  hypertension 11/10/2017   Metabolic acidosis 11/10/2017   Aphasia 11/10/2017   Fall at home, initial encounter 11/10/2017   suspected Seizure (HCC) 11/10/2017   TIA (transient ischemic attack) 11/09/2017   Tobacco use disorder    PCP:  Patient, No Pcp Per Pharmacy:   [id number] [place], [place] - [address] [place] [address] Phone: [phone] Fax: [phone]     Social Determinants of Health (SDOH) Social History: SDOH Screenings   Food Insecurity: No Food Insecurity (01/04/2023)  Housing: Low Risk  (01/04/2023)  Transportation Needs: No Transportation Needs (01/04/2023)  Utilities: Not At Risk (01/04/2023)  Depression (PHQ2-9): Low Risk  (10/09/2020)  Tobacco Use: High Risk (01/04/2023)   SDOH Interventions:     Readmission Risk Interventions     No data to display

## 2023-01-07 NOTE — Progress Notes (Signed)
Physical Therapy Treatment Patient Details Name: Brett Vasquez MRN: [id number] DOB: 04-01-1946 Today's Date: 01/07/2023   History of Present Illness 77 y.o. male presents to [facility] hospital on 01/04/2023 with SOB. Pt admitted for chronic respiratory failure associate with COPD exacerbation and LLL PNA. PMH: chronic iron deficiency anemia secondary to GI bleed, COPD, HTN, HLD, coronary hypokalemia, cigarette smoke    PT Comments    Pt tolerated today's session well but continues to be limited by fatigue.  Pt able to ambulate in the hallway ~75 feet with one standing rest break, desatting briefly to 88% but quickly recovering on 2L. Pt reports mild to moderate fatigue with mobility but agreeable to end session sitting in chair. With ambulation, path deviation noted with lateral sway, close guard provided with pt utilizing wall x1 to self-correct balance with UE support. Acute PT will continue to follow to progress balance and activity tolerance with mobility.    Recommendations for follow up therapy are one component of a multi-disciplinary discharge planning process, led by the attending physician.  Recommendations may be updated based on patient status, additional functional criteria and insurance authorization.  Follow Up Recommendations       Assistance Recommended at Discharge PRN  Patient can return home with the following A little help with bathing/dressing/bathroom;Assistance with cooking/housework;Help with stairs or ramp for entrance;A little help with walking and/or transfers   Equipment Recommendations  None recommended by PT    Recommendations for Other Services       Precautions / Restrictions Precautions Precautions: Fall;Other (comment) Precaution Comments: watch O2 and HR Restrictions Weight Bearing Restrictions: No     Mobility  Bed Mobility Overal bed mobility: Modified Independent             General bed mobility comments: HOB elevated but performing without  difficulty    Transfers Overall transfer level: Needs assistance Equipment used: None Transfers: Sit to/from Stand Sit to Stand: Supervision           General transfer comment: increased time to complete and requiring 2 attempts but standing from low bed, no physical assist, no AD    Ambulation/Gait Ambulation/Gait assistance: Min guard Gait Distance (Feet): 75 Feet (1 standing rest break) Assistive device: None Gait Pattern/deviations: Step-through pattern, Drifts right/left Gait velocity: decreased     General Gait Details: increased lateral sway, utilizing wall support to correct balance x1 today, brief standing rest break halfway through due to fatigue, minG for balance and safety   Stairs             Wheelchair Mobility    Modified Rankin (Stroke Patients Only)       Balance Overall balance assessment: Needs assistance Sitting-balance support: No upper extremity supported, Feet supported Sitting balance-Leahy Scale: Good     Standing balance support: No upper extremity supported, During functional activity Standing balance-Leahy Scale: Fair                              Cognition Arousal/Alertness: Awake/alert Behavior During Therapy: WFL for tasks assessed/performed Overall Cognitive Status: Within Functional Limits for tasks assessed                                          Exercises      General Comments General comments (skin integrity, edema, etc.): Desat to 88% on 2L  O2 [place] with mobility but quickly recovering with standing rest break. Upon arrival, pt bleeding from one IV site, RN notified and in room to check      Pertinent Vitals/Pain Pain Assessment Pain Assessment: No/denies pain    Home Living                          Prior Function            PT Goals (current goals can now be found in the care plan section) Acute Rehab PT Goals Patient Stated Goal: to improve activity tolerance PT  Goal Formulation: With patient Time For Goal Achievement: 01/19/23 Potential to Achieve Goals: Good Progress towards PT goals: Progressing toward goals    Frequency    Min 3X/week      PT Plan Current plan remains appropriate    Co-evaluation              AM-PAC PT "6 Clicks" Mobility   Outcome Measure  Help needed turning from your back to your side while in a flat bed without using bedrails?: None Help needed moving from lying on your back to sitting on the side of a flat bed without using bedrails?: None Help needed moving to and from a bed to a chair (including a wheelchair)?: A Little Help needed standing up from a chair using your arms (e.g., wheelchair or bedside chair)?: A Little Help needed to walk in hospital room?: A Little Help needed climbing 3-5 steps with a railing? : A Little 6 Click Score: 20    End of Session Equipment Utilized During Treatment: Oxygen Activity Tolerance: Patient limited by fatigue Patient left: in chair;with call bell/phone within reach;with chair alarm set Nurse Communication: Mobility status PT Visit Diagnosis: Other abnormalities of gait and mobility (R26.89)     Time: 4098-1191 PT Time Calculation (min) (ACUTE ONLY): 21 min  Charges:  $Gait Training: 8-22 mins                     Brett Vasquez, PT DPT Acute Rehabilitation Services Office [phone]    Brett Vasquez 01/07/2023, 9:26 AM

## 2023-01-08 ENCOUNTER — Encounter (HOSPITAL_COMMUNITY): Payer: Self-pay | Admitting: Internal Medicine

## 2023-01-08 DIAGNOSIS — J441 Chronic obstructive pulmonary disease with (acute) exacerbation: Secondary | ICD-10-CM | POA: Diagnosis not present

## 2023-01-08 LAB — CBC WITH DIFFERENTIAL/PLATELET
Abs Immature Granulocytes: 0.06 10*3/uL (ref 0.00–0.07)
Basophils Absolute: 0 10*3/uL (ref 0.0–0.1)
Basophils Relative: 0 %
Eosinophils Absolute: 0 10*3/uL (ref 0.0–0.5)
Eosinophils Relative: 0 %
HCT: 24.6 % — ABNORMAL LOW (ref 39.0–52.0)
Hemoglobin: 6.9 g/dL — CL (ref 13.0–17.0)
Immature Granulocytes: 1 %
Lymphocytes Relative: 14 %
Lymphs Abs: 1.2 10*3/uL (ref 0.7–4.0)
MCH: 17.9 pg — ABNORMAL LOW (ref 26.0–34.0)
MCHC: 28 g/dL — ABNORMAL LOW (ref 30.0–36.0)
MCV: 63.7 fL — ABNORMAL LOW (ref 80.0–100.0)
Monocytes Absolute: 0.4 10*3/uL (ref 0.1–1.0)
Monocytes Relative: 5 %
Neutro Abs: 6.9 10*3/uL (ref 1.7–7.7)
Neutrophils Relative %: 80 %
Platelets: 329 10*3/uL (ref 150–400)
RBC: 3.86 MIL/uL — ABNORMAL LOW (ref 4.22–5.81)
RDW: 19.8 % — ABNORMAL HIGH (ref 11.5–15.5)
WBC: 8.6 10*3/uL (ref 4.0–10.5)
nRBC: 0 % (ref 0.0–0.2)

## 2023-01-08 LAB — BASIC METABOLIC PANEL
Anion gap: 14 (ref 5–15)
BUN: 21 mg/dL (ref 8–23)
CO2: 24 mmol/L (ref 22–32)
Calcium: 8.2 mg/dL — ABNORMAL LOW (ref 8.9–10.3)
Chloride: 104 mmol/L (ref 98–111)
Creatinine, Ser: 0.77 mg/dL (ref 0.61–1.24)
GFR, Estimated: >60 mL/min (ref >60)
Glucose, Bld: 104 mg/dL — ABNORMAL HIGH (ref 70–99)
Potassium: 4.1 mmol/L (ref 3.5–5.1)
Sodium: 142 mmol/L (ref 135–145)

## 2023-01-08 LAB — BPAM RBC
Blood Product Expiration Date: 202405272359
ISSUE DATE / TIME: 202404250606
Unit Type and Rh: 5100
Unit Type and Rh: 5100

## 2023-01-08 LAB — CULTURE, BLOOD (ROUTINE X 2): Special Requests: ADEQUATE

## 2023-01-08 LAB — TYPE AND SCREEN: Antibody Screen: NEGATIVE

## 2023-01-08 LAB — PREPARE RBC (CROSSMATCH)

## 2023-01-08 LAB — PROCALCITONIN: Procalcitonin: 0.57 ng/mL

## 2023-01-08 LAB — C-REACTIVE PROTEIN: CRP: 2 mg/dL — ABNORMAL HIGH (ref <1.0)

## 2023-01-08 LAB — MAGNESIUM: Magnesium: 1.6 mg/dL — ABNORMAL LOW (ref 1.7–2.4)

## 2023-01-08 LAB — BRAIN NATRIURETIC PEPTIDE: B Natriuretic Peptide: 392 pg/mL — ABNORMAL HIGH (ref 0.0–100.0)

## 2023-01-08 MED ORDER — MAGNESIUM SULFATE 4 GM/100ML IV SOLN
4.0000 g | Freq: Once | INTRAVENOUS | Status: AC
  Administered 2023-01-08: 4 g via INTRAVENOUS
  Filled 2023-01-08: qty 100

## 2023-01-08 MED ORDER — SODIUM CHLORIDE 0.9 % IV SOLN
510.0000 mg | Freq: Once | INTRAVENOUS | Status: AC
  Administered 2023-01-08: 510 mg via INTRAVENOUS
  Filled 2023-01-08: qty 17

## 2023-01-08 MED ORDER — ISOSORBIDE MONONITRATE ER 30 MG PO TB24
30.0000 mg | ORAL_TABLET | Freq: Every day | ORAL | Status: DC
  Administered 2023-01-08 – 2023-01-09 (×2): 30 mg via ORAL
  Filled 2023-01-08 (×2): qty 1

## 2023-01-08 MED ORDER — HALOPERIDOL LACTATE 5 MG/ML IJ SOLN
2.0000 mg | Freq: Four times a day (QID) | INTRAMUSCULAR | Status: DC | PRN
  Administered 2023-01-08 – 2023-01-09 (×3): 2 mg via INTRAVENOUS
  Filled 2023-01-08: qty 1

## 2023-01-08 MED ORDER — SODIUM CHLORIDE 0.9% IV SOLUTION: Freq: Once | INTRAVENOUS | Status: AC

## 2023-01-08 MED ORDER — HALOPERIDOL LACTATE 5 MG/ML IJ SOLN
1.0000 mg | Freq: Four times a day (QID) | INTRAMUSCULAR | Status: DC | PRN
  Administered 2023-01-08: 1 mg via INTRAVENOUS
  Filled 2023-01-08 (×2): qty 1

## 2023-01-08 MED ORDER — FUROSEMIDE 10 MG/ML IJ SOLN
60.0000 mg | Freq: Once | INTRAMUSCULAR | Status: AC
  Administered 2023-01-08: 60 mg via INTRAVENOUS
  Filled 2023-01-08: qty 6

## 2023-01-08 NOTE — Progress Notes (Signed)
PROGRESS NOTE                                                                                                                                                                                                             Patient Demographics:    Brett Vasquez, is a 77 y.o. male, DOB - 1946/02/09, NWG:[id number]  Outpatient Primary MD for the patient is Patient, No Pcp Per    LOS - 4  Admit date - 01/04/2023    Chief Complaint  Patient presents with   Shortness of Breath       Brief Narrative (HPI from H&P)   77 year old with history of COPD, active smoker, hypertension hyperlipidemia presented to the ER with many days of shortness of breath.  In the ER he was diagnosed with acute hypoxic respiratory failure due to combination of pneumonia and COPD exacerbation and admitted to the hospital.   Subjective:   Patient in bed denies any headache chest or abdominal pain, denies any blood in stool or urine, no denies any black stool, minimally confused but at times says he wants to leave and go home.   Assessment  & Plan :    Acute on chronic hypoxic respiratory failure caused by strep pneumonia community-acquired pneumonia and COPD exacerbation in a patient with underlying COPD and still smoking. He initially required BiPAP, currently on IV steroids, empiric antibiotics along with diuretics, currently off of BiPAP and on 4 L nasal cannula oxygen, overall tenuous strictly counseled to quit alcohol.  Encouraged to sit in chair use I-S and flutter valve for pulmonary toiletry and daytime.  Advance activity titrate down oxygen and monitor cultures.  So far strep pneumo urinary antigen is positive.  Was threatening to leave AMA but counseled to stay.  Discussed with [name] on 01/08/2023, continue to monitor   Ongoing smoking.  Counseled to quit.  Acute on chronic diastolic CHF EF 60%.  Diuresed with IV Lasix, has edema, added TED  stockings, much improved continue intermittent diuresis.  Hypertension.  Currently on Norvasc, add low-dose Coreg and monitor.  Microcytic anemia.  Present on admission, placed on PPI, anemia panel, placed on oral ferrous sulfate and folic acid, Feraheme, 2 units of packed RBC on 01/08/2023.  Patient strictly refuses EGD or colonoscopy says he will follow it up with his physician if needed.  AKI.  Resolved after hydration.  Hypokalemia.  Replaced.  Acute metabolic encephalopathy.  As needed Haldol and monitor.  Avoid benzodiazepines and narcotics.  Restraints if needed.  [name] updated.      Condition - Extremely Guarded  Family Communication  :  [name] [phone] in detail on 01/08/23  Code Status :  DNR  Consults  :  None  PUD Prophylaxis :     Procedures  :     TTE - EF of 60% LVH with moderate AS  CT - 1. Mild right-greater-than-left basilar ground-glass opacities with associated cystic change and small scattered ground-glass nodules. Findings are likely due to infection with associated postinfectious pneumatoceles. Primary lung adenocarcinoma could also have this appearance. Follow-up chest CT is recommended in 3 months to ensure resolution. 2. Mildly enlarged ascending thoracic aorta, measuring up to 4.0 cm. Recommend annual imaging followup by CTA or MRA. This recommendation follows 2010 ACCF/AHA/AATS/ACR/ASA/SCA/SCAI/SIR/STS/SVM Guidelines for the Diagnosis and Management of Patients with Thoracic Aortic Disease.       Disposition Plan  :    Status is: Inpatient   DVT Prophylaxis  :    Place TED hose Start: 01/06/23 1014 enoxaparin (LOVENOX) injection 40 mg Start: 01/04/23 2200     Lab Results  Component Value Date   PLT 329 01/08/2023    Diet :  Diet Order             Diet regular Room service appropriate? Yes; Fluid consistency: Thin  Diet effective now                    Inpatient Medications  Scheduled Meds:  amLODipine  5 mg Oral  Daily   carvedilol  6.25 mg Oral BID WC   docusate sodium  100 mg Oral BID   enoxaparin (LOVENOX) injection  40 mg Subcutaneous Q24H   ferrous sulfate  325 mg Oral BID WC   folic acid  1 mg Oral Daily   ipratropium-albuterol  3 mL Nebulization Q6H   isosorbide mononitrate  30 mg Oral Daily   levofloxacin  750 mg Oral Daily   mometasone-formoterol  2 puff Inhalation BID   multivitamin with minerals  1 tablet Oral Daily   nicotine  14 mg Transdermal Daily   pantoprazole  40 mg Oral Daily   predniSONE  30 mg Oral Q breakfast   sodium chloride flush  3 mL Intravenous Q12H   Continuous Infusions:  magnesium sulfate bolus IVPB     PRN Meds:.acetaminophen **OR** acetaminophen, albuterol, bisacodyl, haloperidol lactate, hydrALAZINE, ondansetron **OR** ondansetron (ZOFRAN) IV, oxyCODONE, polyethylene glycol    Objective:   Vitals:   01/08/23 0620 01/08/23 0635 01/08/23 0800 01/08/23 0943  BP: (!) 195/103 (!) 181/99 (!) 174/84   Pulse: 93 97 (!) 103   Resp: Temp: 98.1 F (36.7 C) 98.2 F (36.8 C) 98.3 F (36.8 C)   TempSrc: Oral Oral Oral   SpO2: 99% 100% 98% 100%  Weight:      Height:        Wt Readings from Last 3 Encounters:  01/04/23 66.5 kg  06/01/22 66.5 kg  04/04/22 65.8 kg     Intake/Output Summary (Last 24 hours) at 01/08/2023 1009 Last data filed at 01/08/2023 0845 Gross per 24 hour  Intake 972.5 ml  Output 2300 ml  Net -1327.5 ml     Physical Exam  Awake Alert, No new F.N deficits, Normal affect [place].AT,PERRAL Supple Neck, No JVD,  Symmetrical Chest wall movement, minimal air movement bilaterally, +ve rales, 1 + leg edema RRR,No Gallops,Rubs or new Murmurs,  +ve B.Sounds, Abd Soft, No tenderness,   No Cyanosis, Clubbing        Data Review:    Recent Labs  Lab 01/04/23 1400 01/04/23 1412 01/05/23 0352 01/06/23 0718 01/07/23 0303 01/08/23 0436  WBC 18.0*  --  17.0* 15.0* 12.6* 8.6  HGB 7.4* 9.9* 7.2* 7.7* 7.2* 6.9*  HCT 28.2* 29.0*  26.8* 27.1* 26.7* 24.6*  PLT 389  --  306 357 333 329  MCV 66.5*  --  65.4* 63.9* 65.3* 63.7*  MCH 17.5*  --  17.6* 18.2* 17.6* 17.9*  MCHC 26.2*  --  26.9* 28.4* 27.0* 28.0*  RDW 20.1*  --  19.9* 20.3* 19.9* 19.8*  LYMPHSABS 0.5*  --   --  0.8 1.5 1.2  MONOABS 0.4  --   --  0.5 0.5 0.4  EOSABS 0.0  --   --  0.0 0.0 0.0  BASOSABS 0.0  --   --  0.0 0.0 0.0    Recent Labs  Lab 01/04/23 1400 01/04/23 1412 01/05/23 0352 01/06/23 0718 01/07/23 0303 01/08/23 0436  NA 140 140 139 140 141 142  K 2.6* 2.5* 2.8* 3.2* 2.8* 4.1  CL 103  --  100 103 99 104  CO2 22  --  25 26 29 24   ANIONGAP 15  --  14 11 13 14   GLUCOSE 175*  --  130* 100* 88 104*  BUN 22  --  19 23 26* 21  CREATININE 1.32*  --  0.96 0.81 0.86 0.77  AST  --   --   --  53*  --   --   ALT  --   --   --  31  --   --   ALKPHOS  --   --   --  74  --   --   BILITOT  --   --   --  0.7  --   --   ALBUMIN  --   --   --  3.4*  --   --   CRP  --   --   --  6.7* 3.7* 2.0*  PROCALCITON  --   --   --  3.15 2.23 0.57  BNP 380.2*  --   --  414.1* 236.7* 392.0*  MG  --   --   --  2.1 1.7 1.6*  CALCIUM 8.4*  --  8.7* 9.0 9.0 8.2*      Recent Labs  Lab 01/04/23 1400 01/05/23 0352 01/06/23 0718 01/07/23 0303 01/08/23 0436  CRP  --   --  6.7* 3.7* 2.0*  PROCALCITON  --   --  3.15 2.23 0.57  BNP 380.2*  --  414.1* 236.7* 392.0*  MG  --   --  2.1 1.7 1.6*  CALCIUM 8.4* 8.7* 9.0 9.0 8.2*     Radiology Reports ECHOCARDIOGRAM COMPLETE  Result Date: 01/07/2023    ECHOCARDIOGRAM REPORT   Patient Name:   BOBAK OGUINN Date of Exam: 01/07/2023 Medical Rec #:  [id number]      Height:       66.0 in Accession #:    [id number]     Weight:       146.6 lb Date of Birth:  01-17-1946      BSA:          1.753 m Patient Age:    69 years  BP:           115/81 mmHg Patient Gender: M              HR:           90 bpm. Exam Location:  Inpatient Procedure: 2D Echo, Cardiac Doppler and Color Doppler Indications:    Chest Pain  History:         Patient has prior history of Echocardiogram examinations, most                 recent 11/11/2021. COPD and TIA; Risk Factors:Hypertension.  Sonographer:    Wallie Char Referring Phys: [facility]  Sonographer Comments: Technically difficult study due to poor echo windows. IMPRESSIONS  1. Left ventricular ejection fraction, by estimation, is 60 to 65%. The left ventricle has normal function. The left ventricle has no regional wall motion abnormalities. There is mild concentric left ventricular hypertrophy. Left ventricular diastolic function could not be evaluated.  2. Right ventricular systolic function is normal. The right ventricular size is normal.  3. The mitral valve is normal in structure. No evidence of mitral valve regurgitation. No evidence of mitral stenosis.  4. The aortic valve is tricuspid. Aortic valve regurgitation is moderate. Aortic valve sclerosis/calcification is present, without any evidence of aortic stenosis. Aortic valve area, by VTI measures 3.35 cm. Aortic valve mean gradient measures 3.0 mmHg. Aortic valve Vmax measures 1.21 m/s. FINDINGS  Left Ventricle: Left ventricular ejection fraction, by estimation, is 60 to 65%. The left ventricle has normal function. The left ventricle has no regional wall motion abnormalities. The left ventricular internal cavity size was normal in size. There is  mild concentric left ventricular hypertrophy. Left ventricular diastolic function could not be evaluated. Right Ventricle: The right ventricular size is normal. No increase in right ventricular wall thickness. Right ventricular systolic function is normal. Left Atrium: Left atrial size was not well visualized. Right Atrium: Right atrial size was normal in size. Pericardium: There is no evidence of pericardial effusion. Mitral Valve: The mitral valve is normal in structure. No evidence of mitral valve regurgitation. No evidence of mitral valve stenosis. MV peak gradient, 2.7 mmHg. The mean mitral  valve gradient is 2.0 mmHg. Tricuspid Valve: The tricuspid valve is normal in structure. Tricuspid valve regurgitation is mild . No evidence of tricuspid stenosis. Aortic Valve: The aortic valve is tricuspid. Aortic valve regurgitation is moderate. Aortic valve sclerosis/calcification is present, without any evidence of aortic stenosis. Aortic valve mean gradient measures 3.0 mmHg. Aortic valve peak gradient measures 5.9 mmHg. Aortic valve area, by VTI measures 3.35 cm. Pulmonic Valve: The pulmonic valve was normal in structure. Pulmonic valve regurgitation is trivial. No evidence of pulmonic stenosis. Aorta: The aortic root is normal in size and structure. Venous: The inferior vena cava was not well visualized. IAS/Shunts: No atrial level shunt detected by color flow Doppler.  LEFT VENTRICLE PLAX 2D LVIDd:         4.60 cm LVIDs:         3.70 cm LV PW:         1.30 cm LV IVS:        1.20 cm LVOT diam:     2.20 cm LV SV:         79 LV SV Index:   45 LVOT Area:     3.80 cm  RIGHT VENTRICLE         IVC TAPSE (M-mode): 3.1 cm  IVC diam: 3.00 cm AORTIC  VALVE AV Area (Vmax):    3.93 cm AV Area (Vmean):   3.75 cm AV Area (VTI):     3.35 cm AV Vmax:           121.00 cm/s AV Vmean:          88.000 cm/s AV VTI:            0.235 m AV Peak Grad:      5.9 mmHg AV Mean Grad:      3.0 mmHg LVOT Vmax:         125.00 cm/s LVOT Vmean:        86.800 cm/s LVOT VTI:          0.207 m LVOT/AV VTI ratio: 0.88  AORTA Ao Root diam: 3.50 cm MITRAL VALVE               TRICUSPID VALVE MV Area (PHT): 4.89 cm    TR Peak grad:   48.2 mmHg MV Area VTI:   3.78 cm    TR Vmax:        347.00 cm/s MV Peak grad:  2.7 mmHg MV Mean grad:  2.0 mmHg    SHUNTS MV Vmax:       0.83 m/s    Systemic VTI:  0.21 m MV Vmean:      60.5 cm/s   Systemic Diam: 2.20 cm MV Decel Time: 155 msec MV E velocity: 80.50 cm/s MV A velocity: 88.90 cm/s MV E/A ratio:  0.91 Armanda Magic MD Electronically signed by Armanda Magic MD Signature Date/Time: 01/07/2023/3:32:45 PM     Final    CT CHEST WO CONTRAST  Result Date: 01/06/2023 CLINICAL DATA:  Persistent cough EXAM: CT CHEST WITHOUT CONTRAST TECHNIQUE: Multidetector CT imaging of the chest was performed following the standard protocol without IV contrast. RADIATION DOSE REDUCTION: This exam was performed according to the departmental dose-optimization program which includes automated exposure control, adjustment of the mA and/or kV according to patient size and/or use of iterative reconstruction technique. COMPARISON:  Chest CT dated December 06, 2010 FINDINGS: Cardiovascular: Normal heart size. No pericardial effusion. Mild dilation of the ascending thoracic aorta, measuring up to 4.0 cm. Mild calcified plaque of the thoracic aorta. Moderate coronary artery calcifications. Mediastinum/Nodes: Esophagus and thyroid are unremarkable. No enlarged lymph nodes seen in the chest. Lungs/Pleura: Central airways are patent. Severe centrilobular emphysema. Mild right-greater-than-left ground-glass opacities with associated mild cystic change and scattered small ground-glass nodules. Reference thin-walled cyst of the right lower lobe measuring 10 x 9 mm on series 4, image 144. No pleural effusion or pneumothorax. Upper Abdomen: No acute abnormality. Musculoskeletal: Degenerative changes of the cervical spine and sternomanubrial joint, unchanged when compared with the prior exam. No aggressive appearing osseous lesions. IMPRESSION: 1. Mild right-greater-than-left basilar ground-glass opacities with associated cystic change and small scattered ground-glass nodules. Findings are likely due to infection with associated postinfectious pneumatoceles. Primary lung adenocarcinoma could also have this appearance. Follow-up chest CT is recommended in 3 months to ensure resolution. 2. Mildly enlarged ascending thoracic aorta, measuring up to 4.0 cm. Recommend annual imaging followup by CTA or MRA. This recommendation follows 2010  ACCF/AHA/AATS/ACR/ASA/SCA/SCAI/SIR/STS/SVM Guidelines for the Diagnosis and Management of Patients with Thoracic Aortic Disease. Circulation. 2010; 121: W295-A213. Aortic aneurysm NOS (ICD10-I71.9) 3. Aortic Atherosclerosis (ICD10-I70.0) and Emphysema (ICD10-J43.9). Electronically Signed   By: Allegra Lai M.D.   On: 01/06/2023 16:17   DG Chest Port 1 View  Result Date: 01/06/2023 CLINICAL DATA:  Shortness of breath.  COPD. EXAM: PORTABLE CHEST 1 VIEW COMPARISON:  01/04/2023 FINDINGS: Heart size and mediastinal contours are stable. Lungs are hyperinflated. Advanced changes of emphysema. Similar appearance of left basilar scarring with surrounding architectural distortion and slight asymmetric elevation of the left hemidiaphragm. No superimposed pleural effusion or interstitial edema. Subtle opacity scratch set superimposed opacity within the left base may represent atelectasis or pneumonia. Visualized osseous structures are unremarkable. IMPRESSION: 1. Subtle opacity within the left base may represent atelectasis or pneumonia. 2. Advanced changes of emphysema. 3. Chronic left basilar scarring. Electronically Signed   By: Signa Kell M.D.   On: 01/06/2023 08:26   DG Chest Port 1 View  Result Date: 01/04/2023 CLINICAL DATA:  Dyspnea.  Shortness of breath EXAM: PORTABLE CHEST 1 VIEW COMPARISON:  X-ray 01/03/2023 and older FINDINGS: Hyperinflation. No pneumothorax or effusion. Chronic lung changes. There is some asymmetric increased density at the left lung base. A subtle acute process is possible recommend follow-up. Borderline cardiopericardial silhouette. Tortuous ectatic aorta. Overlapping cardiac leads. IMPRESSION: Subtle opacity left lung base. An acute process is possible. Recommend follow-up. Hyperinflation with underlying chronic lung disease Electronically Signed   By: Karen Kays M.D.   On: 01/04/2023 13:49      Signature  -   Susa Raring M.D on 01/08/2023 at 10:09 AM   -  To page go to  [email or web address]

## 2023-01-08 NOTE — Plan of Care (Signed)
  Problem: Education: Goal: Knowledge of disease or condition will improve Outcome: Progressing Goal: Knowledge of the prescribed therapeutic regimen will improve Outcome: Progressing Goal: Individualized Educational Video(s) Outcome: Progressing   Problem: Activity: Goal: Ability to tolerate increased activity will improve Outcome: Progressing Goal: Will verbalize the importance of balancing activity with adequate rest periods Outcome: Progressing   Problem: Respiratory: Goal: Ability to maintain a clear airway will improve Outcome: Progressing Goal: Levels of oxygenation will improve Outcome: Progressing Goal: Ability to maintain adequate ventilation will improve Outcome: Progressing   Problem: Activity: Goal: Ability to tolerate increased activity will improve Outcome: Progressing   Problem: Clinical Measurements: Goal: Ability to maintain a body temperature in the normal range will improve Outcome: Progressing   Problem: Respiratory: Goal: Ability to maintain adequate ventilation will improve Outcome: Progressing Goal: Ability to maintain a clear airway will improve Outcome: Progressing   Problem: Education: Goal: Knowledge of General Education information will improve Description: Including pain rating scale, medication(s)/side effects and non-pharmacologic comfort measures Outcome: Progressing   Problem: Health Behavior/Discharge Planning: Goal: Ability to manage health-related needs will improve Outcome: Progressing   Problem: Clinical Measurements: Goal: Ability to maintain clinical measurements within normal limits will improve Outcome: Progressing Goal: Will remain free from infection Outcome: Progressing Goal: Diagnostic test results will improve Outcome: Progressing Goal: Respiratory complications will improve Outcome: Progressing Goal: Cardiovascular complication will be avoided Outcome: Progressing   Problem: Activity: Goal: Risk for activity  intolerance will decrease Outcome: Progressing   Problem: Nutrition: Goal: Adequate nutrition will be maintained Outcome: Progressing   Problem: Elimination: Goal: Will not experience complications related to bowel motility Outcome: Progressing Goal: Will not experience complications related to urinary retention Outcome: Progressing   Problem: Coping: Goal: Level of anxiety will decrease Outcome: Progressing   Problem: Pain Managment: Goal: General experience of comfort will improve Outcome: Progressing   Problem: Safety: Goal: Ability to remain free from injury will improve Outcome: Progressing   Problem: Skin Integrity: Goal: Risk for impaired skin integrity will decrease Outcome: Progressing

## 2023-01-08 NOTE — Progress Notes (Signed)
RT note. Patient not requiring bipap at this time. Patient currently turned down to 1 L [place] due to sat at 100% with no labored breathing noted this morning. Will bring bipap to room if patient begins to require. RT will continue to monitor.

## 2023-01-08 NOTE — Progress Notes (Signed)
OT Cancellation Note  Patient Details Name: Brett Vasquez MRN: [id number] DOB: 1946-01-14   Cancelled Treatment:    Reason Eval/Treat Not Completed: Medical issues which prohibited therapy (Pt not seen this day per RN request secondary to pt receiving blood transfusion and pt presenting today with increased agitation and agression requiring 1 on 1 observation. OT to continue to follow pt.)  [name] "Orson Eva., OTR/L, MA Acute Rehab [phone]   [name] 01/08/2023, 3:10 PM

## 2023-01-09 ENCOUNTER — Inpatient Hospital Stay (HOSPITAL_COMMUNITY): Payer: Medicare Other

## 2023-01-09 DIAGNOSIS — J441 Chronic obstructive pulmonary disease with (acute) exacerbation: Secondary | ICD-10-CM | POA: Diagnosis not present

## 2023-01-09 LAB — TYPE AND SCREEN
ABO/RH(D): O POS
Unit division: 0
Unit division: 0

## 2023-01-09 LAB — CBC WITH DIFFERENTIAL/PLATELET
Abs Immature Granulocytes: 0.13 10*3/uL — ABNORMAL HIGH (ref 0.00–0.07)
Basophils Absolute: 0 10*3/uL (ref 0.0–0.1)
Basophils Relative: 0 %
Eosinophils Absolute: 0 10*3/uL (ref 0.0–0.5)
Eosinophils Relative: 0 %
HCT: 33.6 % — ABNORMAL LOW (ref 39.0–52.0)
Hemoglobin: 10.1 g/dL — ABNORMAL LOW (ref 13.0–17.0)
Immature Granulocytes: 2 %
Lymphocytes Relative: 22 %
Lymphs Abs: 1.9 10*3/uL (ref 0.7–4.0)
MCH: 19.8 pg — ABNORMAL LOW (ref 26.0–34.0)
MCHC: 30.1 g/dL (ref 30.0–36.0)
MCV: 65.8 fL — ABNORMAL LOW (ref 80.0–100.0)
Monocytes Absolute: 0.6 10*3/uL (ref 0.1–1.0)
Monocytes Relative: 7 %
Neutro Abs: 6 10*3/uL (ref 1.7–7.7)
Neutrophils Relative %: 69 %
Platelets: 363 10*3/uL (ref 150–400)
RBC: 5.11 MIL/uL (ref 4.22–5.81)
RDW: 23.6 % — ABNORMAL HIGH (ref 11.5–15.5)
Smear Review: ADEQUATE
WBC: 8.7 10*3/uL (ref 4.0–10.5)
nRBC: 0.2 % (ref 0.0–0.2)

## 2023-01-09 LAB — MAGNESIUM: Magnesium: 2.2 mg/dL (ref 1.7–2.4)

## 2023-01-09 LAB — BASIC METABOLIC PANEL
Anion gap: 15 (ref 5–15)
BUN: 16 mg/dL (ref 8–23)
CO2: 28 mmol/L (ref 22–32)
Calcium: 9.2 mg/dL (ref 8.9–10.3)
Chloride: 96 mmol/L — ABNORMAL LOW (ref 98–111)
Creatinine, Ser: 1.06 mg/dL (ref 0.61–1.24)
GFR, Estimated: >60 mL/min (ref >60)
Glucose, Bld: 78 mg/dL (ref 70–99)
Potassium: 3.5 mmol/L (ref 3.5–5.1)
Sodium: 139 mmol/L (ref 135–145)

## 2023-01-09 LAB — PROCALCITONIN: Procalcitonin: 0.46 ng/mL

## 2023-01-09 LAB — BPAM RBC
Blood Product Expiration Date: 202405172359
ISSUE DATE / TIME: 202404251227

## 2023-01-09 LAB — C-REACTIVE PROTEIN: CRP: 0.8 mg/dL (ref <1.0)

## 2023-01-09 LAB — CULTURE, BLOOD (ROUTINE X 2): Culture: NO GROWTH

## 2023-01-09 LAB — BRAIN NATRIURETIC PEPTIDE: B Natriuretic Peptide: 341.3 pg/mL — ABNORMAL HIGH (ref 0.0–100.0)

## 2023-01-09 MED ORDER — POTASSIUM CHLORIDE CRYS ER 20 MEQ PO TBCR
40.0000 meq | EXTENDED_RELEASE_TABLET | Freq: Once | ORAL | Status: AC
  Administered 2023-01-09: 40 meq via ORAL
  Filled 2023-01-09: qty 2

## 2023-01-09 MED ORDER — PREDNISONE 5 MG PO TABS
30.0000 mg | ORAL_TABLET | Freq: Once | ORAL | Status: AC
  Administered 2023-01-09: 30 mg via ORAL
  Filled 2023-01-09: qty 2

## 2023-01-09 MED ORDER — FUROSEMIDE 10 MG/ML IJ SOLN
40.0000 mg | Freq: Once | INTRAMUSCULAR | Status: AC
  Administered 2023-01-09: 40 mg via INTRAVENOUS
  Filled 2023-01-09: qty 4

## 2023-01-09 MED ORDER — SPIRONOLACTONE 25 MG PO TABS
25.0000 mg | ORAL_TABLET | Freq: Once | ORAL | Status: AC
  Administered 2023-01-09: 25 mg via ORAL
  Filled 2023-01-09: qty 1

## 2023-01-09 MED ORDER — ISOSORBIDE MONONITRATE ER 30 MG PO TB24
15.0000 mg | ORAL_TABLET | Freq: Every day | ORAL | Status: DC
  Administered 2023-01-10: 15 mg via ORAL
  Filled 2023-01-09: qty 1

## 2023-01-09 MED ORDER — PREDNISONE 20 MG PO TABS
20.0000 mg | ORAL_TABLET | Freq: Every day | ORAL | Status: DC
  Administered 2023-01-10: 20 mg via ORAL
  Filled 2023-01-09: qty 1

## 2023-01-09 NOTE — Progress Notes (Signed)
PROGRESS NOTE                                                                                                                                                                                                             Brett Vasquez Demographics:    Brett Vasquez, is a 77 y.o. male, DOB - 07/05/1946, UJW:[id number]  Outpatient Primary MD for the Brett Vasquez is Brett Vasquez, No Pcp Per    LOS - 5  Admit date - 01/04/2023    Chief Complaint  Brett Vasquez presents with   Shortness of Breath       Brief Narrative (HPI from H&P)   77 year old with history of COPD, active smoker, hypertension hyperlipidemia presented to the ER with many days of shortness of breath.  In the ER he was diagnosed with acute hypoxic respiratory failure due to combination of pneumonia and COPD exacerbation and admitted to the hospital.   Subjective:   Brett Vasquez in bed, appears comfortable, denies any headache, no fever, no chest pain or pressure, improved shortness of breath , no abdominal pain. No new focal weakness.   Assessment  & Plan :    Acute on chronic hypoxic respiratory failure caused by strep pneumonia community-acquired pneumonia and COPD exacerbation in a Brett Vasquez with underlying COPD and still smoking. He initially required BiPAP, currently on IV steroids, empiric antibiotics along with diuretics, currently off of BiPAP and on 4 L nasal cannula oxygen, overall tenuous strictly counseled to quit alcohol.  Encouraged to sit in chair use I-S and flutter valve for pulmonary toiletry and daytime.  Advance activity titrate down oxygen and monitor cultures.  So far strep pneumo urinary antigen is positive.  Medically , not threatening to leave AMA on 01/09/2023.   Ongoing smoking.  Counseled to quit.  Acute on chronic diastolic CHF EF 60%.  Diuresed with IV Lasix, has edema, added TED stockings, much improved continue intermittent diuresis.  Hypertension.  Currently on  Norvasc, add low-dose Coreg and monitor.  Microcytic anemia.  Present on admission, placed on PPI, anemia panel, placed on oral ferrous sulfate and folic acid, Feraheme, 2 units of packed RBC on 01/08/2023, stable posttransfusion H&H.  Brett Vasquez strictly refuses EGD or colonoscopy says he will follow it up with his physician if needed. Post discharge age-appropriate outpatient iron deficiency anemia workup to be directed by PCP.  AKI.  Resolved after hydration.  Hypokalemia.  Replaced.  Acute metabolic encephalopathy.  As needed Haldol and monitor.  Avoid benzodiazepines and narcotics.  Restraints if needed.  Son Benson Vasquez updated.      Condition - Extremely Guarded  Family Communication  :  son Benson Vasquez [phone] in detail on 01/08/23  Code Status :  DNR  Consults  :  None  PUD Prophylaxis :     Procedures  :     TTE - EF of 60% LVH with moderate AS  CT - 1. Mild right-greater-than-left basilar ground-glass opacities with associated cystic change and small scattered ground-glass nodules. Findings are likely due to infection with associated postinfectious pneumatoceles. Primary lung adenocarcinoma could also have this appearance. Follow-up chest CT is recommended in 3 months to ensure resolution. 2. Mildly enlarged ascending thoracic aorta, measuring up to 4.0 cm. Recommend annual imaging followup by CTA or MRA. This recommendation follows 2010 ACCF/AHA/AATS/ACR/ASA/SCA/SCAI/SIR/STS/SVM Guidelines for the Diagnosis and Management of Patients with Thoracic Aortic Disease.       Disposition Plan  :    Status is: Inpatient   DVT Prophylaxis  :    Place TED hose Start: 01/06/23 1014 enoxaparin (LOVENOX) injection 40 mg Start: 01/04/23 2200     Lab Results  Component Value Date   PLT 363 01/09/2023    Diet :  Diet Order             Diet regular Room service appropriate? Yes; Fluid consistency: Thin  Diet effective now                    Inpatient  Medications  Scheduled Meds:  amLODipine  5 mg Oral Daily   carvedilol  6.25 mg Oral BID WC   docusate sodium  100 mg Oral BID   enoxaparin (LOVENOX) injection  40 mg Subcutaneous Q24H   ferrous sulfate  325 mg Oral BID WC   folic acid  1 mg Oral Daily   furosemide  40 mg Intravenous Once   ipratropium-albuterol  3 mL Nebulization Q6H   isosorbide mononitrate  30 mg Oral Daily   levofloxacin  750 mg Oral Daily   mometasone-formoterol  2 puff Inhalation BID   multivitamin with minerals  1 tablet Oral Daily   nicotine  14 mg Transdermal Daily   pantoprazole  40 mg Oral Daily   potassium chloride  40 mEq Oral Once   [START ON 01/10/2023] predniSONE  20 mg Oral Q breakfast   sodium chloride flush  3 mL Intravenous Q12H   spironolactone  25 mg Oral Once   Continuous Infusions:   PRN Meds:.acetaminophen **OR** acetaminophen, albuterol, bisacodyl, haloperidol lactate, hydrALAZINE, ondansetron **OR** ondansetron (ZOFRAN) IV, oxyCODONE, polyethylene glycol    Objective:   Vitals:   01/08/23 2030 01/09/23 0000 01/09/23 0609 01/09/23 0833  BP: 125/87 114/70 (!) 165/104   Pulse: 86 80 90   Resp: 18 19 20    Temp: 98.2 F (36.8 C) 98.5 F (36.9 C) 98 F (36.7 C)   TempSrc: Oral Axillary Axillary   SpO2: 93% 94% 90% 93%  Weight:      Height:        Wt Readings from Last 3 Encounters:  01/04/23 66.5 kg  06/01/22 66.5 kg  04/04/22 65.8 kg     Intake/Output Summary (Last 24 hours) at 01/09/2023 0940 Last data filed at 01/09/2023 0520 Gross per 24 hour  Intake 314 ml  Output 3225 ml  Net -2911 ml  Physical Exam  Awake Alert, No new F.N deficits, Normal affect [place].AT,PERRAL Supple Neck, No JVD,   Symmetrical Chest wall movement, minimal air movement bilaterally, +ve rales, 1 + leg edema RRR,No Gallops,Rubs or new Murmurs,  +ve B.Sounds, Abd Soft, No tenderness,   No Cyanosis, Clubbing        Data Review:    Recent Labs  Lab 01/04/23 1400 01/04/23 1412  01/05/23 0352 01/06/23 0718 01/07/23 0303 01/08/23 0436 01/09/23 0558  WBC 18.0*  --  17.0* 15.0* 12.6* 8.6 8.7  HGB 7.4*   < > 7.2* 7.7* 7.2* 6.9* 10.1*  HCT 28.2*   < > 26.8* 27.1* 26.7* 24.6* 33.6*  PLT 389  --  306 357 333 329 363  MCV 66.5*  --  65.4* 63.9* 65.3* 63.7* 65.8*  MCH 17.5*  --  17.6* 18.2* 17.6* 17.9* 19.8*  MCHC 26.2*  --  26.9* 28.4* 27.0* 28.0* 30.1  RDW 20.1*  --  19.9* 20.3* 19.9* 19.8* 23.6*  LYMPHSABS 0.5*  --   --  0.8 1.5 1.2 1.9  MONOABS 0.4  --   --  0.5 0.5 0.4 0.6  EOSABS 0.0  --   --  0.0 0.0 0.0 0.0  BASOSABS 0.0  --   --  0.0 0.0 0.0 0.0   < > = values in this interval not displayed.    Recent Labs  Lab 01/04/23 1400 01/04/23 1412 01/05/23 0352 01/06/23 0718 01/07/23 0303 01/08/23 0436 01/09/23 0558  NA 140   < > 139 140 141 142 139  K 2.6*   < > 2.8* 3.2* 2.8* 4.1 3.5  CL 103  --  100 103 99 104 96*  CO2 22  --  25 26 29 24 28   ANIONGAP 15  --  14 11 13 14 15   GLUCOSE 175*  --  130* 100* 88 104* 78  BUN 22  --  19 23 26* 21 16  CREATININE 1.32*  --  0.96 0.81 0.86 0.77 1.06  AST  --   --   --  53*  --   --   --   ALT  --   --   --  31  --   --   --   ALKPHOS  --   --   --  74  --   --   --   BILITOT  --   --   --  0.7  --   --   --   ALBUMIN  --   --   --  3.4*  --   --   --   CRP  --   --   --  6.7* 3.7* 2.0* 0.8  PROCALCITON  --   --   --  3.15 2.23 0.57 0.46  BNP 380.2*  --   --  414.1* 236.7* 392.0* 341.3*  MG  --   --   --  2.1 1.7 1.6* 2.2  CALCIUM 8.4*  --  8.7* 9.0 9.0 8.2* 9.2   < > = values in this interval not displayed.      Recent Labs  Lab 01/04/23 1400 01/05/23 0352 01/06/23 0718 01/07/23 0303 01/08/23 0436 01/09/23 0558  CRP  --   --  6.7* 3.7* 2.0* 0.8  PROCALCITON  --   --  3.15 2.23 0.57 0.46  BNP 380.2*  --  414.1* 236.7* 392.0* 341.3*  MG  --   --  2.1 1.7 1.6* 2.2  CALCIUM 8.4* 8.7*  9.0 9.0 8.2* 9.2     Radiology Reports DG Chest Port 1 View  Result Date: 01/09/2023 CLINICAL DATA:  Shortness  of breath. EXAM: PORTABLE CHEST 1 VIEW COMPARISON:  01/06/2023 FINDINGS: Cardiopericardial silhouette is at upper limits of normal for size. Lungs are hyperexpanded. Interstitial markings are diffusely coarsened with chronic features. Stable left greater than right basilar interstitial and alveolar opacity compatible with the ground-glass opacity seen on chest CT 3 days ago. Telemetry leads overlie the chest. IMPRESSION: No substantial change since 01/06/2023. Hyperexpansion with underlying chronic interstitial changes and left greater than right bibasilar opacities. Electronically Signed   By: Kennith Center M.D.   On: 01/09/2023 07:15   ECHOCARDIOGRAM COMPLETE  Result Date: 01/07/2023    ECHOCARDIOGRAM REPORT   Brett Vasquez Name:   EYTHAN JAYNE Date of Exam: 01/07/2023 Medical Rec #:  [id number]      Height:       66.0 in Accession #:    [id number]     Weight:       146.6 lb Date of Birth:  28-Jul-1946      BSA:          1.753 m Brett Vasquez Age:    21 years       BP:           115/81 mmHg Brett Vasquez Gender: M              HR:           90 bpm. Exam Location:  Inpatient Procedure: 2D Echo, Cardiac Doppler and Color Doppler Indications:    Chest Pain  History:        Brett Vasquez has prior history of Echocardiogram examinations, most                 recent 11/11/2021. COPD and TIA; Risk Factors:Hypertension.  Sonographer:    Wallie Char Referring Phys: [facility]  Sonographer Comments: Technically difficult study due to poor echo windows. IMPRESSIONS  1. Left ventricular ejection fraction, by estimation, is 60 to 65%. The left ventricle has normal function. The left ventricle has no regional wall motion abnormalities. There is mild concentric left ventricular hypertrophy. Left ventricular diastolic function could not be evaluated.  2. Right ventricular systolic function is normal. The right ventricular size is normal.  3. The mitral valve is normal in structure. No evidence of mitral valve regurgitation. No evidence of  mitral stenosis.  4. The aortic valve is tricuspid. Aortic valve regurgitation is moderate. Aortic valve sclerosis/calcification is present, without any evidence of aortic stenosis. Aortic valve area, by VTI measures 3.35 cm. Aortic valve mean gradient measures 3.0 mmHg. Aortic valve Vmax measures 1.21 m/s. FINDINGS  Left Ventricle: Left ventricular ejection fraction, by estimation, is 60 to 65%. The left ventricle has normal function. The left ventricle has no regional wall motion abnormalities. The left ventricular internal cavity size was normal in size. There is  mild concentric left ventricular hypertrophy. Left ventricular diastolic function could not be evaluated. Right Ventricle: The right ventricular size is normal. No increase in right ventricular wall thickness. Right ventricular systolic function is normal. Left Atrium: Left atrial size was not well visualized. Right Atrium: Right atrial size was normal in size. Pericardium: There is no evidence of pericardial effusion. Mitral Valve: The mitral valve is normal in structure. No evidence of mitral valve regurgitation. No evidence of mitral valve stenosis. MV peak gradient, 2.7 mmHg. The mean mitral valve gradient is 2.0 mmHg. Tricuspid Valve: The tricuspid  valve is normal in structure. Tricuspid valve regurgitation is mild . No evidence of tricuspid stenosis. Aortic Valve: The aortic valve is tricuspid. Aortic valve regurgitation is moderate. Aortic valve sclerosis/calcification is present, without any evidence of aortic stenosis. Aortic valve mean gradient measures 3.0 mmHg. Aortic valve peak gradient measures 5.9 mmHg. Aortic valve area, by VTI measures 3.35 cm. Pulmonic Valve: The pulmonic valve was normal in structure. Pulmonic valve regurgitation is trivial. No evidence of pulmonic stenosis. Aorta: The aortic root is normal in size and structure. Venous: The inferior vena cava was not well visualized. IAS/Shunts: No atrial level shunt detected by  color flow Doppler.  LEFT VENTRICLE PLAX 2D LVIDd:         4.60 cm LVIDs:         3.70 cm LV PW:         1.30 cm LV IVS:        1.20 cm LVOT diam:     2.20 cm LV SV:         79 LV SV Index:   45 LVOT Area:     3.80 cm  RIGHT VENTRICLE         IVC TAPSE (M-mode): 3.1 cm  IVC diam: 3.00 cm AORTIC VALVE AV Area (Vmax):    3.93 cm AV Area (Vmean):   3.75 cm AV Area (VTI):     3.35 cm AV Vmax:           121.00 cm/s AV Vmean:          88.000 cm/s AV VTI:            0.235 m AV Peak Grad:      5.9 mmHg AV Mean Grad:      3.0 mmHg LVOT Vmax:         125.00 cm/s LVOT Vmean:        86.800 cm/s LVOT VTI:          0.207 m LVOT/AV VTI ratio: 0.88  AORTA Ao Root diam: 3.50 cm MITRAL VALVE               TRICUSPID VALVE MV Area (PHT): 4.89 cm    TR Peak grad:   48.2 mmHg MV Area VTI:   3.78 cm    TR Vmax:        347.00 cm/s MV Peak grad:  2.7 mmHg MV Mean grad:  2.0 mmHg    SHUNTS MV Vmax:       0.83 m/s    Systemic VTI:  0.21 m MV Vmean:      60.5 cm/s   Systemic Diam: 2.20 cm MV Decel Time: 155 msec MV E velocity: 80.50 cm/s MV A velocity: 88.90 cm/s MV E/A ratio:  0.91 Armanda Magic MD Electronically signed by Armanda Magic MD Signature Date/Time: 01/07/2023/3:32:45 PM    Final    CT CHEST WO CONTRAST  Result Date: 01/06/2023 CLINICAL DATA:  Persistent cough EXAM: CT CHEST WITHOUT CONTRAST TECHNIQUE: Multidetector CT imaging of the chest was performed following the standard protocol without IV contrast. RADIATION DOSE REDUCTION: This exam was performed according to the departmental dose-optimization program which includes automated exposure control, adjustment of the mA and/or kV according to Brett Vasquez size and/or use of iterative reconstruction technique. COMPARISON:  Chest CT dated December 06, 2010 FINDINGS: Cardiovascular: Normal heart size. No pericardial effusion. Mild dilation of the ascending thoracic aorta, measuring up to 4.0 cm. Mild calcified plaque of the thoracic aorta. Moderate coronary artery calcifications.  Mediastinum/Nodes: Esophagus and thyroid are unremarkable. No enlarged lymph nodes seen in the chest. Lungs/Pleura: Central airways are patent. Severe centrilobular emphysema. Mild right-greater-than-left ground-glass opacities with associated mild cystic change and scattered small ground-glass nodules. Reference thin-walled cyst of the right lower lobe measuring 10 x 9 mm on series 4, image 144. No pleural effusion or pneumothorax. Upper Abdomen: No acute abnormality. Musculoskeletal: Degenerative changes of the cervical spine and sternomanubrial joint, unchanged when compared with the prior exam. No aggressive appearing osseous lesions. IMPRESSION: 1. Mild right-greater-than-left basilar ground-glass opacities with associated cystic change and small scattered ground-glass nodules. Findings are likely due to infection with associated postinfectious pneumatoceles. Primary lung adenocarcinoma could also have this appearance. Follow-up chest CT is recommended in 3 months to ensure resolution. 2. Mildly enlarged ascending thoracic aorta, measuring up to 4.0 cm. Recommend annual imaging followup by CTA or MRA. This recommendation follows 2010 ACCF/AHA/AATS/ACR/ASA/SCA/SCAI/SIR/STS/SVM Guidelines for the Diagnosis and Management of Patients with Thoracic Aortic Disease. Circulation. 2010; 121: E454-U981. Aortic aneurysm NOS (ICD10-I71.9) 3. Aortic Atherosclerosis (ICD10-I70.0) and Emphysema (ICD10-J43.9). Electronically Signed   By: Allegra Lai M.D.   On: 01/06/2023 16:17   DG Chest Port 1 View  Result Date: 01/06/2023 CLINICAL DATA:  Shortness of breath.  COPD. EXAM: PORTABLE CHEST 1 VIEW COMPARISON:  01/04/2023 FINDINGS: Heart size and mediastinal contours are stable. Lungs are hyperinflated. Advanced changes of emphysema. Similar appearance of left basilar scarring with surrounding architectural distortion and slight asymmetric elevation of the left hemidiaphragm. No superimposed pleural effusion or  interstitial edema. Subtle opacity scratch set superimposed opacity within the left base may represent atelectasis or pneumonia. Visualized osseous structures are unremarkable. IMPRESSION: 1. Subtle opacity within the left base may represent atelectasis or pneumonia. 2. Advanced changes of emphysema. 3. Chronic left basilar scarring. Electronically Signed   By: Signa Kell M.D.   On: 01/06/2023 08:26      Signature  -   Susa Raring M.D on 01/09/2023 at 9:40 AM   -  To page go to [email or web address]

## 2023-01-09 NOTE — Progress Notes (Signed)
SATURATION QUALIFICATIONS: (This note is used to comply with regulatory documentation for home oxygen)  Patient Saturations on Room Air at Rest = 88%  Patient Saturations on Room Air while Ambulating = 86%  Patient Saturations on 2 Liters of oxygen while Ambulating = 94%   

## 2023-01-09 NOTE — Progress Notes (Signed)
Physical Therapy Treatment Patient Details Name: Brett Vasquez MRN: [id number] DOB: [birth year] Today's Date: 01/09/2023   History of Present Illness 77 y.o. male [facility] on 01/04/2023 with SOB. Pt admitted for chronic respiratory failure associate with COPD exacerbation and LLL PNA. PMH: chronic iron deficiency anemia secondary to GI bleed, COPD, HTN, HLD, coronary hypokalemia, cigarette smoke    PT Comments    Pt tolerated today's session well, ambulating on 1L O2 [place] with minG for safety and with use of RW. Pt ambulated last session with no AD but with 1 LOB, balance noted to be improved with use of RW. Pt's cognition also seems to be improved from yesterday based on chart review. Pt continues to be limited by balance and activity tolerance, acute PT will continue to follow to address deficits. Recommend HHPT upon discharge with RW. Will follow as appropriate.     Recommendations for follow up therapy are one component of a multi-disciplinary discharge planning process, led by the attending physician.  Recommendations may be updated based on patient status, additional functional criteria and insurance authorization.  Follow Up Recommendations       Assistance Recommended at Discharge PRN  Patient can return home with the following A little help with bathing/dressing/bathroom;Assistance with cooking/housework;Help with stairs or ramp for entrance;A little help with walking and/or transfers   Equipment Recommendations  Rolling walker (2 wheels)    Recommendations for Other Services       Precautions / Restrictions Precautions Precautions: Fall;Other (comment) Precaution Comments: watch O2 Restrictions Weight Bearing Restrictions: No     Mobility  Bed Mobility Overal bed mobility: Modified Independent             General bed mobility comments: HOB elevated with use of bedrail    Transfers Overall transfer level: Needs assistance Equipment used: Rolling  walker (2 wheels) Transfers: Sit to/from Stand Sit to Stand: Supervision           General transfer comment: standing with RW and mild cueing for proper hand placement    Ambulation/Gait Ambulation/Gait assistance: Min guard Gait Distance (Feet): 75 Feet (with 1 standing rest break) Assistive device: Rolling walker (2 wheels) Gait Pattern/deviations: Step-through pattern, Drifts right/left, Decreased stride length, Trunk flexed Gait velocity: decreased     General Gait Details: balance improved with RW, mild cueing for posture. Satting 92% or greater with one standing rest break   Social research officer, government Rankin (Stroke Patients Only)       Balance Overall balance assessment: Needs assistance Sitting-balance support: No upper extremity supported, Feet supported Sitting balance-Leahy Scale: Good     Standing balance support: During functional activity, Bilateral upper extremity supported Standing balance-Leahy Scale: Fair                              Cognition Arousal/Alertness: Awake/alert Behavior During Therapy: WFL for tasks assessed/performed Overall Cognitive Status: Within Functional Limits for tasks assessed                                 General Comments: Per chart review, pt with impaired cognition, seems improved during today's session and with discussion with RN        Exercises      General Comments General comments (skin integrity, edema, etc.):  92% or greater SPO2 on 1L O2 [place]      Pertinent Vitals/Pain Pain Assessment Pain Assessment: No/denies pain    Home Living                          Prior Function            PT Goals (current goals can now be found in the care plan section) Acute Rehab PT Goals Patient Stated Goal: to improve activity tolerance PT Goal Formulation: With patient Time For Goal Achievement: 01/19/23 Potential to Achieve Goals: Good Progress  towards PT goals: Progressing toward goals    Frequency    Min 3X/week      PT Plan Discharge plan needs to be updated    Co-evaluation              AM-PAC PT "6 Clicks" Mobility   Outcome Measure  Help needed turning from your back to your side while in a flat bed without using bedrails?: None Help needed moving from lying on your back to sitting on the side of a flat bed without using bedrails?: None Help needed moving to and from a bed to a chair (including a wheelchair)?: A Little Help needed standing up from a chair using your arms (e.g., wheelchair or bedside chair)?: A Little Help needed to walk in hospital room?: A Little Help needed climbing 3-5 steps with a railing? : A Little 6 Click Score: 20    End of Session Equipment Utilized During Treatment: Oxygen Activity Tolerance: Patient limited by fatigue Patient left: with call bell/phone within reach;in bed;with bed alarm set Nurse Communication: Mobility status PT Visit Diagnosis: Other abnormalities of gait and mobility (R26.89)     Time: 9604-5409 PT Time Calculation (min) (ACUTE ONLY): 17 min  Charges:  $Gait Training: 8-22 mins                     Brett Vasquez, PT DPT Acute Rehabilitation Services Office [phone]    Brett Vasquez 01/09/2023, 2:01 PM

## 2023-01-09 NOTE — TOC Progression Note (Signed)
Transition of Care [facility]) - Progression Note    Patient Details  Name: Brett Vasquez MRN: [id number] Date of Birth: 02/08/1946  Transition of Care [facility] CM/SW Contact  Gordy Clement, RN Phone Number: 01/09/2023, 4:32 PM  Clinical Narrative:     CM has ordered recommended O2, shower chair and rolling walker from [employer] (choice)  Will be delivered on Saturday. Patient will now DC to home with [facility] PT and OT have been recommended and [facility] will provide PT and OT   No additional TOC needs anticipated       Expected Discharge Plan: Home/Self Care Barriers to Discharge: Continued Medical Work up  Expected Discharge Plan and Services In-house Referral: PCP / [facility]   Post Acute Care Choice: Durable Medical Equipment                   DME Arranged: Shower stool, Oxygen (O2 needs TBD) DME Agency: [employer] Date DME Agency Contacted: 01/07/23 Time DME Agency Contacted: 1128 Representative spoke with at DME Agency: Vaughan Basta HH Arranged: NA HH Agency: NA         Social Determinants of Health (SDOH) Interventions SDOH Screenings   Food Insecurity: No Food Insecurity (01/04/2023)  Housing: Low Risk  (01/04/2023)  Transportation Needs: No Transportation Needs (01/04/2023)  Utilities: Not At Risk (01/04/2023)  Depression (PHQ2-9): Low Risk  (10/09/2020)  Tobacco Use: High Risk (01/08/2023)    Readmission Risk Interventions     No data to display

## 2023-01-10 DIAGNOSIS — J441 Chronic obstructive pulmonary disease with (acute) exacerbation: Secondary | ICD-10-CM | POA: Diagnosis not present

## 2023-01-10 LAB — CULTURE, BLOOD (ROUTINE X 2)

## 2023-01-10 MED ORDER — IPRATROPIUM-ALBUTEROL 0.5-2.5 (3) MG/3ML IN SOLN: 3.0000 mL | Freq: Four times a day (QID) | RESPIRATORY_TRACT | Status: DC | PRN

## 2023-01-10 MED ORDER — METHYLPREDNISOLONE 4 MG PO TBPK: ORAL_TABLET | ORAL | 0 refills | Status: AC

## 2023-01-10 MED ORDER — FOLIC ACID 1 MG PO TABS: 1.0000 mg | ORAL_TABLET | Freq: Every day | ORAL | 0 refills | Status: AC

## 2023-01-10 MED ORDER — ALBUTEROL SULFATE HFA 108 (90 BASE) MCG/ACT IN AERS: 2.0000 | INHALATION_SPRAY | Freq: Four times a day (QID) | RESPIRATORY_TRACT | 0 refills | Status: AC | PRN

## 2023-01-10 MED ORDER — LEVOFLOXACIN 750 MG PO TABS: 750.0000 mg | ORAL_TABLET | Freq: Every day | ORAL | 0 refills | Status: AC

## 2023-01-10 MED ORDER — ISOSORBIDE MONONITRATE ER 30 MG PO TB24: 15.0000 mg | ORAL_TABLET | Freq: Every day | ORAL | 0 refills | Status: AC

## 2023-01-10 MED ORDER — NICOTINE 14 MG/24HR TD PT24: 14.0000 mg | MEDICATED_PATCH | Freq: Every day | TRANSDERMAL | 0 refills | Status: AC

## 2023-01-10 MED ORDER — BUDESONIDE-FORMOTEROL FUMARATE 80-4.5 MCG/ACT IN AERO
2.0000 | INHALATION_SPRAY | Freq: Two times a day (BID) | RESPIRATORY_TRACT | 0 refills | Status: AC
Start: 2023-01-10 — End: ?

## 2023-01-10 MED ORDER — IPRATROPIUM-ALBUTEROL 0.5-2.5 (3) MG/3ML IN SOLN: RESPIRATORY_TRACT | 0 refills | Status: AC

## 2023-01-10 MED ORDER — FERROUS SULFATE 325 (65 FE) MG PO TABS: 325.0000 mg | ORAL_TABLET | Freq: Every day | ORAL | 0 refills | Status: AC

## 2023-01-10 MED ORDER — PANTOPRAZOLE SODIUM 40 MG PO TBEC: 40.0000 mg | DELAYED_RELEASE_TABLET | Freq: Every day | ORAL | 0 refills | Status: AC

## 2023-01-10 MED ORDER — CARVEDILOL 6.25 MG PO TABS: 6.2500 mg | ORAL_TABLET | Freq: Two times a day (BID) | ORAL | 0 refills | Status: AC

## 2023-01-10 MED ORDER — HYDROCHLOROTHIAZIDE 25 MG PO TABS: 25.0000 mg | ORAL_TABLET | Freq: Every day | ORAL | 0 refills | Status: AC

## 2023-01-10 NOTE — Plan of Care (Signed)
  Problem: Coping: Goal: Level of anxiety will decrease 01/10/2023 1020 by Teniola Tseng, Caren Griffins, RN Outcome: Adequate for Discharge 01/10/2023 1019 by Mae Cianci, Caren Griffins, RN Outcome: Adequate for Discharge

## 2023-01-10 NOTE — Discharge Summary (Signed)
Brett Vasquez XBJ:[id number] DOB: 07/30/1946 DOA: 01/04/2023  PCP: Patient, No Pcp Per  Admit date: 01/04/2023  Discharge date: 01/10/2023  Admitted From: Home   Disposition:  Home   Recommendations for Outpatient Follow-up:   Follow up with PCP in 1-2 weeks  PCP Please obtain BMP/CBC, 2 view CXR in 1week,  (see Discharge instructions)   PCP Please follow up on the following pending results: Get CBC, BMP, magnesium and two-view chest x-ray done in 7 to 10 days.  You need outpatient workup for iron deficiency anemia including colonoscopy and EGD, discuss again with your PCP.   Home Health: PT, RN   Equipment/Devices: as below  Consultations: None  Discharge Condition: Stable    CODE STATUS: Full    Diet Recommendation: Heart Healthy     Chief Complaint  Patient presents with   Shortness of Breath     Brief history of present illness from the day of admission and additional interim summary    77 year old with history of COPD, active smoker, hypertension hyperlipidemia presented to the ER with many days of shortness of breath. In the ER he was diagnosed with acute hypoxic respiratory failure due to combination of pneumonia and COPD exacerbation and admitted to the hospital.                                                                  Hospital Course   Acute on chronic hypoxic respiratory failure caused by strep pneumonia community-acquired pneumonia and COPD exacerbation in a patient with underlying COPD and still smoking. He initially required BiPAP, currently on IV steroids, empiric antibiotics along with diuretics, initially required BiPAP but now down to 1 L nasal cannula at rest upon ambulation requires 2 L nasal cannula at times, completely symptom-free, he will be discharged on a steroid taper short  course along with 3 more days of oral Levaquin, clinically he is back to baseline.  Eager to go home.  Nebulizer treatments and home oxygen provided, home RN and PT if he qualifies as well.  Requested to follow-up with PCP within a week.   Ongoing smoking.  Counseled to quit.   Acute on chronic diastolic CHF EF 60%.  Mild improved after few doses of Lasix, will be discharged on HCTZ, PCP to monitor electrolytes and fluid status closely.   Hypertension.  He was placed on Coreg and Imdur combination stable.   Microcytic anemia.  Present on admission, placed on PPI, anemia panel, placed on oral ferrous sulfate and folic acid, Feraheme, 2 units of packed RBC on 01/08/2023, stable posttransfusion H&H.  Patient strictly refuses EGD or colonoscopy says he will follow it up with his physician if needed. Post discharge age-appropriate outpatient iron deficiency anemia workup to be directed by PCP.  Will be discharged on oral  iron, folic acid and PPI.  CP to monitor.   AKI.  Resolved after hydration.   Hypokalemia.  Replaced and stable.   Acute metabolic encephalopathy.  Much improved with Haldol, close to baseline will be discharged home plan was discussed with patient's son bedside on 01/09/2023 patient has good supervision at home and family agreeable to take him home.    Discharge diagnosis     Principal Problem:   COPD with acute exacerbation (HCC) Active Problems:   Tobacco use disorder   Essential hypertension   Left lower lobe pneumonia   AKI (acute kidney injury) (HCC)   DNR (do not resuscitate)    Discharge instructions    Discharge Instructions     Diet - low sodium heart healthy   Complete by: As directed    Discharge instructions   Complete by: As directed    Follow with Primary MD in 7 days   Get CBC, CMP, 2 view Chest X ray -  checked next visit with your primary MD   Activity: As tolerated with Full fall precautions use walker/cane & assistance as needed  Disposition  Home   Diet: Heart Healthy   Special Instructions: If you have smoked or chewed Tobacco  in the last 2 yrs please stop smoking, stop any regular Alcohol  and or any Recreational drug use.  On your next visit with your primary care physician please Get Medicines reviewed and adjusted.  Please request your Prim.MD to go over all Hospital Tests and Procedure/Radiological results at the follow up, please get all Hospital records sent to your Prim MD by signing hospital release before you go home.  If you experience worsening of your admission symptoms, develop shortness of breath, life threatening emergency, suicidal or homicidal thoughts you must seek medical attention immediately by calling 911 or calling your MD immediately  if symptoms less severe.   For home use only DME Nebulizer machine   Complete by: As directed    Patient needs a nebulizer to treat with the following condition: COPD (chronic obstructive pulmonary disease) (HCC)   Length of Need: 12 Months   Increase activity slowly   Complete by: As directed        Discharge Medications   Allergies as of 01/10/2023   No Known Allergies      Medication List     STOP taking these medications    amLODipine 5 MG tablet Commonly known as: NORVASC   budesonide-formoterol 160-4.5 MCG/ACT inhaler Commonly known as: Symbicort Replaced by: budesonide-formoterol 80-4.5 MCG/ACT inhaler   lisinopril-hydrochlorothiazide 10-12.5 MG tablet Commonly known as: ZESTORETIC   losartan 50 MG tablet Commonly known as: COZAAR   predniSONE 10 MG tablet Commonly known as: DELTASONE       TAKE these medications    albuterol 108 (90 Base) MCG/ACT inhaler Commonly known as: VENTOLIN HFA Inhale 2 puffs into the lungs every 6 (six) hours as needed for wheezing or shortness of breath.   budesonide-formoterol 80-4.5 MCG/ACT inhaler Commonly known as: Symbicort Inhale 2 puffs into the lungs 2 (two) times daily. INHALE 2 PUFFS INTO LUNGS  TWICE A DAY Replaces: budesonide-formoterol 160-4.5 MCG/ACT inhaler   carvedilol 6.25 MG tablet Commonly known as: COREG Take 1 tablet (6.25 mg total) by mouth 2 (two) times daily with a meal.   ferrous sulfate 325 (65 FE) MG tablet Take 1 tablet (325 mg total) by mouth daily with breakfast.   folic acid 1 MG tablet Commonly known as: FOLVITE Take 1 tablet (  1 mg total) by mouth daily.   hydrochlorothiazide 25 MG tablet Commonly known as: HYDRODIURIL Take 1 tablet (25 mg total) by mouth daily.   ipratropium-albuterol 0.5-2.5 (3) MG/3ML Soln Commonly known as: DUONEB Use twice a day scheduled and every 4 hours as needed for shortness of breath and wheezing   isosorbide mononitrate 30 MG 24 hr tablet Commonly known as: IMDUR Take 0.5 tablets (15 mg total) by mouth daily.   levofloxacin 750 MG tablet Commonly known as: LEVAQUIN Take 1 tablet (750 mg total) by mouth daily.   meloxicam 7.5 MG tablet Commonly known as: MOBIC Take 7.5 mg by mouth at bedtime.   methylPREDNISolone 4 MG Tbpk tablet Commonly known as: MEDROL DOSEPAK follow package directions   nicotine 14 mg/24hr patch Commonly known as: NICODERM CQ - dosed in mg/24 hours Place 1 patch (14 mg total) onto the skin daily.   pantoprazole 40 MG tablet Commonly known as: PROTONIX Take 1 tablet (40 mg total) by mouth daily.               Durable Medical Equipment  (From admission, onward)           Start     Ordered   01/10/23 0000  For home use only DME Nebulizer machine       Question Answer Comment  Patient needs a nebulizer to treat with the following condition COPD (chronic obstructive pulmonary disease) (HCC)   Length of Need 12 Months      01/10/23 0843   01/09/23 1039  For home use only DME Walker rolling  Once       Comments: 5 wheel  Question Answer Comment  Walker: With 5 Inch Wheels   Patient needs a walker to treat with the following condition Weakness      01/09/23 1038    01/09/23 1039  For home use only DME oxygen  Once       Question Answer Comment  Length of Need 6 Months   Mode or (Route) Nasal cannula   Liters per Minute 2   Frequency Continuous (stationary and portable oxygen unit needed)   Oxygen conserving device Yes   Oxygen delivery system Gas      01/09/23 1038             Follow-up Information     [facility] Follow up.   Why: [employer] will be providing your shower chair. They will also provide your oxygen (if needed) Contact information: [address]  [address]  [place] [address]  [phone]        Steffanie Rainwater, MD Follow up.   Specialty: Internal Medicine Why: TIME: 1:15 PM DATE: MAY 1 ,2024 LOCATION: [facility], [facility] FLOOR [address] [place], [place] [address] Contact information: [address] [place] [address] [phone]         [facility], [facility] Follow up.   Specialty: Home Health Services Why: [facility] will provide your Home Health PT and OT. They will contact you usually within 48 hours of discharge. Please call them if you have not heard within this time frame Contact information: [address] [place] [address] [phone]                 Major procedures and Radiology Reports - PLEASE review detailed and final reports thoroughly  -        DG Chest [facility] 1 View  Result Date: 01/09/2023 CLINICAL DATA:  Shortness of breath. EXAM: PORTABLE CHEST 1 VIEW COMPARISON:  01/06/2023 FINDINGS: Cardiopericardial silhouette is at upper limits of normal for size. Lungs are hyperexpanded. Interstitial markings are diffusely coarsened with chronic features. Stable left greater than right basilar interstitial and alveolar opacity compatible with the ground-glass opacity seen on chest CT 3 days ago. Telemetry leads overlie the chest. IMPRESSION: No substantial change since 01/06/2023. Hyperexpansion with underlying chronic interstitial changes and  left greater than right bibasilar opacities. Electronically Signed   By: Kennith Center M.D.   On: 01/09/2023 07:15   ECHOCARDIOGRAM COMPLETE  Result Date: 01/07/2023    ECHOCARDIOGRAM REPORT   Patient Name:   AVIN GIBBONS Date of Exam: 01/07/2023 Medical Rec #:  [id number]      Height:       66.0 in Accession #:    [id number]     Weight:       146.6 lb Date of Birth:  07-05-1946      BSA:          1.753 m Patient Age:    77 years       BP:           115/81 mmHg Patient Gender: M              HR:           90 bpm. Exam Location:  Inpatient Procedure: 2D Echo, Cardiac Doppler and Color Doppler Indications:    Chest Pain  History:        Patient has prior history of Echocardiogram examinations, most                 recent 11/11/2021. COPD and TIA; Risk Factors:Hypertension.  Sonographer:    Wallie Char Referring Phys: [facility]  Sonographer Comments: Technically difficult study due to poor echo windows. IMPRESSIONS  1. Left ventricular ejection fraction, by estimation, is 60 to 65%. The left ventricle has normal function. The left ventricle has no regional wall motion abnormalities. There is mild concentric left ventricular hypertrophy. Left ventricular diastolic function could not be evaluated.  2. Right ventricular systolic function is normal. The right ventricular size is normal.  3. The mitral valve is normal in structure. No evidence of mitral valve regurgitation. No evidence of mitral stenosis.  4. The aortic valve is tricuspid. Aortic valve regurgitation is moderate. Aortic valve sclerosis/calcification is present, without any evidence of aortic stenosis. Aortic valve area, by VTI measures 3.35 cm. Aortic valve mean gradient measures 3.0 mmHg. Aortic valve Vmax measures 1.21 m/s. FINDINGS  Left Ventricle: Left ventricular ejection fraction, by estimation, is 60 to 65%. The left ventricle has normal function. The left ventricle has no regional wall motion abnormalities. The left ventricular internal  cavity size was normal in size. There is  mild concentric left ventricular hypertrophy. Left ventricular diastolic function could not be evaluated. Right Ventricle: The right ventricular size is normal. No increase in right ventricular wall thickness. Right ventricular systolic function is normal. Left Atrium: Left atrial size was not well visualized. Right Atrium: Right atrial size was normal in size. Pericardium: There is no evidence of pericardial effusion. Mitral Valve: The mitral valve is normal in structure. No evidence of mitral valve regurgitation. No evidence of mitral valve stenosis. MV peak gradient, 2.7 mmHg. The mean mitral valve gradient is 2.0 mmHg. Tricuspid Valve: The tricuspid valve is normal in structure. Tricuspid valve regurgitation is mild . No evidence of tricuspid stenosis. Aortic Valve: The aortic valve is  tricuspid. Aortic valve regurgitation is moderate. Aortic valve sclerosis/calcification is present, without any evidence of aortic stenosis. Aortic valve mean gradient measures 3.0 mmHg. Aortic valve peak gradient measures 5.9 mmHg. Aortic valve area, by VTI measures 3.35 cm. Pulmonic Valve: The pulmonic valve was normal in structure. Pulmonic valve regurgitation is trivial. No evidence of pulmonic stenosis. Aorta: The aortic root is normal in size and structure. Venous: The inferior vena cava was not well visualized. IAS/Shunts: No atrial level shunt detected by color flow Doppler.  LEFT VENTRICLE PLAX 2D LVIDd:         4.60 cm LVIDs:         3.70 cm LV PW:         1.30 cm LV IVS:        1.20 cm LVOT diam:     2.20 cm LV SV:         79 LV SV Index:   45 LVOT Area:     3.80 cm  RIGHT VENTRICLE         IVC TAPSE (M-mode): 3.1 cm  IVC diam: 3.00 cm AORTIC VALVE AV Area (Vmax):    3.93 cm AV Area (Vmean):   3.75 cm AV Area (VTI):     3.35 cm AV Vmax:           121.00 cm/s AV Vmean:          88.000 cm/s AV VTI:            0.235 m AV Peak Grad:      5.9 mmHg AV Mean Grad:      3.0 mmHg  LVOT Vmax:         125.00 cm/s LVOT Vmean:        86.800 cm/s LVOT VTI:          0.207 m LVOT/AV VTI ratio: 0.88  AORTA Ao Root diam: 3.50 cm MITRAL VALVE               TRICUSPID VALVE MV Area (PHT): 4.89 cm    TR Peak grad:   48.2 mmHg MV Area VTI:   3.78 cm    TR Vmax:        347.00 cm/s MV Peak grad:  2.7 mmHg MV Mean grad:  2.0 mmHg    SHUNTS MV Vmax:       0.83 m/s    Systemic VTI:  0.21 m MV Vmean:      60.5 cm/s   Systemic Diam: 2.20 cm MV Decel Time: 155 msec MV E velocity: 80.50 cm/s MV A velocity: 88.90 cm/s MV E/A ratio:  0.91 Armanda Magic MD Electronically signed by Armanda Magic MD Signature Date/Time: 01/07/2023/3:32:45 PM    Final    CT CHEST WO CONTRAST  Result Date: 01/06/2023 CLINICAL DATA:  Persistent cough EXAM: CT CHEST WITHOUT CONTRAST TECHNIQUE: Multidetector CT imaging of the chest was performed following the standard protocol without IV contrast. RADIATION DOSE REDUCTION: This exam was performed according to the departmental dose-optimization program which includes automated exposure control, adjustment of the mA and/or kV according to patient size and/or use of iterative reconstruction technique. COMPARISON:  Chest CT dated December 06, 2010 FINDINGS: Cardiovascular: Normal heart size. No pericardial effusion. Mild dilation of the ascending thoracic aorta, measuring up to 4.0 cm. Mild calcified plaque of the thoracic aorta. Moderate coronary artery calcifications. Mediastinum/Nodes: Esophagus and thyroid are unremarkable. No enlarged lymph nodes seen in the chest. Lungs/Pleura: Central airways are patent. Severe centrilobular emphysema. Mild  right-greater-than-left ground-glass opacities with associated mild cystic change and scattered small ground-glass nodules. Reference thin-walled cyst of the right lower lobe measuring 10 x 9 mm on series 4, image 144. No pleural effusion or pneumothorax. Upper Abdomen: No acute abnormality. Musculoskeletal: Degenerative changes of the cervical spine  and sternomanubrial joint, unchanged when compared with the prior exam. No aggressive appearing osseous lesions. IMPRESSION: 1. Mild right-greater-than-left basilar ground-glass opacities with associated cystic change and small scattered ground-glass nodules. Findings are likely due to infection with associated postinfectious pneumatoceles. Primary lung adenocarcinoma could also have this appearance. Follow-up chest CT is recommended in 3 months to ensure resolution. 2. Mildly enlarged ascending thoracic aorta, measuring up to 4.0 cm. Recommend annual imaging followup by CTA or MRA. This recommendation follows 2010 ACCF/AHA/AATS/ACR/ASA/SCA/SCAI/SIR/STS/SVM Guidelines for the Diagnosis and Management of Patients with Thoracic Aortic Disease. Circulation. 2010; 121: R604-V409. Aortic aneurysm NOS (ICD10-I71.9) 3. Aortic Atherosclerosis (ICD10-I70.0) and Emphysema (ICD10-J43.9). Electronically Signed   By: Allegra Lai M.D.   On: 01/06/2023 16:17   DG Chest Port 1 View  Result Date: 01/06/2023 CLINICAL DATA:  Shortness of breath.  COPD. EXAM: PORTABLE CHEST 1 VIEW COMPARISON:  01/04/2023 FINDINGS: Heart size and mediastinal contours are stable. Lungs are hyperinflated. Advanced changes of emphysema. Similar appearance of left basilar scarring with surrounding architectural distortion and slight asymmetric elevation of the left hemidiaphragm. No superimposed pleural effusion or interstitial edema. Subtle opacity scratch set superimposed opacity within the left base may represent atelectasis or pneumonia. Visualized osseous structures are unremarkable. IMPRESSION: 1. Subtle opacity within the left base may represent atelectasis or pneumonia. 2. Advanced changes of emphysema. 3. Chronic left basilar scarring. Electronically Signed   By: Signa Kell M.D.   On: 01/06/2023 08:26   DG Chest Port 1 View  Result Date: 01/04/2023 CLINICAL DATA:  Dyspnea.  Shortness of breath EXAM: PORTABLE CHEST 1 VIEW  COMPARISON:  X-ray 01/03/2023 and older FINDINGS: Hyperinflation. No pneumothorax or effusion. Chronic lung changes. There is some asymmetric increased density at the left lung base. A subtle acute process is possible recommend follow-up. Borderline cardiopericardial silhouette. Tortuous ectatic aorta. Overlapping cardiac leads. IMPRESSION: Subtle opacity left lung base. An acute process is possible. Recommend follow-up. Hyperinflation with underlying chronic lung disease Electronically Signed   By: Karen Kays M.D.   On: 01/04/2023 13:49   DG Chest 2 View  Result Date: 01/03/2023 CLINICAL DATA:  77 year old male with shortness of breath. EXAM: CHEST - 2 VIEW COMPARISON:  Chest radiographs 05/29/2022 and earlier. FINDINGS: Chronically tortuous aortic arch and emphysema demonstrated on prior CTA. Lung volumes and mediastinal contours are stable from last year. Visualized tracheal air column is within normal limits. No pneumothorax, pulmonary edema, pleural effusion or acute pulmonary opacity. No acute osseous abnormality identified. Negative visible bowel gas. IMPRESSION: 1. No acute cardiopulmonary abnormality. 2. Chronic Emphysema (ICD10-J43.9), tortuous aortic arch. Electronically Signed   By: Odessa Fleming M.D.   On: 01/03/2023 11:51    Micro Results    Recent Results (from the past 240 hour(s))  Culture, blood (routine x 2) Call MD if unable to obtain prior to antibiotics being given     Status: None   Collection Time: 01/05/23  3:52 AM   Specimen: BLOOD  Result Value Ref Range Status   Specimen Description BLOOD SITE NOT SPECIFIED  Final   Special Requests   Final    BOTTLES DRAWN AEROBIC AND ANAEROBIC Blood Culture adequate volume   Culture   Final    NO  GROWTH 5 DAYS Performed at [facility], [address] [place], [place] [address]    Report Status 01/10/2023 FINAL  Final  Culture, blood (routine x 2) Call MD if unable to obtain prior to antibiotics being given     Status: None    Collection Time: 01/05/23  3:53 AM   Specimen: BLOOD  Result Value Ref Range Status   Specimen Description BLOOD SITE NOT SPECIFIED  Final   Special Requests   Final    BOTTLES DRAWN AEROBIC AND ANAEROBIC Blood Culture adequate volume   Culture   Final    NO GROWTH 5 DAYS Performed at [facility], [address] [place], [place] [address]    Report Status 01/10/2023 FINAL  Final    Today   Subjective    Brett Vasquez today has no headache,no chest abdominal pain,no new weakness tingling or numbness, feels much better wants to go home today.    Objective   Blood pressure 130/89, pulse 87, temperature 97.7 F (36.5 C), temperature source Oral, resp. rate 17, height 5\' 6"  (1.676 m), weight 66.5 kg, SpO2 96 %.   Intake/Output Summary (Last 24 hours) at 01/10/2023 0845 Last data filed at 01/10/2023 0521 Gross per 24 hour  Intake 120 ml  Output 1700 ml  Net -1580 ml    Exam  Awake Alert, No new F.N deficits,    [place].AT,PERRAL Supple Neck,   Symmetrical Chest wall movement, Good air movement bilaterally, CTAB RRR,No Gallops,   +ve B.Sounds, Abd Soft, Non tender,  No Cyanosis, Clubbing or edema    Data Review   Recent Labs  Lab 01/04/23 1400 01/04/23 1412 01/05/23 0352 01/06/23 0718 01/07/23 0303 01/08/23 0436 01/09/23 0558  WBC 18.0*  --  17.0* 15.0* 12.6* 8.6 8.7  HGB 7.4*   < > 7.2* 7.7* 7.2* 6.9* 10.1*  HCT 28.2*   < > 26.8* 27.1* 26.7* 24.6* 33.6*  PLT 389  --  306 357 333 329 363  MCV 66.5*  --  65.4* 63.9* 65.3* 63.7* 65.8*  MCH 17.5*  --  17.6* 18.2* 17.6* 17.9* 19.8*  MCHC 26.2*  --  26.9* 28.4* 27.0* 28.0* 30.1  RDW 20.1*  --  19.9* 20.3* 19.9* 19.8* 23.6*  LYMPHSABS 0.5*  --   --  0.8 1.5 1.2 1.9  MONOABS 0.4  --   --  0.5 0.5 0.4 0.6  EOSABS 0.0  --   --  0.0 0.0 0.0 0.0  BASOSABS 0.0  --   --  0.0 0.0 0.0 0.0   < > = values in this interval not displayed.    Recent Labs  Lab 01/04/23 1400 01/04/23 1412 01/05/23 0352 01/06/23 0718  01/07/23 0303 01/08/23 0436 01/09/23 0558  NA 140   < > 139 140 141 142 139  K 2.6*   < > 2.8* 3.2* 2.8* 4.1 3.5  CL 103  --  100 103 99 104 96*  CO2 22  --  25 26 29 24 28   ANIONGAP 15  --  14 11 13 14 15   GLUCOSE 175*  --  130* 100* 88 104* 78  BUN 22  --  19 23 26* 21 16  CREATININE 1.32*  --  0.96 0.81 0.86 0.77 1.06  AST  --   --   --  53*  --   --   --   ALT  --   --   --  31  --   --   --  ALKPHOS  --   --   --  37  --   --   --   BILITOT  --   --   --  0.7  --   --   --   ALBUMIN  --   --   --  3.4*  --   --   --   CRP  --   --   --  6.7* 3.7* 2.0* 0.8  PROCALCITON  --   --   --  3.15 2.23 0.57 0.46  BNP 380.2*  --   --  414.1* 236.7* 392.0* 341.3*  MG  --   --   --  2.1 1.7 1.6* 2.2  CALCIUM 8.4*  --  8.7* 9.0 9.0 8.2* 9.2   < > = values in this interval not displayed.    Total Time in preparing paper work, data evaluation and todays exam - 35 minutes  Signature  -    Susa Raring M.D on 01/10/2023 at 8:45 AM   -  To page go to [email or web address]

## 2023-01-10 NOTE — Plan of Care (Signed)
  Problem: Education: Goal: Knowledge of disease or condition will improve Outcome: Adequate for Discharge   

## 2023-01-10 NOTE — Progress Notes (Signed)
Mobility Specialist - Progress Note   01/10/23 1036  Mobility  Activity Dangled on edge of bed  Level of Assistance Standby assist, set-up cues, supervision of patient - no hands on  Assistive Device None  Activity Response Tolerated fair  Mobility Referral Yes  $Mobility charge 1 Mobility   Pt was received in bed. Pr expressed irritation with wanting to get his oxygen tank and go home. Pt requesting assistance to put on gown. Pt was able to come EOB independently. Pt refused any OOB activity at this time. Pt was returned to supine with all needs met and bed alarm on.   Brett Vasquez  Mobility Specialist Please contact via Special educational needs teacher or Rehab office at [phone]

## 2023-01-10 NOTE — Discharge Instructions (Signed)
Follow with Primary MD in 7 days   Get CBC, CMP, 2 view Chest X ray -  checked next visit with your primary MD   Activity: As tolerated with Full fall precautions use walker/cane & assistance as needed  Disposition Home   Diet: Heart Healthy   Special Instructions: If you have smoked or chewed Tobacco  in the last 2 yrs please stop smoking, stop any regular Alcohol  and or any Recreational drug use.  On your next visit with your primary care physician please Get Medicines reviewed and adjusted.  Please request your Prim.MD to go over all Hospital Tests and Procedure/Radiological results at the follow up, please get all Hospital records sent to your Prim MD by signing hospital release before you go home.  If you experience worsening of your admission symptoms, develop shortness of breath, life threatening emergency, suicidal or homicidal thoughts you must seek medical attention immediately by calling 911 or calling your MD immediately  if symptoms less severe.

## 2023-01-10 NOTE — Progress Notes (Deleted)
Nsg Discharge Note  Admit Date:  01/04/2023 Discharge date: 01/10/2023   Delsa Bern to be D/C'd Home per MD order.  AVS completed. Patient/caregiver able to verbalize understanding.  Discharge Medication: Allergies as of 01/10/2023   No Known Allergies      Medication List     STOP taking these medications    amLODipine 5 MG tablet Commonly known as: NORVASC   budesonide-formoterol 160-4.5 MCG/ACT inhaler Commonly known as: Symbicort Replaced by: budesonide-formoterol 80-4.5 MCG/ACT inhaler   lisinopril-hydrochlorothiazide 10-12.5 MG tablet Commonly known as: ZESTORETIC   losartan 50 MG tablet Commonly known as: COZAAR   predniSONE 10 MG tablet Commonly known as: DELTASONE       TAKE these medications    albuterol 108 (90 Base) MCG/ACT inhaler Commonly known as: VENTOLIN HFA Inhale 2 puffs into the lungs every 6 (six) hours as needed for wheezing or shortness of breath.   budesonide-formoterol 80-4.5 MCG/ACT inhaler Commonly known as: Symbicort Inhale 2 puffs into the lungs 2 (two) times daily. INHALE 2 PUFFS INTO LUNGS TWICE A DAY Replaces: budesonide-formoterol 160-4.5 MCG/ACT inhaler   carvedilol 6.25 MG tablet Commonly known as: COREG Take 1 tablet (6.25 mg total) by mouth 2 (two) times daily with a meal.   ferrous sulfate 325 (65 FE) MG tablet Take 1 tablet (325 mg total) by mouth daily with breakfast.   folic acid 1 MG tablet Commonly known as: FOLVITE Take 1 tablet (1 mg total) by mouth daily.   hydrochlorothiazide 25 MG tablet Commonly known as: HYDRODIURIL Take 1 tablet (25 mg total) by mouth daily.   ipratropium-albuterol 0.5-2.5 (3) MG/3ML Soln Commonly known as: DUONEB Use twice a day scheduled and every 4 hours as needed for shortness of breath and wheezing   isosorbide mononitrate 30 MG 24 hr tablet Commonly known as: IMDUR Take 0.5 tablets (15 mg total) by mouth daily.   levofloxacin 750 MG tablet Commonly known as:  LEVAQUIN Take 1 tablet (750 mg total) by mouth daily.   meloxicam 7.5 MG tablet Commonly known as: MOBIC Take 7.5 mg by mouth at bedtime.   methylPREDNISolone 4 MG Tbpk tablet Commonly known as: MEDROL DOSEPAK follow package directions   nicotine 14 mg/24hr patch Commonly known as: NICODERM CQ - dosed in mg/24 hours Place 1 patch (14 mg total) onto the skin daily.   pantoprazole 40 MG tablet Commonly known as: PROTONIX Take 1 tablet (40 mg total) by mouth daily.               Durable Medical Equipment  (From admission, onward)           Start     Ordered   01/10/23 0000  For home use only DME Nebulizer machine       Question Answer Comment  Patient needs a nebulizer to treat with the following condition COPD (chronic obstructive pulmonary disease) (HCC)   Length of Need 12 Months      01/10/23 0843   01/09/23 1039  For home use only DME Walker rolling  Once       Comments: 5 wheel  Question Answer Comment  Walker: With 5 Inch Wheels   Patient needs a walker to treat with the following condition Weakness      01/09/23 1038   01/09/23 1039  For home use only DME oxygen  Once       Question Answer Comment  Length of Need 6 Months   Mode or (Route) Nasal cannula   Liters  per Minute 2   Frequency Continuous (stationary and portable oxygen unit needed)   Oxygen conserving device Yes   Oxygen delivery system Gas      01/09/23 1038            Discharge Assessment: Vitals:   01/10/23 0753 01/10/23 0910  BP:  121/78  Pulse:    Resp:  16  Temp:  98 F (36.7 C)  SpO2: 96%    Skin clean, dry and intact without evidence of skin break down, no evidence of skin tears noted. IV catheter discontinued intact. Site without signs and symptoms of complications - no redness or edema noted at insertion site, patient denies c/o pain - only slight tenderness at site.  Dressing with slight pressure applied.  D/c Instructions-Education: Discharge instructions given  to patient/family with verbalized understanding. D/c education completed with patient/family including follow up instructions, medication list, d/c activities limitations if indicated, with other d/c instructions as indicated by MD - patient able to verbalize understanding, all questions fully answered. Patient instructed to return to ED, call 911, or call MD for any changes in condition.  Patient escorted via WC, and D/C home via private auto.  Keyairra Kolinski, Tilford Pillar, RN 01/10/2023 12:03 PM

## 2023-01-10 NOTE — TOC Transition Note (Addendum)
Transition of Care [facility]) - CM/SW Discharge Note   Patient Details  Name: Brett Vasquez MRN: [id number] Date of Birth: [birth year]  Transition of Care [facility]) CM/SW Contact:  Lawerance Sabal, RN Phone Number: 01/10/2023, 8:45 AM   Clinical Narrative:     Rondel Jumbo of [place]. DME O2, shower chair and rolling walker from [employer] is to be delivered to the room today. Spoke to D[place]ur and we will add a nebulizer to DME being delivered to the room today Brother will transport home   Final next level of care: Home/Self Care Barriers to Discharge: Continued Medical Work up   Patient Goals and CMS Choice CMS Medicare.gov Compare Post Acute Care list provided to:: Patient Choice offered to / list presented to : Patient  Discharge Placement                         Discharge Plan and Services Additional resources added to the After Visit Summary for   In-house Referral: PCP / Health Connect   Post Acute Care Choice: Durable Medical Equipment          DME Arranged: Shower stool, Oxygen (O2 needs TBD) DME Agency: [employer] Date DME Agency Contacted: 01/07/23 Time DME Agency Contacted: [id number] Representative spoke with at DME Agency: Vaughan Basta HH Arranged: NA HH Agency: [facility] Date [facility] Agency Contacted: 01/10/23 Time HH Agency Contacted: 0845 Representative spoke with at [facility] Agency: Kandee Keen  Social Determinants of Health (SDOH) Interventions SDOH Screenings   Food Insecurity: No Food Insecurity (01/04/2023)  Housing: Low Risk  (01/04/2023)  Transportation Needs: No Transportation Needs (01/04/2023)  Utilities: Not At Risk (01/04/2023)  Depression (PHQ2-9): Low Risk  (10/09/2020)  Tobacco Use: High Risk (01/08/2023)     Readmission Risk Interventions     No data to display

## 2023-01-10 NOTE — Plan of Care (Signed)
  Problem: Activity: Goal: Ability to tolerate increased activity will improve Outcome: Progressing   

## 2023-01-11 DIAGNOSIS — N179 Acute kidney failure, unspecified: Secondary | ICD-10-CM | POA: Diagnosis not present

## 2023-01-11 DIAGNOSIS — F17200 Nicotine dependence, unspecified, uncomplicated: Secondary | ICD-10-CM | POA: Diagnosis not present

## 2023-01-11 DIAGNOSIS — J441 Chronic obstructive pulmonary disease with (acute) exacerbation: Secondary | ICD-10-CM | POA: Diagnosis not present

## 2023-01-11 DIAGNOSIS — I10 Essential (primary) hypertension: Secondary | ICD-10-CM | POA: Diagnosis not present

## 2023-01-14 ENCOUNTER — Ambulatory Visit: Payer: No Typology Code available for payment source | Admitting: Student

## 2023-02-10 DIAGNOSIS — F17200 Nicotine dependence, unspecified, uncomplicated: Secondary | ICD-10-CM | POA: Diagnosis not present

## 2023-02-10 DIAGNOSIS — J441 Chronic obstructive pulmonary disease with (acute) exacerbation: Secondary | ICD-10-CM | POA: Diagnosis not present

## 2023-02-10 DIAGNOSIS — I10 Essential (primary) hypertension: Secondary | ICD-10-CM | POA: Diagnosis not present

## 2023-02-10 DIAGNOSIS — N179 Acute kidney failure, unspecified: Secondary | ICD-10-CM | POA: Diagnosis not present

## 2023-02-13 ENCOUNTER — Other Ambulatory Visit (HOSPITAL_COMMUNITY): Payer: No Typology Code available for payment source

## 2023-03-13 DIAGNOSIS — F17200 Nicotine dependence, unspecified, uncomplicated: Secondary | ICD-10-CM | POA: Diagnosis not present

## 2023-03-13 DIAGNOSIS — I10 Essential (primary) hypertension: Secondary | ICD-10-CM | POA: Diagnosis not present

## 2023-03-13 DIAGNOSIS — N179 Acute kidney failure, unspecified: Secondary | ICD-10-CM | POA: Diagnosis not present

## 2023-03-13 DIAGNOSIS — J441 Chronic obstructive pulmonary disease with (acute) exacerbation: Secondary | ICD-10-CM | POA: Diagnosis not present

## 2023-04-12 DIAGNOSIS — F17200 Nicotine dependence, unspecified, uncomplicated: Secondary | ICD-10-CM | POA: Diagnosis not present

## 2023-04-12 DIAGNOSIS — I10 Essential (primary) hypertension: Secondary | ICD-10-CM | POA: Diagnosis not present

## 2023-04-12 DIAGNOSIS — J441 Chronic obstructive pulmonary disease with (acute) exacerbation: Secondary | ICD-10-CM | POA: Diagnosis not present

## 2023-04-12 DIAGNOSIS — N179 Acute kidney failure, unspecified: Secondary | ICD-10-CM | POA: Diagnosis not present

## 2023-05-13 DIAGNOSIS — J441 Chronic obstructive pulmonary disease with (acute) exacerbation: Secondary | ICD-10-CM | POA: Diagnosis not present

## 2023-05-13 DIAGNOSIS — N179 Acute kidney failure, unspecified: Secondary | ICD-10-CM | POA: Diagnosis not present

## 2023-05-13 DIAGNOSIS — I10 Essential (primary) hypertension: Secondary | ICD-10-CM | POA: Diagnosis not present

## 2023-05-13 DIAGNOSIS — F17200 Nicotine dependence, unspecified, uncomplicated: Secondary | ICD-10-CM | POA: Diagnosis not present

## 2023-07-19 ENCOUNTER — Other Ambulatory Visit: Payer: Self-pay | Admitting: Family Medicine

## 2023-07-19 DIAGNOSIS — J439 Emphysema, unspecified: Secondary | ICD-10-CM

## 2023-07-20 DIAGNOSIS — I10 Essential (primary) hypertension: Secondary | ICD-10-CM | POA: Diagnosis not present

## 2023-07-20 NOTE — Telephone Encounter (Signed)
Requested medication (s) are due for refill today: yes  Requested medication (s) are on the active medication list: no  Last refill:  06/24/22 ended 01/09/22  Future visit scheduled: no  Notes to clinic:  has not had a visit- pt may have been dismissed   Requested Prescriptions  Pending Prescriptions Disp Refills   SYMBICORT 160-4.5 MCG/ACT inhaler [Pharmacy Med Name: SYMBICORT 160-4.5 MCG INHALER] 10.2 each 18    Sig: INHALE 2 PUFFS INTO LUNGS TWICE A DAY     Pulmonology:  Combination Products Failed - 07/19/2023  1:12 AM      Failed - Valid encounter within last 12 months    Recent Outpatient Visits           2 years ago Benign essential HTN   [facility] Donita Brooks, MD   2 years ago Benign essential HTN   [facility] Tanya Nones, Priscille Heidelberg, MD   5 years ago Colon cancer screening   [facility] Donita Brooks, MD   5 years ago Benign essential HTN   [facility] Donita Brooks, MD   7 years ago Benign essential HTN   [facility] Pickard, Priscille Heidelberg, MD

## 2024-01-07 DIAGNOSIS — R21 Rash and other nonspecific skin eruption: Secondary | ICD-10-CM | POA: Diagnosis not present

## 2024-08-15 DEATH — deceased
